# Patient Record
Sex: Male | Born: 1960 | Hispanic: Yes | Smoking: Never smoker
Health system: Southern US, Community
[De-identification: ages and names within clinical notes are randomized; demographics above are authoritative.]

## PROBLEM LIST (undated history)

## (undated) DIAGNOSIS — Z9289 Personal history of other medical treatment: Secondary | ICD-10-CM

## (undated) DIAGNOSIS — N183 Chronic kidney disease, stage 3 unspecified: Secondary | ICD-10-CM

## (undated) DIAGNOSIS — J9819 Other pulmonary collapse: Secondary | ICD-10-CM

## (undated) DIAGNOSIS — I4901 Ventricular fibrillation: Secondary | ICD-10-CM

## (undated) DIAGNOSIS — I82409 Acute embolism and thrombosis of unspecified deep veins of unspecified lower extremity: Secondary | ICD-10-CM

## (undated) DIAGNOSIS — I219 Acute myocardial infarction, unspecified: Secondary | ICD-10-CM

## (undated) DIAGNOSIS — N189 Chronic kidney disease, unspecified: Secondary | ICD-10-CM

## (undated) DIAGNOSIS — I472 Ventricular tachycardia, unspecified: Secondary | ICD-10-CM

## (undated) DIAGNOSIS — G43909 Migraine, unspecified, not intractable, without status migrainosus: Secondary | ICD-10-CM

## (undated) DIAGNOSIS — R011 Cardiac murmur, unspecified: Secondary | ICD-10-CM

## (undated) DIAGNOSIS — I2510 Atherosclerotic heart disease of native coronary artery without angina pectoris: Secondary | ICD-10-CM

## (undated) DIAGNOSIS — G4730 Sleep apnea, unspecified: Secondary | ICD-10-CM

## (undated) DIAGNOSIS — I422 Other hypertrophic cardiomyopathy: Secondary | ICD-10-CM

## (undated) DIAGNOSIS — R197 Diarrhea, unspecified: Secondary | ICD-10-CM

## (undated) DIAGNOSIS — E781 Pure hyperglyceridemia: Secondary | ICD-10-CM

## (undated) DIAGNOSIS — J189 Pneumonia, unspecified organism: Secondary | ICD-10-CM

## (undated) DIAGNOSIS — T4145XA Adverse effect of unspecified anesthetic, initial encounter: Secondary | ICD-10-CM

## (undated) DIAGNOSIS — R51 Headache: Secondary | ICD-10-CM

## (undated) DIAGNOSIS — A6920 Lyme disease, unspecified: Secondary | ICD-10-CM

## (undated) DIAGNOSIS — I5030 Unspecified diastolic (congestive) heart failure: Secondary | ICD-10-CM

## (undated) DIAGNOSIS — S46211A Strain of muscle, fascia and tendon of other parts of biceps, right arm, initial encounter: Secondary | ICD-10-CM

## (undated) DIAGNOSIS — D751 Secondary polycythemia: Secondary | ICD-10-CM

## (undated) DIAGNOSIS — M109 Gout, unspecified: Secondary | ICD-10-CM

## (undated) DIAGNOSIS — Z95810 Presence of automatic (implantable) cardiac defibrillator: Secondary | ICD-10-CM

## (undated) DIAGNOSIS — K219 Gastro-esophageal reflux disease without esophagitis: Secondary | ICD-10-CM

## (undated) DIAGNOSIS — R4020 Unspecified coma: Secondary | ICD-10-CM

## (undated) DIAGNOSIS — T8859XA Other complications of anesthesia, initial encounter: Secondary | ICD-10-CM

## (undated) HISTORY — DX: Secondary polycythemia: D75.1

## (undated) HISTORY — DX: Chronic kidney disease, unspecified: N18.9

## (undated) HISTORY — DX: Pure hyperglyceridemia: E78.1

## (undated) HISTORY — DX: Ventricular tachycardia, unspecified: I47.20

## (undated) HISTORY — DX: Diarrhea, unspecified: R19.7

## (undated) HISTORY — PX: ABDOMINAL HERNIA REPAIR: SHX539

## (undated) HISTORY — DX: Unspecified diastolic (congestive) heart failure: I50.30

## (undated) HISTORY — PX: CARDIAC DEFIBRILLATOR PLACEMENT: SHX171

## (undated) HISTORY — DX: Ventricular tachycardia: I47.2

## (undated) HISTORY — PX: MYOMECTOMY: SHX85

## (undated) HISTORY — PX: HERNIA REPAIR: SHX51

---

## 1985-11-14 DIAGNOSIS — A6920 Lyme disease, unspecified: Secondary | ICD-10-CM

## 1985-11-14 HISTORY — DX: Lyme disease, unspecified: A69.20

## 1989-11-14 DIAGNOSIS — J9819 Other pulmonary collapse: Secondary | ICD-10-CM

## 1989-11-14 DIAGNOSIS — R4020 Unspecified coma: Secondary | ICD-10-CM

## 1989-11-14 DIAGNOSIS — Z9289 Personal history of other medical treatment: Secondary | ICD-10-CM

## 1989-11-14 HISTORY — PX: NEPHRECTOMY: SHX65

## 1989-11-14 HISTORY — DX: Other pulmonary collapse: J98.19

## 1989-11-14 HISTORY — PX: COLOSTOMY: SHX63

## 1989-11-14 HISTORY — PX: EXPLORATORY LAPAROTOMY: SUR591

## 1989-11-14 HISTORY — PX: PARTIAL COLECTOMY: SHX5273

## 1989-11-14 HISTORY — DX: Personal history of other medical treatment: Z92.89

## 1989-11-14 HISTORY — DX: Unspecified coma: R40.20

## 1990-07-15 HISTORY — PX: COLOSTOMY REVERSAL: SHX5782

## 1993-11-14 DIAGNOSIS — J189 Pneumonia, unspecified organism: Secondary | ICD-10-CM

## 1993-11-14 HISTORY — DX: Pneumonia, unspecified organism: J18.9

## 1997-11-14 DIAGNOSIS — S46211A Strain of muscle, fascia and tendon of other parts of biceps, right arm, initial encounter: Secondary | ICD-10-CM

## 1997-11-14 HISTORY — DX: Strain of muscle, fascia and tendon of other parts of biceps, right arm, initial encounter: S46.211A

## 2004-11-14 DIAGNOSIS — I219 Acute myocardial infarction, unspecified: Secondary | ICD-10-CM

## 2004-11-14 HISTORY — DX: Acute myocardial infarction, unspecified: I21.9

## 2005-12-13 ENCOUNTER — Emergency Department (HOSPITAL_COMMUNITY): Admission: EM | Admit: 2005-12-13 | Discharge: 2005-12-13 | Payer: Self-pay | Admitting: Emergency Medicine

## 2009-04-26 ENCOUNTER — Emergency Department (HOSPITAL_COMMUNITY): Admission: EM | Admit: 2009-04-26 | Discharge: 2009-04-26 | Payer: Self-pay | Admitting: Emergency Medicine

## 2009-05-16 ENCOUNTER — Encounter: Payer: Self-pay | Admitting: Emergency Medicine

## 2009-05-16 ENCOUNTER — Observation Stay (HOSPITAL_COMMUNITY): Admission: EM | Admit: 2009-05-16 | Discharge: 2009-05-18 | Payer: Self-pay | Admitting: Emergency Medicine

## 2009-05-17 ENCOUNTER — Encounter (INDEPENDENT_AMBULATORY_CARE_PROVIDER_SITE_OTHER): Payer: Self-pay | Admitting: Interventional Cardiology

## 2009-05-20 ENCOUNTER — Inpatient Hospital Stay (HOSPITAL_COMMUNITY): Admission: EM | Admit: 2009-05-20 | Discharge: 2009-05-22 | Payer: Self-pay | Admitting: Emergency Medicine

## 2009-06-08 ENCOUNTER — Emergency Department (HOSPITAL_COMMUNITY): Admission: EM | Admit: 2009-06-08 | Discharge: 2009-06-08 | Payer: Self-pay | Admitting: Emergency Medicine

## 2009-06-14 ENCOUNTER — Ambulatory Visit: Payer: Self-pay | Admitting: Cardiology

## 2009-06-14 ENCOUNTER — Inpatient Hospital Stay: Payer: Self-pay | Admitting: Internal Medicine

## 2009-06-15 ENCOUNTER — Encounter: Payer: Self-pay | Admitting: Cardiology

## 2009-06-18 ENCOUNTER — Encounter: Payer: Self-pay | Admitting: Cardiology

## 2009-06-27 DIAGNOSIS — I5032 Chronic diastolic (congestive) heart failure: Secondary | ICD-10-CM | POA: Insufficient documentation

## 2009-06-27 DIAGNOSIS — E781 Pure hyperglyceridemia: Secondary | ICD-10-CM | POA: Insufficient documentation

## 2009-06-27 DIAGNOSIS — I10 Essential (primary) hypertension: Secondary | ICD-10-CM

## 2009-07-07 ENCOUNTER — Emergency Department (HOSPITAL_COMMUNITY): Admission: EM | Admit: 2009-07-07 | Discharge: 2009-07-07 | Payer: Self-pay | Admitting: Emergency Medicine

## 2009-10-11 ENCOUNTER — Emergency Department (HOSPITAL_COMMUNITY): Admission: EM | Admit: 2009-10-11 | Discharge: 2009-10-11 | Payer: Self-pay | Admitting: Emergency Medicine

## 2009-10-27 ENCOUNTER — Inpatient Hospital Stay: Payer: Self-pay | Admitting: Internal Medicine

## 2009-10-27 ENCOUNTER — Ambulatory Visit: Payer: Self-pay | Admitting: Cardiovascular Disease

## 2010-08-26 ENCOUNTER — Emergency Department (HOSPITAL_COMMUNITY): Admission: EM | Admit: 2010-08-26 | Discharge: 2010-08-26 | Payer: Self-pay | Admitting: Emergency Medicine

## 2010-08-29 ENCOUNTER — Emergency Department (HOSPITAL_COMMUNITY): Admission: EM | Admit: 2010-08-29 | Discharge: 2010-08-29 | Payer: Self-pay | Admitting: Emergency Medicine

## 2010-11-14 LAB — HM COLONOSCOPY: HM Colonoscopy: NORMAL

## 2011-01-26 LAB — DIFFERENTIAL
Basophils Relative: 0 % (ref 0–1)
Eosinophils Absolute: 0.1 10*3/uL (ref 0.0–0.7)
Eosinophils Relative: 2 % (ref 0–5)
Monocytes Absolute: 0.3 10*3/uL (ref 0.1–1.0)
Monocytes Relative: 6 % (ref 3–12)

## 2011-01-26 LAB — URINALYSIS, ROUTINE W REFLEX MICROSCOPIC
Bilirubin Urine: NEGATIVE
Glucose, UA: NEGATIVE mg/dL
Ketones, ur: NEGATIVE mg/dL
Nitrite: NEGATIVE
Protein, ur: NEGATIVE mg/dL
pH: 5.5 (ref 5.0–8.0)

## 2011-01-26 LAB — CBC
HCT: 51.5 % (ref 39.0–52.0)
Hemoglobin: 18.4 g/dL — ABNORMAL HIGH (ref 13.0–17.0)
MCH: 38.1 pg — ABNORMAL HIGH (ref 26.0–34.0)
MCHC: 35.8 g/dL (ref 30.0–36.0)
RDW: 11.8 % (ref 11.5–15.5)

## 2011-01-26 LAB — BASIC METABOLIC PANEL
BUN: 14 mg/dL (ref 6–23)
CO2: 23 mEq/L (ref 19–32)
Glucose, Bld: 114 mg/dL — ABNORMAL HIGH (ref 70–99)
Potassium: 4.1 mEq/L (ref 3.5–5.1)
Sodium: 138 mEq/L (ref 135–145)

## 2011-01-30 ENCOUNTER — Emergency Department (HOSPITAL_COMMUNITY)
Admission: EM | Admit: 2011-01-30 | Discharge: 2011-01-30 | Disposition: A | Discharge status: Nursing Home (Includes ICF) | Payer: Managed Care, Other (non HMO) | Attending: Emergency Medicine | Admitting: Emergency Medicine

## 2011-01-30 ENCOUNTER — Inpatient Hospital Stay (HOSPITAL_COMMUNITY)
Admission: EM | Admit: 2011-01-30 | Discharge: 2011-02-02 | DRG: 287 | Disposition: A | Discharge status: Home / Self Care | Payer: Medicare Other | Source: Other Acute Inpatient Hospital | Attending: Interventional Cardiology | Admitting: Interventional Cardiology

## 2011-01-30 ENCOUNTER — Emergency Department (HOSPITAL_COMMUNITY): Payer: Managed Care, Other (non HMO)

## 2011-01-30 DIAGNOSIS — R0609 Other forms of dyspnea: Secondary | ICD-10-CM

## 2011-01-30 DIAGNOSIS — T380X5A Adverse effect of glucocorticoids and synthetic analogues, initial encounter: Secondary | ICD-10-CM | POA: Diagnosis present

## 2011-01-30 DIAGNOSIS — Z880 Allergy status to penicillin: Secondary | ICD-10-CM

## 2011-01-30 DIAGNOSIS — I509 Heart failure, unspecified: Secondary | ICD-10-CM | POA: Diagnosis present

## 2011-01-30 DIAGNOSIS — I5032 Chronic diastolic (congestive) heart failure: Secondary | ICD-10-CM | POA: Diagnosis present

## 2011-01-30 DIAGNOSIS — R0602 Shortness of breath: Secondary | ICD-10-CM | POA: Insufficient documentation

## 2011-01-30 DIAGNOSIS — I422 Other hypertrophic cardiomyopathy: Principal | ICD-10-CM | POA: Diagnosis present

## 2011-01-30 DIAGNOSIS — R197 Diarrhea, unspecified: Secondary | ICD-10-CM | POA: Insufficient documentation

## 2011-01-30 DIAGNOSIS — Z95810 Presence of automatic (implantable) cardiac defibrillator: Secondary | ICD-10-CM

## 2011-01-30 DIAGNOSIS — R079 Chest pain, unspecified: Secondary | ICD-10-CM

## 2011-01-30 DIAGNOSIS — R0789 Other chest pain: Secondary | ICD-10-CM | POA: Insufficient documentation

## 2011-01-30 DIAGNOSIS — I214 Non-ST elevation (NSTEMI) myocardial infarction: Secondary | ICD-10-CM | POA: Insufficient documentation

## 2011-01-30 DIAGNOSIS — E781 Pure hyperglyceridemia: Secondary | ICD-10-CM | POA: Diagnosis present

## 2011-01-30 DIAGNOSIS — N189 Chronic kidney disease, unspecified: Secondary | ICD-10-CM | POA: Diagnosis present

## 2011-01-30 DIAGNOSIS — R0989 Other specified symptoms and signs involving the circulatory and respiratory systems: Secondary | ICD-10-CM

## 2011-01-30 DIAGNOSIS — R110 Nausea: Secondary | ICD-10-CM | POA: Insufficient documentation

## 2011-01-30 DIAGNOSIS — Z905 Acquired absence of kidney: Secondary | ICD-10-CM

## 2011-01-30 DIAGNOSIS — I2510 Atherosclerotic heart disease of native coronary artery without angina pectoris: Secondary | ICD-10-CM | POA: Insufficient documentation

## 2011-01-30 DIAGNOSIS — I129 Hypertensive chronic kidney disease with stage 1 through stage 4 chronic kidney disease, or unspecified chronic kidney disease: Secondary | ICD-10-CM | POA: Diagnosis present

## 2011-01-30 LAB — COMPREHENSIVE METABOLIC PANEL
AST: 35 U/L (ref 0–37)
Albumin: 3.9 g/dL (ref 3.5–5.2)
Calcium: 8.9 mg/dL (ref 8.4–10.5)
Chloride: 107 mEq/L (ref 96–112)
Creatinine, Ser: 1.57 mg/dL — ABNORMAL HIGH (ref 0.4–1.5)
GFR calc Af Amer: 57 mL/min — ABNORMAL LOW (ref >60)

## 2011-01-30 LAB — DIFFERENTIAL
Basophils Absolute: 0 10*3/uL (ref 0.0–0.1)
Eosinophils Absolute: 0 10*3/uL (ref 0.0–0.7)
Lymphs Abs: 1.1 10*3/uL (ref 0.7–4.0)
Monocytes Absolute: 0.5 10*3/uL (ref 0.1–1.0)
Neutro Abs: 3 10*3/uL (ref 1.7–7.7)

## 2011-01-30 LAB — CARDIAC PANEL(CRET KIN+CKTOT+MB+TROPI): Relative Index: 3.4 — ABNORMAL HIGH (ref 0.0–2.5)

## 2011-01-30 LAB — BRAIN NATRIURETIC PEPTIDE: Pro B Natriuretic peptide (BNP): 246 pg/mL — ABNORMAL HIGH (ref 0.0–100.0)

## 2011-01-30 LAB — POCT CARDIAC MARKERS
CKMB, poc: 9.6 ng/mL (ref 1.0–8.0)
Myoglobin, poc: 266 ng/mL (ref 12–200)

## 2011-01-30 LAB — URINALYSIS, ROUTINE W REFLEX MICROSCOPIC
Glucose, UA: NEGATIVE mg/dL
Ketones, ur: NEGATIVE mg/dL
Leukocytes, UA: NEGATIVE
pH: 6.5 (ref 5.0–8.0)

## 2011-01-30 LAB — CBC
Hemoglobin: 16.4 g/dL (ref 13.0–17.0)
MCH: 40 pg — ABNORMAL HIGH (ref 26.0–34.0)
MCHC: 36 g/dL (ref 30.0–36.0)
RDW: 12.1 % (ref 11.5–15.5)

## 2011-01-30 LAB — URINE MICROSCOPIC-ADD ON

## 2011-01-30 LAB — PROTIME-INR: INR: 0.96 (ref 0.00–1.49)

## 2011-01-30 LAB — CK TOTAL AND CKMB (NOT AT ARMC): Relative Index: 3.3 — ABNORMAL HIGH (ref 0.0–2.5)

## 2011-01-31 LAB — CARDIAC PANEL(CRET KIN+CKTOT+MB+TROPI)
CK, MB: 19.1 ng/mL (ref 0.3–4.0)
CK, MB: 21.5 ng/mL (ref 0.3–4.0)
Relative Index: 3.7 — ABNORMAL HIGH (ref 0.0–2.5)
Relative Index: 4.5 — ABNORMAL HIGH (ref 0.0–2.5)
Total CK: 522 U/L — ABNORMAL HIGH (ref 7–232)
Total CK: 479 U/L — ABNORMAL HIGH (ref 7–232)

## 2011-01-31 LAB — HEPARIN LEVEL (UNFRACTIONATED): Heparin Unfractionated: 0.11 IU/mL — ABNORMAL LOW (ref 0.30–0.70)

## 2011-01-31 LAB — CBC
HCT: 43.5 % (ref 39.0–52.0)
Hemoglobin: 15.9 g/dL (ref 13.0–17.0)
MCH: 39.8 pg — ABNORMAL HIGH (ref 26.0–34.0)
MCHC: 36.6 g/dL — ABNORMAL HIGH (ref 30.0–36.0)
MCV: 108.8 fL — ABNORMAL HIGH (ref 78.0–100.0)
Platelets: 92 K/uL — ABNORMAL LOW (ref 150–400)
RBC: 4 MIL/uL — ABNORMAL LOW (ref 4.22–5.81)
RDW: 11.7 % (ref 11.5–15.5)
WBC: 4.3 K/uL (ref 4.0–10.5)

## 2011-01-31 LAB — URINE CULTURE
Colony Count: NO GROWTH
Culture  Setup Time: 201203182018
Culture: NO GROWTH

## 2011-01-31 LAB — MRSA PCR SCREENING: MRSA by PCR: NEGATIVE

## 2011-01-31 LAB — BASIC METABOLIC PANEL
Chloride: 109 mEq/L (ref 96–112)
GFR calc Af Amer: 58 mL/min — ABNORMAL LOW (ref >60)
Potassium: 3.7 mEq/L (ref 3.5–5.1)
Sodium: 137 mEq/L (ref 135–145)

## 2011-01-31 LAB — LIPID PANEL
HDL: 21 mg/dL — ABNORMAL LOW
Total CHOL/HDL Ratio: 7.6 ratio
Triglycerides: 223 mg/dL — ABNORMAL HIGH
VLDL: 45 mg/dL — ABNORMAL HIGH (ref 0–40)

## 2011-01-31 LAB — VITAMIN B12: Vitamin B-12: 320 pg/mL (ref 211–911)

## 2011-02-01 LAB — CK TOTAL AND CKMB (NOT AT ARMC)
CK, MB: 15.4 ng/mL (ref 0.3–4.0)
Relative Index: 6.5 — ABNORMAL HIGH (ref 0.0–2.5)

## 2011-02-01 LAB — BASIC METABOLIC PANEL
Calcium: 8.9 mg/dL (ref 8.4–10.5)
GFR calc Af Amer: 57 mL/min — ABNORMAL LOW (ref >60)
GFR calc non Af Amer: 47 mL/min — ABNORMAL LOW (ref >60)
Glucose, Bld: 172 mg/dL — ABNORMAL HIGH (ref 70–99)
Potassium: 3.8 mEq/L (ref 3.5–5.1)
Sodium: 135 mEq/L (ref 135–145)

## 2011-02-01 LAB — TROPONIN I: Troponin I: 0.11 ng/mL — ABNORMAL HIGH (ref 0.00–0.06)

## 2011-02-01 LAB — CBC
HCT: 46.3 % (ref 39.0–52.0)
Hemoglobin: 16.8 g/dL (ref 13.0–17.0)
RDW: 11.5 % (ref 11.5–15.5)
WBC: 8.1 10*3/uL (ref 4.0–10.5)

## 2011-02-02 LAB — BASIC METABOLIC PANEL
BUN: 16 mg/dL (ref 6–23)
CO2: 30 mEq/L (ref 19–32)
Calcium: 9 mg/dL (ref 8.4–10.5)
Creatinine, Ser: 1.54 mg/dL — ABNORMAL HIGH (ref 0.4–1.5)
GFR calc non Af Amer: 48 mL/min — ABNORMAL LOW (ref >60)
Glucose, Bld: 86 mg/dL (ref 70–99)
Sodium: 140 mEq/L (ref 135–145)

## 2011-02-02 LAB — CK TOTAL AND CKMB (NOT AT ARMC): Relative Index: 9.3 — ABNORMAL HIGH (ref 0.0–2.5)

## 2011-02-16 LAB — DIFFERENTIAL
Basophils Absolute: 0 10*3/uL (ref 0.0–0.1)
Eosinophils Relative: 3 % (ref 0–5)
Lymphocytes Relative: 35 % (ref 12–46)
Lymphs Abs: 1.3 10*3/uL (ref 0.7–4.0)
Monocytes Absolute: 0.2 10*3/uL (ref 0.1–1.0)
Monocytes Relative: 6 % (ref 3–12)
Neutro Abs: 2.2 10*3/uL (ref 1.7–7.7)

## 2011-02-16 LAB — COMPREHENSIVE METABOLIC PANEL
AST: 48 U/L — ABNORMAL HIGH (ref 0–37)
Albumin: 4.1 g/dL (ref 3.5–5.2)
BUN: 26 mg/dL — ABNORMAL HIGH (ref 6–23)
Calcium: 9.1 mg/dL (ref 8.4–10.5)
Chloride: 107 mEq/L (ref 96–112)
Creatinine, Ser: 1.2 mg/dL (ref 0.4–1.5)
GFR calc Af Amer: >60 mL/min (ref >60)
Total Protein: 6.5 g/dL (ref 6.0–8.3)

## 2011-02-16 LAB — CBC
HCT: 41.5 % (ref 39.0–52.0)
MCV: 109 fL — ABNORMAL HIGH (ref 78.0–100.0)
Platelets: 104 10*3/uL — ABNORMAL LOW (ref 150–400)
RDW: 12.3 % (ref 11.5–15.5)
WBC: 3.9 10*3/uL — ABNORMAL LOW (ref 4.0–10.5)

## 2011-02-20 LAB — POCT I-STAT, CHEM 8
Calcium, Ion: 1.26 mmol/L (ref 1.12–1.32)
Calcium, Ion: 1.1 mmol/L — ABNORMAL LOW (ref 1.12–1.32)
Chloride: 110 mEq/L (ref 96–112)
Creatinine, Ser: 1.8 mg/dL — ABNORMAL HIGH (ref 0.4–1.5)
Glucose, Bld: 104 mg/dL — ABNORMAL HIGH (ref 70–99)
Glucose, Bld: 111 mg/dL — ABNORMAL HIGH (ref 70–99)
HCT: 43 % (ref 39.0–52.0)
Hemoglobin: 18 g/dL — ABNORMAL HIGH (ref 13.0–17.0)
Potassium: 4.4 mEq/L (ref 3.5–5.1)
TCO2: 21 mmol/L (ref 0–100)
TCO2: 23 mmol/L (ref 0–100)

## 2011-02-20 LAB — CARDIAC PANEL(CRET KIN+CKTOT+MB+TROPI)
CK, MB: 12.1 ng/mL — ABNORMAL HIGH (ref 0.3–4.0)
CK, MB: 21.2 ng/mL — ABNORMAL HIGH (ref 0.3–4.0)
Relative Index: 3.4 — ABNORMAL HIGH (ref 0.0–2.5)
Relative Index: 3.8 — ABNORMAL HIGH (ref 0.0–2.5)
Relative Index: 4.3 — ABNORMAL HIGH (ref 0.0–2.5)
Total CK: 355 U/L — ABNORMAL HIGH (ref 7–232)
Total CK: 356 U/L — ABNORMAL HIGH (ref 7–232)
Total CK: 450 U/L — ABNORMAL HIGH (ref 7–232)
Total CK: 515 U/L — ABNORMAL HIGH (ref 7–232)
Troponin I: 0.06 ng/mL (ref 0.00–0.06)
Troponin I: 0.07 ng/mL — ABNORMAL HIGH (ref 0.00–0.06)
Troponin I: 0.11 ng/mL — ABNORMAL HIGH (ref 0.00–0.06)
Troponin I: 0.16 ng/mL — ABNORMAL HIGH (ref 0.00–0.06)
Troponin I: 0.24 ng/mL — ABNORMAL HIGH (ref 0.00–0.06)

## 2011-02-20 LAB — COMPREHENSIVE METABOLIC PANEL
ALT: 48 U/L (ref 0–53)
AST: 54 U/L — ABNORMAL HIGH (ref 0–37)
Albumin: 3.9 g/dL (ref 3.5–5.2)
Alkaline Phosphatase: 40 U/L (ref 39–117)
Potassium: 4 mEq/L (ref 3.5–5.1)
Sodium: 135 mEq/L (ref 135–145)
Total Protein: 6.6 g/dL (ref 6.0–8.3)

## 2011-02-20 LAB — BASIC METABOLIC PANEL
BUN: 20 mg/dL (ref 6–23)
CO2: 22 mEq/L (ref 19–32)
CO2: 24 mEq/L (ref 19–32)
CO2: 29 mEq/L (ref 19–32)
Calcium: 9.1 mg/dL (ref 8.4–10.5)
Calcium: 9.6 mg/dL (ref 8.4–10.5)
Chloride: 101 mEq/L (ref 96–112)
Chloride: 103 mEq/L (ref 96–112)
Chloride: 99 mEq/L (ref 96–112)
Creatinine, Ser: 1.8 mg/dL — ABNORMAL HIGH (ref 0.4–1.5)
Creatinine, Ser: 1.85 mg/dL — ABNORMAL HIGH (ref 0.4–1.5)
Creatinine, Ser: 1.9 mg/dL — ABNORMAL HIGH (ref 0.4–1.5)
GFR calc Af Amer: 46 mL/min — ABNORMAL LOW (ref >60)
GFR calc Af Amer: 48 mL/min — ABNORMAL LOW (ref >60)
GFR calc Af Amer: 52 mL/min — ABNORMAL LOW (ref >60)
GFR calc non Af Amer: 39 mL/min — ABNORMAL LOW (ref >60)
Sodium: 135 mEq/L (ref 135–145)
Sodium: 135 mEq/L (ref 135–145)

## 2011-02-20 LAB — CBC
HCT: 44.7 % (ref 39.0–52.0)
HCT: 48.6 % (ref 39.0–52.0)
HCT: 50.1 % (ref 39.0–52.0)
Hemoglobin: 16 g/dL (ref 13.0–17.0)
Hemoglobin: 16.9 g/dL (ref 13.0–17.0)
MCHC: 35.8 g/dL (ref 30.0–36.0)
MCHC: 34.7 g/dL (ref 30.0–36.0)
MCV: 108.1 fL — ABNORMAL HIGH (ref 78.0–100.0)
MCV: 112.3 fL — ABNORMAL HIGH (ref 78.0–100.0)
MCV: 112.7 fL — ABNORMAL HIGH (ref 78.0–100.0)
MCV: 112.1 fL — ABNORMAL HIGH (ref 78.0–100.0)
Platelets: 124 10*3/uL — ABNORMAL LOW (ref 150–400)
Platelets: 126 10*3/uL — ABNORMAL LOW (ref 150–400)
RBC: 4.14 MIL/uL — ABNORMAL LOW (ref 4.22–5.81)
RBC: 4.33 MIL/uL (ref 4.22–5.81)
RBC: 4.36 MIL/uL (ref 4.22–5.81)
RBC: 4.48 MIL/uL (ref 4.22–5.81)
RDW: 12 % (ref 11.5–15.5)
RDW: 12.4 % (ref 11.5–15.5)
RDW: 12.3 % (ref 11.5–15.5)
WBC: 3.8 10*3/uL — ABNORMAL LOW (ref 4.0–10.5)
WBC: 3.9 10*3/uL — ABNORMAL LOW (ref 4.0–10.5)
WBC: 5 10*3/uL (ref 4.0–10.5)

## 2011-02-20 LAB — PROTIME-INR
Prothrombin Time: 13.5 seconds (ref 11.6–15.2)
Prothrombin Time: 12.4 seconds (ref 11.6–15.2)

## 2011-02-20 LAB — DIFFERENTIAL
Basophils Relative: 0 % (ref 0–1)
Basophils Relative: 0 % (ref 0–1)
Basophils Relative: 1 % (ref 0–1)
Eosinophils Absolute: 0.1 10*3/uL (ref 0.0–0.7)
Eosinophils Absolute: 0.2 10*3/uL (ref 0.0–0.7)
Eosinophils Relative: 3 % (ref 0–5)
Eosinophils Relative: 4 % (ref 0–5)
Lymphs Abs: 0.9 10*3/uL (ref 0.7–4.0)
Monocytes Absolute: 0.3 10*3/uL (ref 0.1–1.0)
Monocytes Absolute: 0.3 10*3/uL (ref 0.1–1.0)
Monocytes Absolute: 0.4 10*3/uL (ref 0.1–1.0)
Monocytes Relative: 7 % (ref 3–12)
Monocytes Relative: 7 % (ref 3–12)
Neutro Abs: 2.7 10*3/uL (ref 1.7–7.7)
Neutro Abs: 2.7 10*3/uL (ref 1.7–7.7)

## 2011-02-20 LAB — CK TOTAL AND CKMB (NOT AT ARMC)
CK, MB: 22.3 ng/mL — ABNORMAL HIGH (ref 0.3–4.0)
Relative Index: 3.7 — ABNORMAL HIGH (ref 0.0–2.5)

## 2011-02-20 LAB — POCT CARDIAC MARKERS
CKMB, poc: 6.8 ng/mL (ref 1.0–8.0)
CKMB, poc: 10.8 ng/mL (ref 1.0–8.0)
CKMB, poc: 14.8 ng/mL (ref 1.0–8.0)
Myoglobin, poc: 153 ng/mL (ref 12–200)
Troponin i, poc: <0.05 ng/mL (ref 0.00–0.09)
Troponin i, poc: <0.05 ng/mL (ref 0.00–0.09)
Troponin i, poc: <0.05 ng/mL (ref 0.00–0.09)

## 2011-02-20 LAB — APTT
aPTT: 27 seconds (ref 24–37)
aPTT: 26 seconds (ref 24–37)

## 2011-02-20 LAB — TROPONIN I: Troponin I: 0.1 ng/mL — ABNORMAL HIGH (ref 0.00–0.06)

## 2011-02-20 LAB — TSH: TSH: 1.219 u[IU]/mL (ref 0.350–4.500)

## 2011-02-20 LAB — LIPID PANEL

## 2011-02-21 LAB — URINALYSIS, ROUTINE W REFLEX MICROSCOPIC
Bilirubin Urine: NEGATIVE
Hgb urine dipstick: NEGATIVE
Ketones, ur: NEGATIVE mg/dL
Nitrite: NEGATIVE
pH: 5.5 (ref 5.0–8.0)

## 2011-03-02 NOTE — Cardiovascular Report (Signed)
NAMENIKOLA, MARONE               ACCOUNT NO.:  [id number]  MEDICAL RECORD NO.:  [id number]           PATIENT TYPE:  I  LOCATION:  2918                         FACILITY:  [facility]  PHYSICIAN:  Corky Crafts, MDDATE OF BIRTH:  [birth year]  DATE OF PROCEDURE:  01/31/2011 DATE OF DISCHARGE:                           CARDIAC CATHETERIZATION   PRIMARY CARDIOLOGIST:  Lyn Records, MD  PROCEDURES PERFORMED:  Left heart catheterization, coronary angiogram.  OPERATOR:  Corky Crafts, MD  INDICATIONS:  Non-ST-elevation MI.  PROCEDURE NARRATIVE:  The risks and benefits of cardiac catheterization were explained to the patient and informed consent was obtained.  He was brought to the Cath Lab.  He was prepped and draped in the usual sterile fashion.  His right wrist was infiltrated with 1% lidocaine.  A 5-French glide sheath was placed into the right radial artery using the modified Seldinger technique.  Right coronary artery angiography was performed using a JR-4 pigtail catheter.  Catheter was advanced to the vessel ostium under fluoroscopic guidance.  Digital angiography was performed in multiple projections using hand injection of contrast.  Left coronary artery angiography was stented with a JL-3.5 catheter.  Several views were obtained, but we attempted to get better opacification of the vessels.  We tried a JL-4 with no success.  We went back again with a JL- 3.5 with no success.  Eventually, a CLS 3 guiding catheter gave [place] better visualization of the left system.  Digital angiography was performed in a cranial projection to image the LAD.  Subsequently, the RCA catheter was used to cross the aortic valve and obtain a pullback under continuous hemodynamic pressure monitoring.  Ventriculography was not performed because of the patient's renal insufficiency.  FINDINGS:  The right coronary artery is a large dominant vessel.  There is a large PDA feeding septal vessel.  The  entire right coronary artery system appears angiographically normal. The left main is widely patent. Left circumflex is a large vessel.  There is a small OM-1.  There is a large OM-2, but the entire circumflex system appears widely patent. Left anterior descending is a large vessel, which wraps around the apex. There are two diagonal vessels.  The first is small and the second is large, both of these are widely patent as is the LAD.  HEMODYNAMICS:  Left ventricular pressure 108/16 with an LVEDP of 35. Aortic pressure 109/80 with a mean aortic pressure of 92.  Of note, post PVC, left ventricular pressure was essentially equal to the pre PVC left ventricular pressure.  There was no significant Brockenbrough-Braunwald sign.  IMPRESSION: 1. No significant coronary artery disease. 2. Significantly increased left ventricular end-diastolic pressure.  RECOMMENDATIONS:  We will hold aggressive postcath hydration given the elevated LVEDP.  We will treat with Lasix, this will hopefully help with his shortness of breath.  The contrast given was 40 mL.  He will be watched overnight and hopefully discharge tomorrow.     Corky Crafts, MD     JSV/MEDQ  D:  01/31/2011  T:  02/01/2011  Job:  [id number]  Electronically Signed by Lance Muss MD on  03/02/2011 02:26:19 PM

## 2011-03-04 NOTE — Discharge Summary (Signed)
  [name], [name]               ACCOUNT NO.:  [id number]  MEDICAL RECORD NO.:  [id number]           PATIENT TYPE:  I  LOCATION:  2021                         FACILITY:  [facility]  PHYSICIAN:  Lyn Records, M.D.   DATE OF BIRTH:  1961-03-13  DATE OF ADMISSION:  01/30/2011 DATE OF DISCHARGE:  02/02/2011                              DISCHARGE SUMMARY   REASON FOR ADMISSION TO THE HOSPITAL:  Chest pain.  DISCHARGE DIAGNOSES: 1. Hypertrophic cardiomyopathy.     a.     Status post septal myectomy in 2011.     b.     Normal coronary arteries by catheterization this hospital      admission. 2. Trace elevated cardiac markers due to supply-demand mismatch.     a.     Automatic implantable cardioverter-defibrillator. 3. Testosterone and creatine use. 4. Hypertension. 5. Hypertriglyceridemic.  RECOMMENDATIONS AT DISCHARGE: 1. Discontinue creatine, testosterone, and any other anabolic     substances. 2. Medication regimen:     a.     Metoprolol XL increased to 100 mg twice a day.     b.     Enalapril 10 mg daily.     c.     Imodium 1-2 tablets as needed.     d.     Verapamil SR 240 mg per day.  Follow up with Dr. Verdis Prime in 2 weeks, although the patient never follows up based on previous history.  ACTIVITIES:  As tolerated.  HISTORY AND PHYSICAL AND HOSPITAL COURSE:  The patient has a complicated cardiovascular history.  Please see the admitting history and physical.  He was admitted with chest pain, had trace positive cardiac markers, underwent coronary angiography, and was demonstrated to have widely patent coronaries by Dr. Lance Muss.  The elevated markers were felt to be due to supply-demand mismatch.  It was also felt that the patient's ingestion of anabolic steroids, creatine, and other substances may be aggravating his problem.  Medication adjustments were made including increasing the Toprol-XL to 100 mg twice a day.  He is requested to follow up with  Cardiology here in [place], although on two previous admissions he never kept followup appointments.  He also is seen by his cardiologist in [place].  Prognosis at this time is felt to be guarded because of the patient's inability to understand the potential negative consequences of anabolic steroids and creatine which could be compounding his hypertrophy problem.     Lyn Records, M.D.     HWS/MEDQ  D:  02/02/2011  T:  02/03/2011  Job:  [id number]  Electronically Signed by Verdis Prime M.D. on 03/04/2011 04:54:24 PM

## 2011-03-29 ENCOUNTER — Emergency Department (HOSPITAL_COMMUNITY): Payer: Medicare Other

## 2011-03-29 ENCOUNTER — Inpatient Hospital Stay (HOSPITAL_COMMUNITY)
Admission: EM | Admit: 2011-03-29 | Discharge: 2011-04-01 | Disposition: A | Discharge status: Home / Self Care | Payer: Medicare Other | Source: Home / Self Care | Attending: Interventional Cardiology | Admitting: Interventional Cardiology

## 2011-03-29 DIAGNOSIS — T827XXA Infection and inflammatory reaction due to other cardiac and vascular devices, implants and grafts, initial encounter: Principal | ICD-10-CM | POA: Diagnosis present

## 2011-03-29 DIAGNOSIS — R6520 Severe sepsis without septic shock: Secondary | ICD-10-CM | POA: Diagnosis present

## 2011-03-29 DIAGNOSIS — D45 Polycythemia vera: Secondary | ICD-10-CM | POA: Diagnosis present

## 2011-03-29 DIAGNOSIS — I129 Hypertensive chronic kidney disease with stage 1 through stage 4 chronic kidney disease, or unspecified chronic kidney disease: Secondary | ICD-10-CM | POA: Diagnosis present

## 2011-03-29 DIAGNOSIS — Z79899 Other long term (current) drug therapy: Secondary | ICD-10-CM

## 2011-03-29 DIAGNOSIS — N179 Acute kidney failure, unspecified: Secondary | ICD-10-CM | POA: Diagnosis present

## 2011-03-29 DIAGNOSIS — Z8241 Family history of sudden cardiac death: Secondary | ICD-10-CM

## 2011-03-29 DIAGNOSIS — A419 Sepsis, unspecified organism: Secondary | ICD-10-CM

## 2011-03-29 DIAGNOSIS — I421 Obstructive hypertrophic cardiomyopathy: Secondary | ICD-10-CM | POA: Diagnosis present

## 2011-03-29 DIAGNOSIS — Z905 Acquired absence of kidney: Secondary | ICD-10-CM

## 2011-03-29 DIAGNOSIS — N183 Chronic kidney disease, stage 3 unspecified: Secondary | ICD-10-CM | POA: Diagnosis present

## 2011-03-29 DIAGNOSIS — E875 Hyperkalemia: Secondary | ICD-10-CM

## 2011-03-29 DIAGNOSIS — D696 Thrombocytopenia, unspecified: Secondary | ICD-10-CM | POA: Diagnosis present

## 2011-03-29 DIAGNOSIS — Y831 Surgical operation with implant of artificial internal device as the cause of abnormal reaction of the patient, or of later complication, without mention of misadventure at the time of the procedure: Secondary | ICD-10-CM | POA: Diagnosis present

## 2011-03-29 DIAGNOSIS — I330 Acute and subacute infective endocarditis: Secondary | ICD-10-CM | POA: Diagnosis present

## 2011-03-29 DIAGNOSIS — G894 Chronic pain syndrome: Secondary | ICD-10-CM | POA: Diagnosis present

## 2011-03-29 DIAGNOSIS — N170 Acute kidney failure with tubular necrosis: Secondary | ICD-10-CM

## 2011-03-29 DIAGNOSIS — E785 Hyperlipidemia, unspecified: Secondary | ICD-10-CM | POA: Diagnosis present

## 2011-03-29 DIAGNOSIS — F1210 Cannabis abuse, uncomplicated: Secondary | ICD-10-CM | POA: Diagnosis present

## 2011-03-29 LAB — COMPREHENSIVE METABOLIC PANEL
ALT: 126 U/L — ABNORMAL HIGH (ref 0–53)
AST: 95 U/L — ABNORMAL HIGH (ref 0–37)
Albumin: 4.3 g/dL (ref 3.5–5.2)
Alkaline Phosphatase: 64 U/L (ref 39–117)
Alkaline Phosphatase: 60 U/L (ref 39–117)
BUN: 25 mg/dL — ABNORMAL HIGH (ref 6–23)
CO2: 21 mEq/L (ref 19–32)
Calcium: 9.4 mg/dL (ref 8.4–10.5)
Chloride: 101 mEq/L (ref 96–112)
Creatinine, Ser: 2.35 mg/dL — ABNORMAL HIGH (ref 0.4–1.5)
GFR calc Af Amer: 37 mL/min — ABNORMAL LOW (ref >60)
GFR calc non Af Amer: 30 mL/min — ABNORMAL LOW (ref >60)
Glucose, Bld: 124 mg/dL — ABNORMAL HIGH (ref 70–99)
Glucose, Bld: 108 mg/dL — ABNORMAL HIGH (ref 70–99)
Potassium: 6.6 mEq/L (ref 3.5–5.1)
Potassium: 6.3 mEq/L (ref 3.5–5.1)
Sodium: 136 mEq/L (ref 135–145)
Total Bilirubin: 0.4 mg/dL (ref 0.3–1.2)
Total Protein: 7.5 g/dL (ref 6.0–8.3)

## 2011-03-29 LAB — URINE MICROSCOPIC-ADD ON

## 2011-03-29 LAB — DIFFERENTIAL
Basophils Absolute: 0 10*3/uL (ref 0.0–0.1)
Basophils Relative: 0 % (ref 0–1)
Eosinophils Absolute: 0 10*3/uL (ref 0.0–0.7)
Lymphocytes Relative: 15 % (ref 12–46)
Lymphocytes Relative: 14 % (ref 12–46)
Lymphs Abs: 1.3 10*3/uL (ref 0.7–4.0)
Monocytes Relative: 6 % (ref 3–12)
Neutro Abs: 8 10*3/uL — ABNORMAL HIGH (ref 1.7–7.7)
Neutrophils Relative %: 79 % — ABNORMAL HIGH (ref 43–77)
Neutrophils Relative %: 79 % — ABNORMAL HIGH (ref 43–77)

## 2011-03-29 LAB — RAPID URINE DRUG SCREEN, HOSP PERFORMED
Amphetamines: NOT DETECTED
Barbiturates: NOT DETECTED
Cocaine: NOT DETECTED
Opiates: NOT DETECTED
Tetrahydrocannabinol: POSITIVE — AB

## 2011-03-29 LAB — CBC
HCT: 55.1 % — ABNORMAL HIGH (ref 39.0–52.0)
HCT: 57.3 % — ABNORMAL HIGH (ref 39.0–52.0)
Hemoglobin: 21.3 g/dL (ref 13.0–17.0)
MCH: 38.6 pg — ABNORMAL HIGH (ref 26.0–34.0)
MCHC: 37.2 g/dL — ABNORMAL HIGH (ref 30.0–36.0)
MCV: 104.4 fL — ABNORMAL HIGH (ref 78.0–100.0)
Platelets: 87 10*3/uL — ABNORMAL LOW (ref 150–400)
RBC: 5.28 MIL/uL (ref 4.22–5.81)
WBC: 10.1 10*3/uL (ref 4.0–10.5)

## 2011-03-29 LAB — URINALYSIS, ROUTINE W REFLEX MICROSCOPIC
Glucose, UA: NEGATIVE mg/dL
Ketones, ur: NEGATIVE mg/dL
Leukocytes, UA: NEGATIVE
Nitrite: NEGATIVE
Specific Gravity, Urine: 1.027 (ref 1.005–1.030)
pH: 5.5 (ref 5.0–8.0)

## 2011-03-29 LAB — POCT CARDIAC MARKERS: CKMB, poc: 4.2 ng/mL (ref 1.0–8.0)

## 2011-03-29 LAB — NA AND K (SODIUM & POTASSIUM), RAND UR: Potassium Urine: 142 mEq/L
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
## 2011-03-29 NOTE — Discharge Summary (Signed)
[name] Moreno, [name] Moreno               ACCOUNT NO.:  [id number]   MEDICAL RECORD NO.:  [id number]          PATIENT TYPE:  INP   LOCATION:  2504                         FACILITY:  [facility]   PHYSICIAN:  Lyn Records, M.D.   DATE OF BIRTH:  1961/01/28   DATE OF ADMISSION:  05/20/2009  DATE OF DISCHARGE:  05/22/2009                               DISCHARGE SUMMARY   DISCHARGE DIAGNOSES:  1. Chest pain, noncardiac in nature, normal coronaries by cardiac      catheterization.  2. 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  3. Renal insufficiency, baseline creatinine at 1.8.  4. SHELLFISH allergy.   HOSPITAL COURSE:  [name] Moreno is a 50 year old male patient, who was  originally admitted to the hospital between May 20, 2009, and May 18, 2009, for chest tightness and dyspnea.  He had identified a hypertrophic  obstructive cardiomyopathy with the LV outflow gradient at 50 mmHg.  He  had elevated troponin I of around 0.2 with elevated CK-MB throughout the  hospitalization.  The initial BNP was greater than 600 and subsequently  resolved to less than 400 with gentle diuresis.  His dyspnea and chest  tightness are secondary to acute-on-chronic diastolic heart failure as  well as hypertrophic cardiomyopathy.  He was discharged to home at that  time with instructions to follow up with his doctor, Dr. Emilee Hero in [place], [place], phone number [phone].   The patient came back into the hospital for more shortness of breath and  chest pain, and he had had some exertional dyspnea.  These symptoms were  identical to his last admission.   During this admission, he ultimately underwent heart catheterization.  He did have SHELLFISH allergy.  He was treated with PPI, steroids,  Benadryl before the procedure.  He tolerated the procedure well.  He had  normal coronaries.  His LVEDP was 32 mmHg.  He was diagnosed with IHSS  with diastolic heart failure, and we  sought medical management, was  indicated.   The following day, lab work was surprisingly excellent with his  creatinine of 1.8.  At this point, we feel that he could go home, and I  have made him a follow up appointment to see if he is still in town on  May 29, 2009, at 8:30 a.m.  He would need a BMET prior to this visit,  so we can check his renal function.  Otherwise, if he gets back to  [place], he needs to see Dr. Emilee Hero in 2 weeks.   He is being discharged to home in stable, but improved condition.  He is  to remain on a low-sodium heart-healthy fluid intake diet.  Clean cath  site gently with soap and water.  No scrubbing.  May increase activity  slowly.  No lifting over 10 pounds for 1 week.  No driving for 2 days.   DISCHARGE MEDICATIONS:  Stop enalapril.   He is to take these medications;  1. Verapamil SR 240 mg twice a day.  2. Baby aspirin 81 mg a day.  3.  Metoprolol 200 mg twice a day.  4. Lasix 40 mg a day.  5. Potassium 20 mEq a day.      Guy Franco, P.A.      Lyn Records, M.D.  Electronically Signed    LB/MEDQ  D:  05/22/2009  T:  05/22/2009  Job:  [id number]   cc:   Delene Loll. Emilee Hero, MD

## 2011-03-29 NOTE — Cardiovascular Report (Signed)
NAMECLEMENTE, DEWEY               ACCOUNT NO.:  [id number]   MEDICAL RECORD NO.:  [id number]          PATIENT TYPE:  INP   LOCATION:  2504                         FACILITY:  [facility]   PHYSICIAN:  Lyn Records, M.D.   DATE OF BIRTH:  Aug 09, 1961   DATE OF PROCEDURE:  05/21/2009  DATE OF DISCHARGE:                            CARDIAC CATHETERIZATION   INDICATION:  History of hypertrophic obstructive cardiomyopathy (IHSS)  and recent recurring episodes of chest tightness, dyspnea, orthopnea  with positive cardiac markers and increased BNP.   PROCEDURES PERFORMED:  1. Left heart catheterization.  2. Coronary angiography.  3. No left ventriculography but left ventricular pressures recorded.   DESCRIPTION:  After informed consent, a 6-French sheath was placed in  the right femoral artery using the modified Seldinger technique.  A 6-  [place] A2 multipurpose catheter was used for hemodynamic recordings and  a pullback pressure across the aortic valve.  We also performed left  coronary angiography with multipurpose catheter.  A JR4, 6-French  catheter was used for right coronary angiography.  Hemostasis was  achieved with manual compression.  No complications occurred.   RESULTS:  1. Hemodynamic data.      a.     The aortic pressure 102/70.      b.     Left ventricular pressure 130/32 mmHg.  LVEDP 32 mmHg.  2. Left ventriculography:  Not performed because of the patient's      elevated creatinine and known solitary kidney.  3. Coronary angiography.      a.     Left main coronary:  Large and widely patent.      b.     Left anterior descending coronary:  Large and widely patent.       Two large diagonals arise from it.  No obstructive disease is       noted.      c.     Circumflex artery:  Circumflex is large.  It gives origin to       2 obtuse marginal branches.  No obstruction is noted.      d.     Right coronary:  The right coronary artery is dominant and       normal.   CONCLUSIONS:  1. Normal coronary arteries.  2. Evidence of diastolic heart failure with left ventricular end-      diastolic pressure of 32 mmHg.  We did record a 29 mm gradient on      pullback across the aortic valve.   PLANS:  We will need to treat medically.  We may need to use diuretic  therapy to keep the patient's LVEDP down.  We will intensify beta-  blocker therapy.  We may need to discontinue the ACE inhibitor which is  being used for reno-protection for proteinuria.  Hopeful discharge on  May 22, 2009.      Lyn Records, M.D.  Electronically Signed     HWS/MEDQ  D:  05/21/2009  T:  05/22/2009  Job:  [id number]

## 2011-03-29 NOTE — Discharge Summary (Signed)
[name] Moreno, [name] Moreno               ACCOUNT NO.:  [id number]   MEDICAL RECORD NO.:  [id number]          PATIENT TYPE:  OBV   LOCATION:  2022                         FACILITY:  [facility]   PHYSICIAN:  Lyn Records, M.D.   DATE OF BIRTH:  01-Oct-1961   DATE OF ADMISSION:  05/16/2009  DATE OF DISCHARGE:  05/18/2009                               DISCHARGE SUMMARY   REASON FOR ADMISSION:  Dyspnea and chest tightness.   DISCHARGE DIAGNOSES:  1. Dyspnea and chest tightness secondary to acute on chronic diastolic      heart failure secondary to problem #2.  2. Hypertrophic obstructive cardiomyopathy with LV outflow gradient of      50 mmHg.      a.     LV septal thickness 2.4 cm.      b.     Elevated troponin-I of around 0.2 and elevated CK-MBs       throughout the hospitalization.  3. Initial BNP greater than 600 and subsequently resolved to less than      400 with gentle diuresis.   DISCHARGE PLANS:  The patient will be discharged on his usual  medications, which include the following:  1. Verapamil SR 240 mg twice a day.  2. Metoprolol succinate 100 mg tablets one and half tablets twice a      day.  3. Enalapril 40 mg twice a day.  4. Baby aspirin 81 mg per day.   SPECIFIC NEW DISCHARGE INSTRUCTIONS:  1. Decreased salt intake to less than 2 grams per day.  2. Restrict fluid intake to less than or equal to 2000 mL per day.   ACTIVITY LEVEL:  The patient may resume his usual activities.  If there  is recurrent dyspnea or chest discomfort, he should call immediately or  report to the emergency room.   FOLLOWUP:  The patient is already scheduled to see his physician in [place], [place] on May 25, 2009.   DIAGNOSTIC STUDIES:  A 2-D echocardiogram on May 17, 2009.  A copy of  this report is given to the patient to carry with him to his physician  at home.   DISCHARGE CONDITION:  Improved.   HISTORY, PHYSICAL, AND HOSPITAL COURSE:  [name] Moreno is a 50 year old  gentleman who  reported to the emergency room after a several day history  of orthopnea, dyspnea on exertion, and chest tightness.  For 2 weeks  prior to that, there had been gradual reduction in appetite and  exertional dyspnea.  In the emergency room, the ER staff became  concerned because of the abnormal appearance of his EKG.  The  electrocardiogram revealed prominent voltage with marked repolarization  abnormalities in I, aVL and with an incomplete right bundle in the V-  leads with chronic ST elevation in V1 through V4 consistent with early  repolarization.  There was also left axis deviation.  Initial markers  demonstrated a BNP greater than 600 with mildly abnormal troponin-I and  CK-MB.  He was admitted because of these constellation of clinical  problems.  It was initially felt that he might  be having an acute ST  elevation myocardial infarction.  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  His  list of medications suggested a hypertrophic cardiomyopathy.  He does  have a defibrillator since 2006.  There has never been a discharge from  the defibrillator, which was placed as prophylaxis against sudden death.   After some discussion and also documenting via the patient's history  that previous coronary arteriograms as recently as 2 years ago  demonstrated widely patent coronaries, he was admitted with a diagnosis  of acute-on-chronic diastolic heart failure.  Gentle diuresis with Lasix  was begun as there was gradual improvement in the patient's clinical  symptoms.  His EKG did not reveal any evolutionary changes.  The CK-MB  and troponin levels remained mildly elevated throughout the patient's  hospital stay.  It is felt that this elevation likely represents a  manifestation of his cardiomyopathy with supply demand mismatch rather  than obstructive coronary disease as the etiology.   A copy of the patient's EKG and echo report are  given to him.  He is not  discharged from the hospital on a diuretic.  He was given explicit  instructions to decrease his fluid intake and salt intake.  He is to  drink fluid only when he feels thirsty.  He is to avoid power  drinks/energy drinks.      Lyn Records, M.D.  Electronically Signed     Lyn Records, M.D.  Electronically Signed    HWS/MEDQ  D:  05/18/2009  T:  05/19/2009  Job:  [id number]

## 2011-03-30 DIAGNOSIS — T827XXA Infection and inflammatory reaction due to other cardiac and vascular devices, implants and grafts, initial encounter: Secondary | ICD-10-CM

## 2011-03-30 DIAGNOSIS — I428 Other cardiomyopathies: Secondary | ICD-10-CM

## 2011-03-30 DIAGNOSIS — R7881 Bacteremia: Secondary | ICD-10-CM

## 2011-03-30 DIAGNOSIS — A419 Sepsis, unspecified organism: Secondary | ICD-10-CM

## 2011-03-30 LAB — CBC
MCV: 104 fL — ABNORMAL HIGH (ref 78.0–100.0)
Platelets: 85 10*3/uL — ABNORMAL LOW (ref 150–400)
RBC: 4.52 MIL/uL (ref 4.22–5.81)
RDW: 11.6 % (ref 11.5–15.5)
WBC: 5.6 10*3/uL (ref 4.0–10.5)

## 2011-03-30 LAB — PROTIME-INR
INR: 1 (ref 0.00–1.49)
Prothrombin Time: 13.4 seconds (ref 11.6–15.2)

## 2011-03-30 LAB — DIFFERENTIAL
Basophils Absolute: 0 10*3/uL (ref 0.0–0.1)
Eosinophils Absolute: 0.1 10*3/uL (ref 0.0–0.7)
Lymphs Abs: 1.2 10*3/uL (ref 0.7–4.0)
Monocytes Absolute: 0.5 10*3/uL (ref 0.1–1.0)
Neutrophils Relative %: 68 % (ref 43–77)

## 2011-03-30 LAB — URINE CULTURE
Colony Count: NO GROWTH
Culture  Setup Time: 201205152213

## 2011-03-30 LAB — CARDIAC PANEL(CRET KIN+CKTOT+MB+TROPI)
CK, MB: 9.1 ng/mL (ref 0.3–4.0)
Relative Index: 2.4 (ref 0.0–2.5)
Troponin I: <0.3 ng/mL (ref <0.30)

## 2011-03-30 LAB — LACTIC ACID, PLASMA: Lactic Acid, Venous: 1.6 mmol/L (ref 0.5–2.2)

## 2011-03-30 LAB — BASIC METABOLIC PANEL
BUN: 24 mg/dL — ABNORMAL HIGH (ref 6–23)
Chloride: 103 mEq/L (ref 96–112)
Creatinine, Ser: 2.02 mg/dL — ABNORMAL HIGH (ref 0.4–1.5)
Glucose, Bld: 95 mg/dL (ref 70–99)
Potassium: 3.9 mEq/L (ref 3.5–5.1)

## 2011-03-30 LAB — CHLORIDE, URINE, RANDOM: Chloride Urine: 44 mEq/L

## 2011-03-31 DIAGNOSIS — T827XXA Infection and inflammatory reaction due to other cardiac and vascular devices, implants and grafts, initial encounter: Secondary | ICD-10-CM

## 2011-03-31 LAB — BASIC METABOLIC PANEL
BUN: 19 mg/dL (ref 6–23)
CO2: 25 mEq/L (ref 19–32)
Calcium: 8.7 mg/dL (ref 8.4–10.5)
Chloride: 102 mEq/L (ref 96–112)
Creatinine, Ser: 1.48 mg/dL (ref 0.4–1.5)
GFR calc Af Amer: >60 mL/min (ref >60)
GFR calc non Af Amer: 51 mL/min — ABNORMAL LOW (ref >60)
Glucose, Bld: 85 mg/dL (ref 70–99)
Potassium: 3.9 mEq/L (ref 3.5–5.1)
Sodium: 136 mEq/L (ref 135–145)

## 2011-03-31 LAB — DIFFERENTIAL
Basophils Absolute: 0 10*3/uL (ref 0.0–0.1)
Eosinophils Absolute: 0.1 10*3/uL (ref 0.0–0.7)
Eosinophils Relative: 2 % (ref 0–5)

## 2011-03-31 LAB — CBC
MCHC: 37.6 g/dL — ABNORMAL HIGH (ref 30.0–36.0)
MCV: 100 fL (ref 78.0–100.0)
Platelets: 80 10*3/uL — ABNORMAL LOW (ref 150–400)
RDW: 11.3 % — ABNORMAL LOW (ref 11.5–15.5)
WBC: 4.2 10*3/uL (ref 4.0–10.5)

## 2011-04-01 ENCOUNTER — Inpatient Hospital Stay (HOSPITAL_COMMUNITY): Payer: Medicare Other
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
## 2011-04-03 ENCOUNTER — Emergency Department (HOSPITAL_COMMUNITY): Payer: Medicare Other

## 2011-04-03 ENCOUNTER — Inpatient Hospital Stay (HOSPITAL_COMMUNITY)
Admission: EM | Admit: 2011-04-03 | Discharge: 2011-04-07 | DRG: 260 | Disposition: A | Discharge status: Home Health | Payer: Medicare Other | Attending: Interventional Cardiology | Admitting: Interventional Cardiology

## 2011-04-03 DIAGNOSIS — R0789 Other chest pain: Secondary | ICD-10-CM

## 2011-04-03 DIAGNOSIS — R0602 Shortness of breath: Secondary | ICD-10-CM

## 2011-04-04 ENCOUNTER — Inpatient Hospital Stay (HOSPITAL_COMMUNITY): Payer: Medicare Other

## 2011-04-04 DIAGNOSIS — T827XXA Infection and inflammatory reaction due to other cardiac and vascular devices, implants and grafts, initial encounter: Secondary | ICD-10-CM

## 2011-04-04 LAB — CARDIAC PANEL(CRET KIN+CKTOT+MB+TROPI)
CK, MB: 12.2 ng/mL (ref 0.3–4.0)
Relative Index: 4 — ABNORMAL HIGH (ref 0.0–2.5)
Troponin I: 1.14 ng/mL (ref <0.30)

## 2011-04-04 LAB — CBC
HCT: 36.4 % — ABNORMAL LOW (ref 39.0–52.0)
Hemoglobin: 13.3 g/dL (ref 13.0–17.0)
Hemoglobin: 17.1 g/dL — ABNORMAL HIGH (ref 13.0–17.0)
MCV: 102 fL — ABNORMAL HIGH (ref 78.0–100.0)
Platelets: 82 10*3/uL — ABNORMAL LOW (ref 150–400)
RBC: 3.57 MIL/uL — ABNORMAL LOW (ref 4.22–5.81)
RBC: 4.5 MIL/uL (ref 4.22–5.81)
RDW: 11.5 % (ref 11.5–15.5)
WBC: 3.7 10*3/uL — ABNORMAL LOW (ref 4.0–10.5)
WBC: 6.6 10*3/uL (ref 4.0–10.5)

## 2011-04-04 LAB — BASIC METABOLIC PANEL
BUN: 18 mg/dL (ref 6–23)
BUN: 18 mg/dL (ref 6–23)
CO2: 26 mEq/L (ref 19–32)
CO2: 27 mEq/L (ref 19–32)
Calcium: 8.8 mg/dL (ref 8.4–10.5)
Chloride: 101 mEq/L (ref 96–112)
Creatinine, Ser: 1.84 mg/dL — ABNORMAL HIGH (ref 0.4–1.5)
GFR calc Af Amer: 48 mL/min — ABNORMAL LOW (ref >60)
GFR calc non Af Amer: 57 mL/min — ABNORMAL LOW (ref >60)
GFR calc non Af Amer: 39 mL/min — ABNORMAL LOW (ref >60)
Glucose, Bld: 121 mg/dL — ABNORMAL HIGH (ref 70–99)
Glucose, Bld: 102 mg/dL — ABNORMAL HIGH (ref 70–99)
Potassium: 4 mEq/L (ref 3.5–5.1)
Potassium: 4.3 mEq/L (ref 3.5–5.1)
Sodium: 137 mEq/L (ref 135–145)

## 2011-04-04 LAB — DIFFERENTIAL
Basophils Absolute: 0 10*3/uL (ref 0.0–0.1)
Basophils Absolute: 0 10*3/uL (ref 0.0–0.1)
Basophils Relative: 0 % (ref 0–1)
Eosinophils Absolute: 0 10*3/uL (ref 0.0–0.7)
Eosinophils Relative: 4 % (ref 0–5)
Eosinophils Relative: 1 % (ref 0–5)
Lymphocytes Relative: 32 % (ref 12–46)
Lymphocytes Relative: 6 % — ABNORMAL LOW (ref 12–46)
Lymphs Abs: 1.2 10*3/uL (ref 0.7–4.0)
Lymphs Abs: 0.4 10*3/uL — ABNORMAL LOW (ref 0.7–4.0)
Monocytes Absolute: 0.5 10*3/uL (ref 0.1–1.0)
Monocytes Relative: 7 % (ref 3–12)
Neutro Abs: 2 10*3/uL (ref 1.7–7.7)
Neutro Abs: 5.7 10*3/uL (ref 1.7–7.7)
Neutrophils Relative %: 86 % — ABNORMAL HIGH (ref 43–77)

## 2011-04-04 LAB — BLOOD GAS, ARTERIAL
Acid-Base Excess: 0.1 mmol/L (ref 0.0–2.0)
Bicarbonate: 25.7 mEq/L — ABNORMAL HIGH (ref 20.0–24.0)
Drawn by: 246861
O2 Content: 50 L/min
O2 Saturation: 96.9 %
Patient temperature: 98.6
TCO2: 27.4 mmol/L (ref 0–100)
pCO2 arterial: 53.6 mmHg — ABNORMAL HIGH (ref 35.0–45.0)
pH, Arterial: 7.302 — ABNORMAL LOW (ref 7.350–7.450)
pO2, Arterial: 85.2 mmHg (ref 80.0–100.0)

## 2011-04-04 LAB — PROTIME-INR
INR: 0.94 (ref 0.00–1.49)
Prothrombin Time: 12.8 seconds (ref 11.6–15.2)

## 2011-04-04 LAB — TROPONIN I: Troponin I: <0.3 ng/mL (ref <0.30)

## 2011-04-04 LAB — ABO/RH: ABO/RH(D): A NEG

## 2011-04-04 LAB — CULTURE, BLOOD (ROUTINE X 2): Culture: NO GROWTH

## 2011-04-04 LAB — D-DIMER, QUANTITATIVE: D-Dimer, Quant: 11.59 ug/mL-FEU — ABNORMAL HIGH (ref 0.00–0.48)

## 2011-04-04 NOTE — H&P (Signed)
[name] Moreno, [name] Moreno               ACCOUNT NO.:  [id number]  MEDICAL RECORD NO.:  [id number]           PATIENT TYPE:  E  LOCATION:  [facility]                         FACILITY:  [facility]  PHYSICIAN:  Georga Hacking, M.D.DATE OF BIRTH:  [birth year]  DATE OF ADMISSION:  04/03/2011                              HISTORY & PHYSICAL   HISTORY:  A 50 year old black male with a history of hypertrophic cardiomyopathy symptomatic to me at [facility] in March 2011, defibrillator placement, hypertension, chronic kidney disease and a previous left nephrectomy, hyperlipidemia, macrocytosis and a history of anabolic steroid use.  He was recently admitted to the hospital and had a catheterization showing no significant coronary artery disease but had an increased LVEDP and felt he had supply and demand mismatch.  He developed fever and chills was admitted to the hospital on Mar 29, 2011, where he was hypotensive.  He was felt to be in septic shock and had negative blood cultures.  He responded to fluid bolus and was found to have a large vegetation on his ICD lead.  He was seen by Electrophysiology who advised removal of ICD lead.  Blood cultures evidently were negative and he was treated with vancomycin.  He was discharged Friday and was seen by the Home Health nurse over the weekend.  He was supposed to return in the morning for ICD lead removal by Dr. Ladona Ridgel.  He presented to the emergency room tonight with shortness of breath and chest discomfort described as a pressure feeling.  He was seen by the emergency room doctor, he has a left bundle- branch block pattern and is admitted at this time.  His past history, social history, family history, medications, and review of systems are all unchanged except as noted above from Friday.  PHYSICAL EXAMINATION:  GENERAL:  He is a well-developed, well-nourished male appears black/Hispanic in no acute distress. VITAL SIGNS:  Blood pressure is currently  117/80, pulse is currently 69. SKIN:  Warm and dry. ENT:  EOMI.  PERRLA.  He appears muscular.  Pharynx negative. CNS:  Clear. NECK:  Supple without masses. LUNGS:  Clear. CARDIOVASCULAR:  Normal S1, S2.  No S3.  S4 noted. ABDOMEN:  Soft and nontender.  Pulses present and 2+.  No labs are noted at this time.  IMPRESSION: 1. Recurrent chest pain, shortness of breath several admissions     recently for this. 2. Hypertrophic cardiomyopathy with previous septal myectomy. 3. Previous implantable defibrillator with an automatic implantable     cardioverter-defibrillator lead infection. 4. Mild chronic kidney insufficiency status post left nephrectomy. 5. History of colitis. 6. Previous anabolic steroid use.  RECOMMENDATIONS:  I think he will be fine to go to the operating room in the morning.  We will plan to check lab work on him to be sure that everything else is okay but I see it would be reasonable for him to go in to the operating room if this is okay.     Georga Hacking, M.D.     WST/MEDQ  D:  04/04/2011  T:  04/04/2011  Job:  [id number]  Electronically Signed by W.  Donnie Aho M.D. on 04/04/2011 01:21:40 AM

## 2011-04-05 DIAGNOSIS — I38 Endocarditis, valve unspecified: Secondary | ICD-10-CM

## 2011-04-05 LAB — BASIC METABOLIC PANEL
CO2: 29 mEq/L (ref 19–32)
Calcium: 9 mg/dL (ref 8.4–10.5)
Creatinine, Ser: 1.5 mg/dL (ref 0.4–1.5)
Glucose, Bld: 147 mg/dL — ABNORMAL HIGH (ref 70–99)

## 2011-04-05 LAB — CBC
HCT: 43.8 % (ref 39.0–52.0)
Hemoglobin: 16.3 g/dL (ref 13.0–17.0)
MCH: 38 pg — ABNORMAL HIGH (ref 26.0–34.0)
MCHC: 37.2 g/dL — ABNORMAL HIGH (ref 30.0–36.0)
MCV: 102.1 fL — ABNORMAL HIGH (ref 78.0–100.0)

## 2011-04-05 LAB — DIFFERENTIAL
Basophils Relative: 0 % (ref 0–1)
Eosinophils Relative: 2 % (ref 0–5)
Lymphocytes Relative: 10 % — ABNORMAL LOW (ref 12–46)
Monocytes Relative: 6 % (ref 3–12)
Neutrophils Relative %: 82 % — ABNORMAL HIGH (ref 43–77)

## 2011-04-05 LAB — CARDIAC PANEL(CRET KIN+CKTOT+MB+TROPI): CK, MB: 7.4 ng/mL (ref 0.3–4.0)
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
## 2011-04-06 DIAGNOSIS — I38 Endocarditis, valve unspecified: Secondary | ICD-10-CM

## 2011-04-06 LAB — VANCOMYCIN, TROUGH: Vancomycin Tr: 21.5 ug/mL — ABNORMAL HIGH (ref 10.0–20.0)

## 2011-04-07 LAB — BASIC METABOLIC PANEL
CO2: 27 mEq/L (ref 19–32)
Calcium: 9 mg/dL (ref 8.4–10.5)
Chloride: 99 mEq/L (ref 96–112)
GFR calc Af Amer: >60 mL/min (ref >60)
Sodium: 136 mEq/L (ref 135–145)

## 2011-04-07 LAB — TYPE AND SCREEN
ABO/RH(D): A NEG
Antibody Screen: NEGATIVE
Unit division: 0
Unit division: 0

## 2011-04-07 NOTE — Op Note (Signed)
NAMESHERLOCK, NANCARROW               ACCOUNT NO.:  [id number]  MEDICAL RECORD NO.:  [id number]           PATIENT TYPE:  I  LOCATION:  3307                         FACILITY:  [facility]  PHYSICIAN:  Doylene Canning. Ladona Ridgel, MD    DATE OF BIRTH:  [birth year]  DATE OF PROCEDURE:  04/04/2011 DATE OF DISCHARGE:                              OPERATIVE REPORT   PROCEDURE PERFORMED:  Implantable cardioverter-defibrillator system extraction without immediate procedure complication.  INDICATIONS:  ICD lead endocarditis.  INTRODUCTION:  The patient is a 50 year old male with a longstanding hypertrophic cardiomyopathy.  He has a history of sudden cardiac death. He has left bundle-branch block and he is status post myomectomy.  He is status post ICD insertion.  No fevers and chills.  Several weeks prior to admission was given ciprofloxacin, then had recurrent fevers and chills and presented for additional evaluation with a white count and transesophageal echo demonstrating vegetations up to 1.8 cm on his ICD lead.  He is now referred for removal of the device.  PROCEDURE:  After informed consent was obtained, the patient was taken to the operating room in a fasting state.  The Anesthesia Service was utilized to provide anesthesia utilizing predominance of propofol.  The patient was prepped and draped in the usual manner.  Additional 30 mL of lidocaine was infiltrated over the old ICD insertion site. Electrocautery was utilized to dissect down to the fascial plane.  The ICD pocket was entered with electrocautery and removed with gentle traction.  The leads were freed up with electrocautery.  The sewing sleeve was also freed up with electrocautery.  At this point, the can was removed from the leads.  A stylet was advanced into both atrial and defibrillation leads and attempts to retract the helix were carried out. At this point, the liberator locking stylet was advanced into the cut atrial and defibrillator  leads and advanced as far possible and then locked in place.  Silk sutures were tied to the insulation as well.  The atrial lead was targeted first.  The 11-French [facility] helical extraction sheath was advanced over the atrial lead and into the subclavian vein without particular difficulty.  The 11-French [facility] electrosurgical dissection sheath was advanced after removal of the Shortie sheath. Through surgical dissection, the [facility] sheath was advanced over the atrial lead.  There were very dense fibrous binding sites around the subclavian/innominate vein junction at the ICD coils.  Multiple attempts to get beyond this were unsuccessful.  At this point, the electrosurgical dissection sheath was removed and the 11-French [place] sheath was advanced this time over the defibrillator lead.  The Shortie helical sheath was advanced over the defibrillator lead into the subclavian vein.  At this point, the 13-French [facility] electrosurgical dissection sheath was advanced over the defibrillator lead and utilizing a combination of pressure, counter pressure, traction and counter traction the sheath was utilized and advanced down across the initial proximal coil and then across the distal coil and with a combined approach of this the lead was removed in total.  It should be noted that just prior to this the patient's blood pressure dropped  some and Dr. Noreene Larsson was summoned to place a transesophageal echo probe.  This demonstrated no pericardial effusion and the ventricles being very vigorously.  On chest x-ray no evidence of any hemothorax and the procedure was continued.  It should also be noted that with a bolus of fluid the patient's pressure normalized nicely.  At this point, the attention then turned back to the atrial lead.  The Shortie electrosurgical dissection sheath was advanced back over the atrial lead and advanced further into the subclavian vein all the way to the innominate vein.  It was then  removed and the 13-French sheath was advanced over the atrial lead and advanced all the way down into the right atrium.  Again with a combination of pressure, counter pressure, traction and counter traction, the atrial lead was removed in total. Transesophageal echo demonstrated no pericardial effusion and the patient's hemodynamics remained quite stable.  After approximately 5 minutes a fluoroscopy demonstrated no pericardial effusion nor did it demonstrate any evidence of any substantial hemothorax.  Pressure was held.  The pocket was irrigated and then closed with 2-0 and 3-0 Vicryl. Benzoin and Steri-Strips were painted on the skin.  A pressure dressing was applied and the patient was returned to the recovery area in satisfactory condition.  COMPLICATIONS:  There were no immediate procedure complications.  RESULTS:  Demonstrate successful ICD system removal in a patient with ICD lead endocarditis.     Doylene Canning. Ladona Ridgel, MD     GWT/MEDQ  D:  04/04/2011  T:  04/04/2011  Job:  [id number]  cc:   Lyn Records, M.D.  Electronically Signed by Lewayne Bunting MD on 04/07/2011 12:54:11 PM
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
## 2011-04-08 NOTE — Discharge Summary (Signed)
NAMEJASTON, [name] Moreno               ACCOUNT NO.:  [id number]  MEDICAL RECORD NO.:  [id number]           PATIENT TYPE:  I  LOCATION:  2610                         FACILITY:  [facility]  PHYSICIAN:  Lyn Records, M.D.   DATE OF BIRTH:  [birth year]  DATE OF ADMISSION:  03/29/2011 DATE OF DISCHARGE:  04/01/2011                              DISCHARGE SUMMARY   DISCHARGE DIAGNOSES: 1. Implantable cardiac defibrillator lead endocarditis.     a.     Presentation with septic shock. 2. Hypertrophic obstructive cardiomyopathy.     a.     History of surgical septal myectomy.     b.     Recurrent chest pain secondary to hypertrophy and demand      ischemia.     c.     Prophylactic automatic implantable cardioverter-      defibrillator. 3. History of colitis. 4. Renal insufficiency.     a.     Status post left nephrectomy. 5. Hypertension.  DISCHARGE PLANS: 1. AICD lead removal by Dr. Gilman Schmidt on Apr 04, 2011.  The     patient's report to the short-stay at [facility] at 5:00 a.m. on Apr 04, 2011. 2. Home health.  We will continue the patient's IV vancomycin at 1250     mg q.12 h. 3. His other medication regimen will be unchanged from admission and     we will include enalapril 10 mg 2 tablets daily. 4. Metoprolol succinate 100 mg twice daily. 5. Verapamil SR 240 mg daily. 6. Imodium 1-2 tablets as needed for diarrhea activity. 7. Activity:  As tolerated. 8. Long discussion concerning the importance of not missing antibiotic     treatments.  HISTORY AND PHYSICAL AND HOSPITAL COURSE:  Please see the admitting history and physical.  He was admitted on Mar 29, 2011, in shock with a fever.  Echocardiography performed on the same day demonstrated right atrial defibrillator lead vegetation.  He subsequently underwent transesophageal echocardiography on Mar 30, 2011, which confirmed a large vegetation on the AICD lead.  No valvular vegetations or myocardial abscesses were noted.  The  patient was seen in consultation by Critical Care Medicine, Infectious Disease, and ultimately Electrophysiology.  Critical Care helped with the patient's initial management.  However, his shock quickly reversed itself with fluid resuscitation.  IV antibiotics were started and ultimately IV vancomycin was chosen by Infectious Disease as a treatment of choice.  Blood cultures were performed at the time of discharge and negative.  The patient was seen in consultation by Dr. Gilman Schmidt who has arranged for the AICD device to be explanted on Apr 04, 2011.  The patient's clinical condition improved to the point that on Apr 01, 2011, he was discharged home with home health services for IV antibiotic administration.  He will return for the explant on Monday at 5:00 a.m.     Lyn Records, M.D.     HWS/MEDQ  D:  04/01/2011  T:  04/02/2011  Job:  [id number]  cc:   Cliffton Asters, M.D. Doylene Canning. Ladona Ridgel, MD  Electronically Signed by Verdis Prime  M.D. on 04/08/2011 01:37:36 PM

## 2011-04-11 ENCOUNTER — Emergency Department (HOSPITAL_COMMUNITY)
Admission: EM | Admit: 2011-04-11 | Discharge: 2011-04-11 | Disposition: A | Discharge status: Home / Self Care | Payer: Managed Care, Other (non HMO) | Attending: Emergency Medicine | Admitting: Emergency Medicine

## 2011-04-11 DIAGNOSIS — R079 Chest pain, unspecified: Secondary | ICD-10-CM | POA: Insufficient documentation

## 2011-04-11 DIAGNOSIS — Z95810 Presence of automatic (implantable) cardiac defibrillator: Secondary | ICD-10-CM | POA: Insufficient documentation

## 2011-04-11 DIAGNOSIS — Z9889 Other specified postprocedural states: Secondary | ICD-10-CM | POA: Insufficient documentation

## 2011-04-11 DIAGNOSIS — I509 Heart failure, unspecified: Secondary | ICD-10-CM | POA: Insufficient documentation

## 2011-04-11 DIAGNOSIS — I2510 Atherosclerotic heart disease of native coronary artery without angina pectoris: Secondary | ICD-10-CM | POA: Insufficient documentation

## 2011-04-11 DIAGNOSIS — Z79899 Other long term (current) drug therapy: Secondary | ICD-10-CM | POA: Insufficient documentation

## 2011-04-11 DIAGNOSIS — R229 Localized swelling, mass and lump, unspecified: Secondary | ICD-10-CM | POA: Insufficient documentation

## 2011-04-11 LAB — CBC
HCT: 43.9 % (ref 39.0–52.0)
Hemoglobin: 16.6 g/dL (ref 13.0–17.0)
MCHC: 37.8 g/dL — ABNORMAL HIGH (ref 30.0–36.0)
RBC: 4.4 MIL/uL (ref 4.22–5.81)

## 2011-04-11 LAB — POCT I-STAT, CHEM 8
Chloride: 108 mEq/L (ref 96–112)
Glucose, Bld: 119 mg/dL — ABNORMAL HIGH (ref 70–99)
HCT: 45 % (ref 39.0–52.0)
Potassium: 4.4 mEq/L (ref 3.5–5.1)
Sodium: 138 mEq/L (ref 135–145)

## 2011-04-13 ENCOUNTER — Emergency Department (HOSPITAL_COMMUNITY)
Admission: EM | Admit: 2011-04-13 | Discharge: 2011-04-13 | Disposition: A | Discharge status: Home / Self Care | Payer: Medicare Other | Attending: Emergency Medicine | Admitting: Emergency Medicine

## 2011-04-13 DIAGNOSIS — Y838 Other surgical procedures as the cause of abnormal reaction of the patient, or of later complication, without mention of misadventure at the time of the procedure: Secondary | ICD-10-CM | POA: Insufficient documentation

## 2011-04-13 DIAGNOSIS — R0789 Other chest pain: Secondary | ICD-10-CM | POA: Insufficient documentation

## 2011-04-13 DIAGNOSIS — I509 Heart failure, unspecified: Secondary | ICD-10-CM | POA: Insufficient documentation

## 2011-04-13 DIAGNOSIS — IMO0002 Reserved for concepts with insufficient information to code with codable children: Secondary | ICD-10-CM | POA: Insufficient documentation

## 2011-04-13 DIAGNOSIS — R229 Localized swelling, mass and lump, unspecified: Secondary | ICD-10-CM | POA: Insufficient documentation

## 2011-04-13 DIAGNOSIS — I252 Old myocardial infarction: Secondary | ICD-10-CM | POA: Insufficient documentation

## 2011-04-13 DIAGNOSIS — I2510 Atherosclerotic heart disease of native coronary artery without angina pectoris: Secondary | ICD-10-CM | POA: Insufficient documentation

## 2011-04-13 DIAGNOSIS — I447 Left bundle-branch block, unspecified: Secondary | ICD-10-CM | POA: Insufficient documentation

## 2011-04-13 LAB — BASIC METABOLIC PANEL
CO2: 23 mEq/L (ref 19–32)
Calcium: 8.9 mg/dL (ref 8.4–10.5)
Chloride: 104 mEq/L (ref 96–112)
Glucose, Bld: 106 mg/dL — ABNORMAL HIGH (ref 70–99)
Potassium: 4.2 mEq/L (ref 3.5–5.1)
Sodium: 135 mEq/L (ref 135–145)

## 2011-04-13 LAB — CBC
HCT: 37 % — ABNORMAL LOW (ref 39.0–52.0)
Hemoglobin: 13.5 g/dL (ref 13.0–17.0)
MCH: 36.5 pg — ABNORMAL HIGH (ref 26.0–34.0)
MCHC: 36.5 g/dL — ABNORMAL HIGH (ref 30.0–36.0)
MCV: 100 fL (ref 78.0–100.0)
RBC: 3.7 MIL/uL — ABNORMAL LOW (ref 4.22–5.81)

## 2011-04-13 LAB — DIFFERENTIAL
Basophils Relative: 2 % — ABNORMAL HIGH (ref 0–1)
Eosinophils Absolute: 0.6 10*3/uL (ref 0.0–0.7)
Eosinophils Relative: 11 % — ABNORMAL HIGH (ref 0–5)
Lymphs Abs: 1.4 10*3/uL (ref 0.7–4.0)
Monocytes Absolute: 0.5 10*3/uL (ref 0.1–1.0)
Monocytes Relative: 9 % (ref 3–12)
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
## 2011-04-15 ENCOUNTER — Emergency Department (HOSPITAL_COMMUNITY)
Admission: EM | Admit: 2011-04-15 | Discharge: 2011-04-15 | Disposition: A | Discharge status: Home / Self Care | Payer: Managed Care, Other (non HMO) | Attending: Emergency Medicine | Admitting: Emergency Medicine

## 2011-04-15 DIAGNOSIS — Z95810 Presence of automatic (implantable) cardiac defibrillator: Secondary | ICD-10-CM | POA: Insufficient documentation

## 2011-04-15 DIAGNOSIS — I2510 Atherosclerotic heart disease of native coronary artery without angina pectoris: Secondary | ICD-10-CM | POA: Insufficient documentation

## 2011-04-15 DIAGNOSIS — Z7982 Long term (current) use of aspirin: Secondary | ICD-10-CM | POA: Insufficient documentation

## 2011-04-15 DIAGNOSIS — T82898A Other specified complication of vascular prosthetic devices, implants and grafts, initial encounter: Secondary | ICD-10-CM | POA: Insufficient documentation

## 2011-04-15 DIAGNOSIS — I509 Heart failure, unspecified: Secondary | ICD-10-CM | POA: Insufficient documentation

## 2011-04-15 DIAGNOSIS — Y849 Medical procedure, unspecified as the cause of abnormal reaction of the patient, or of later complication, without mention of misadventure at the time of the procedure: Secondary | ICD-10-CM | POA: Insufficient documentation

## 2011-04-15 DIAGNOSIS — Z79899 Other long term (current) drug therapy: Secondary | ICD-10-CM | POA: Insufficient documentation

## 2011-04-15 DIAGNOSIS — I252 Old myocardial infarction: Secondary | ICD-10-CM | POA: Insufficient documentation

## 2011-04-18 ENCOUNTER — Encounter: Payer: Self-pay | Admitting: Internal Medicine

## 2011-04-19 ENCOUNTER — Telehealth: Payer: Self-pay | Admitting: *Deleted

## 2011-04-19 ENCOUNTER — Telehealth: Payer: Self-pay | Admitting: Internal Medicine

## 2011-04-19 NOTE — Telephone Encounter (Signed)
rec'd fax showing elevated creatinine from [facility] pharmacy. Spoke with Dr. Luciana Axe who had seen him in the hospital. To follow protocol. Called pharmacist back & relayed this. She is holding the vanco & will restart tomorrow with 1 gram

## 2011-04-19 NOTE — Telephone Encounter (Signed)
[name] Moreno states she needs order to pull pt pick line. Pt needs to have it done before June 17th or by Friday 15th. pt is going on vacation.

## 2011-04-19 NOTE — Telephone Encounter (Signed)
I talked with Arline Asp at [facility]. Pt's last dose of Vancomycin 04/29/11. Pt going to [place] 05/01/11.Arline Asp states pt states Dr Ladona Ridgel aware of this.)  Arline Asp requesting order to DC PICC after 04/29/11 and before 05/01/11.  I will forward to Dr Ladona Ridgel.

## 2011-04-20 NOTE — Telephone Encounter (Signed)
Arline Asp called back regarding pic line order to remove.  She can take a verbal as well.[phone]

## 2011-04-20 NOTE — Telephone Encounter (Signed)
Take out after last dose of antibiotic  [facility]N aware

## 2011-05-02 NOTE — Consult Note (Signed)
[name], SQUARE               ACCOUNT NO.:  [id number]  MEDICAL RECORD NO.:  [id number]           PATIENT TYPE:  LOCATION:                                 FACILITY:  PHYSICIAN:  Gardiner Barefoot, MD    DATE OF BIRTH:  08/16/1961  DATE OF CONSULTATION: DATE OF DISCHARGE:                                CONSULTATION   REASON FOR CONSULTATION:  ICD lead endocarditis.  HISTORY OF PRESENT ILLNESS:  This is a 50 year old male with history of hypertrophic cardiomyopathy and history of sudden cardiac death, who has had an ICD placed due to that in the past, was noted to have significant fever and chills.  The patient was placed on antibiotics empirically for fever and chills and subsequently went further evaluation with TEE which demonstrated a vegetation of 1.8 cm on the ICD lead.  He subsequently was started on vancomycin and was scheduled for removal of ICD, however, represented to the emergency room for shortness of breath and chest pain.  He subsequently on Apr 04, 2011 had underwent removal of the ICD with all the leads and all hardware has been taken out.  He did have also blood cultures prior to this done on Mar 29, 2011, which are negative x2.  At this time, he is resting comfortably post removal.  PAST MEDICAL HISTORY: 1. Hypertrophic cardiomyopathy with ICD placement. 2. ICD endocarditis as described in HPI. 3. History of anabolic steroid use.  MEDICATIONS:  Include: 1. Metoprolol 100 mg every 12 hours. 2. Meclizine 25 mg as needed. 3. Vancomycin 1250 mg every 12 hours IV. 4. Verapamil 240 mg daily.  ALLERGIES:  Includes PENICILLIN which causes hives and trouble breathing as well as VITAMIN K, IODINE, and CONTRAST MEDIA.  SOCIAL HISTORY:  He does have a history as noted of anabolic steroid use, but denies alcohol or drug use.  FAMILY HISTORY:  Has hypertrophic obstructive cardiomyopathy.  PAST SURGICAL HISTORY:  Includes: 1. AICD implantation in 2005. 2.  History of septal myomectomy in 2011. 3. Left nephrectomy.  PHYSICAL EXAM:  VITAL SIGNS:  Temperature is 98.5, the patient has remained afebrile, heart rate of 72, blood pressure of 128/85, respiratory rate is 14, O2 sat is 95% on room air. GENERAL:  The patient is awake, alert, and oriented x3, appears in no acute distress. CARDIOVASCULAR:  Regular rate and rhythm.  No murmurs, rubs, or gallops appreciated. LUNGS:  Clear to auscultation bilaterally. ABDOMEN:  Soft, nontender, nondistended.  Positive bowel sounds.  No hepatosplenomegaly.  LABS:  Show negative blood cultures x2 from May 15.  WBC is 7.0 with 82% neutrophils, hemoglobin 16.3.  Creatinine is 1.50.  ASSESSMENT:  A 50 year old male with ICD lead endocarditis status post removal.  PLAN:  The patient will certainly need replacement of his ICD and this can be done since his blood cultures have remained negative and the lead has been removed, in the next several days.  I would continue with vancomycin for a total of 14 days, postop removal as well.     Gardiner Barefoot, MDRWC/MEDQ  D:  04/05/2011  T:  04/06/2011  Job:  [id number]  Electronically Signed by Staci Righter MD on 05/02/2011 11:05:17 AM

## 2011-05-06 ENCOUNTER — Ambulatory Visit (INDEPENDENT_AMBULATORY_CARE_PROVIDER_SITE_OTHER): Payer: Managed Care, Other (non HMO) | Admitting: Internal Medicine

## 2011-05-06 ENCOUNTER — Encounter: Payer: Self-pay | Admitting: Internal Medicine

## 2011-05-06 ENCOUNTER — Encounter: Payer: Self-pay | Admitting: *Deleted

## 2011-05-06 VITALS — BP 146/99 | HR 70 | Resp 18 | Ht 66.0 in | Wt 181.6 lb

## 2011-05-06 DIAGNOSIS — T827XXA Infection and inflammatory reaction due to other cardiac and vascular devices, implants and grafts, initial encounter: Secondary | ICD-10-CM

## 2011-05-06 DIAGNOSIS — I10 Essential (primary) hypertension: Secondary | ICD-10-CM

## 2011-05-06 DIAGNOSIS — Z8679 Personal history of other diseases of the circulatory system: Secondary | ICD-10-CM

## 2011-05-06 DIAGNOSIS — I421 Obstructive hypertrophic cardiomyopathy: Secondary | ICD-10-CM

## 2011-05-06 LAB — CBC WITH DIFFERENTIAL/PLATELET
Basophils Absolute: 0 10*3/uL (ref 0.0–0.1)
Eosinophils Relative: 3.9 % (ref 0.0–5.0)
HCT: 46 % (ref 39.0–52.0)
Lymphs Abs: 1.5 10*3/uL (ref 0.7–4.0)
MCV: 106.8 fl — ABNORMAL HIGH (ref 78.0–100.0)
Monocytes Absolute: 0.3 10*3/uL (ref 0.1–1.0)
Monocytes Relative: 5.8 % (ref 3.0–12.0)
Neutrophils Relative %: 59.7 % (ref 43.0–77.0)
Platelets: 107 10*3/uL — ABNORMAL LOW (ref 150.0–400.0)
RDW: 12 % (ref 11.5–14.6)
WBC: 4.9 10*3/uL (ref 4.5–10.5)

## 2011-05-06 LAB — PROTIME-INR
INR: 1 ratio (ref 0.8–1.0)
Prothrombin Time: 10.8 s (ref 10.2–12.4)

## 2011-05-06 LAB — BASIC METABOLIC PANEL
BUN: 21 mg/dL (ref 6–23)
Creatinine, Ser: 1.9 mg/dL — ABNORMAL HIGH (ref 0.4–1.5)
GFR: 41.51 mL/min — ABNORMAL LOW (ref >60.00)
Glucose, Bld: 104 mg/dL — ABNORMAL HIGH (ref 70–99)
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
## 2011-05-06 NOTE — Assessment & Plan Note (Signed)
He has had no recurrent ventricular arrhythmias. He will continue his current medical therapy. 

## 2011-05-06 NOTE — Assessment & Plan Note (Signed)
The patient has undergone his entire antibiotic therapy and his IV line has been removed. He has no evidence of any ongoing infection. I discussed the treatment was with the patient. We will plan to proceed with ICD system insertion in the next several weeks. Risks, goals, benefits, and expectations of the procedure discussed with the patient and he wishes to proceed.

## 2011-05-06 NOTE — Patient Instructions (Signed)

## 2011-05-09 ENCOUNTER — Ambulatory Visit (HOSPITAL_COMMUNITY)
Admission: RE | Admit: 2011-05-09 | Discharge: 2011-05-09 | Disposition: A | Discharge status: Home / Self Care | Payer: Managed Care, Other (non HMO) | Source: Ambulatory Visit | Attending: Internal Medicine | Admitting: Internal Medicine

## 2011-05-09 ENCOUNTER — Encounter: Payer: Self-pay | Admitting: *Deleted

## 2011-05-09 DIAGNOSIS — Z538 Procedure and treatment not carried out for other reasons: Secondary | ICD-10-CM | POA: Insufficient documentation

## 2011-05-09 DIAGNOSIS — I519 Heart disease, unspecified: Secondary | ICD-10-CM | POA: Insufficient documentation

## 2011-05-16 ENCOUNTER — Other Ambulatory Visit: Payer: Self-pay | Admitting: Internal Medicine

## 2011-05-16 ENCOUNTER — Other Ambulatory Visit: Payer: Managed Care, Other (non HMO) | Admitting: *Deleted

## 2011-05-16 DIAGNOSIS — T827XXA Infection and inflammatory reaction due to other cardiac and vascular devices, implants and grafts, initial encounter: Secondary | ICD-10-CM

## 2011-05-22 LAB — CULTURE, BLOOD (SINGLE): Organism ID, Bacteria: NO GROWTH

## 2011-05-25 ENCOUNTER — Telehealth: Payer: Self-pay | Admitting: Internal Medicine

## 2011-05-25 ENCOUNTER — Ambulatory Visit (HOSPITAL_COMMUNITY)
Admission: RE | Admit: 2011-05-25 | Discharge: 2011-05-26 | Disposition: A | Discharge status: Home / Self Care | Payer: Managed Care, Other (non HMO) | Source: Ambulatory Visit | Attending: Internal Medicine | Admitting: Internal Medicine

## 2011-05-25 DIAGNOSIS — I4901 Ventricular fibrillation: Secondary | ICD-10-CM | POA: Insufficient documentation

## 2011-05-25 DIAGNOSIS — D696 Thrombocytopenia, unspecified: Secondary | ICD-10-CM | POA: Insufficient documentation

## 2011-05-25 DIAGNOSIS — I509 Heart failure, unspecified: Secondary | ICD-10-CM | POA: Insufficient documentation

## 2011-05-25 DIAGNOSIS — Z01812 Encounter for preprocedural laboratory examination: Secondary | ICD-10-CM | POA: Insufficient documentation

## 2011-05-25 DIAGNOSIS — D72819 Decreased white blood cell count, unspecified: Secondary | ICD-10-CM | POA: Insufficient documentation

## 2011-05-25 DIAGNOSIS — I5030 Unspecified diastolic (congestive) heart failure: Secondary | ICD-10-CM | POA: Insufficient documentation

## 2011-05-25 DIAGNOSIS — E781 Pure hyperglyceridemia: Secondary | ICD-10-CM | POA: Insufficient documentation

## 2011-05-25 DIAGNOSIS — N189 Chronic kidney disease, unspecified: Secondary | ICD-10-CM | POA: Insufficient documentation

## 2011-05-25 DIAGNOSIS — R079 Chest pain, unspecified: Secondary | ICD-10-CM | POA: Insufficient documentation

## 2011-05-25 DIAGNOSIS — I422 Other hypertrophic cardiomyopathy: Secondary | ICD-10-CM | POA: Insufficient documentation

## 2011-05-25 DIAGNOSIS — I129 Hypertensive chronic kidney disease with stage 1 through stage 4 chronic kidney disease, or unspecified chronic kidney disease: Secondary | ICD-10-CM | POA: Insufficient documentation

## 2011-05-25 LAB — BASIC METABOLIC PANEL
BUN: 23 mg/dL (ref 6–23)
CO2: 22 mEq/L (ref 19–32)
Chloride: 103 mEq/L (ref 96–112)
Creatinine, Ser: 1.46 mg/dL — ABNORMAL HIGH (ref 0.50–1.35)
Glucose, Bld: 96 mg/dL (ref 70–99)

## 2011-05-25 LAB — CBC
HCT: 37 % — ABNORMAL LOW (ref 39.0–52.0)
MCHC: 38.9 g/dL — ABNORMAL HIGH (ref 30.0–36.0)
MCV: 97.1 fL (ref 78.0–100.0)
RDW: 12.9 % (ref 11.5–15.5)

## 2011-05-25 LAB — PROTIME-INR: INR: 0.99 (ref 0.00–1.49)

## 2011-05-25 LAB — APTT: aPTT: 37 seconds (ref 24–37)

## 2011-05-25 NOTE — Telephone Encounter (Signed)
[facility] paper faxed for Kelly/Taylor copy in Drawer  05/25/11/km

## 2011-05-26 ENCOUNTER — Ambulatory Visit (HOSPITAL_COMMUNITY): Payer: Managed Care, Other (non HMO)

## 2011-06-02 NOTE — Discharge Summary (Signed)
  NAMEMERRELL, BORSUK               ACCOUNT NO.:  [id number]  MEDICAL RECORD NO.:  [id number]           PATIENT TYPE:  I  LOCATION:  2014                         FACILITY:  [facility]  PHYSICIAN:  Doylene Canning. Ladona Ridgel, MD    DATE OF BIRTH:  [birth year]  DATE OF ADMISSION:  04/03/2011 DATE OF DISCHARGE:  04/07/2011                              DISCHARGE SUMMARY   ADDENDUM:  The patient reported intolerance to VICODIN and therefore, this will removed from his discharge medication list and replaced with codeine/acetaminophen 30 - 300 mg one tablet q.6 h.     Nicolasa Ducking, ANP   ______________________________ Doylene Canning. Ladona Ridgel, MD    CB/MEDQ  D:  04/07/2011  T:  04/08/2011  Job:  [id number]  Electronically Signed by Nicolasa Ducking ANP on 04/11/2011 07:50:02 PM Electronically Signed by Lewayne Bunting MD on 06/02/2011 08:28:12 AM

## 2011-06-02 NOTE — Op Note (Signed)
[name], [name] Moreno               ACCOUNT NO.:  [id number]  MEDICAL RECORD NO.:  [id number]  LOCATION:  3706                         FACILITY:  [facility]  PHYSICIAN:  Doylene Canning. Ladona Ridgel, MD    DATE OF BIRTH:  09-29-1961  DATE OF PROCEDURE:  05/25/2011 DATE OF DISCHARGE:                              OPERATIVE REPORT   PROCEDURE PERFORMED:  Insertion of a single chamber defibrillator.  SURGEON:  Doylene Canning. Ladona Ridgel, MD  INDICATIONS:  Hypertrophic cardiomyopathy with ventricular fibrillation.  INTRODUCTION:  The patient is a 50 year old man with longstanding hypertrophic cardiomyopathy, syncope, and documented VF.  The patient developed endocarditis on his defibrillator lead and underwent extraction several months ago.  He had been on long-term antibiotics which have been discontinued and his surveillance cultures have remained stable.  He is now referred for insertion of a new device.  PROCEDURE IN DETAIL:  After informed consent was obtained, the patient was taken to the diagnostic EP lab in a fasting state.  After usual preparation and draping, intravenous fentanyl and midazolam was given for sedation.  Lidocaine 30 mL was infiltrated into the right infraclavicular region.  A 6-cm incision was carried out over this region.  Electrocautery was utilized to dissect down to the fascial plane.  The right subclavian vein was then punctured and the Guidant Endotak Reliance-G dual coil ICD lead, serial number [id number] was advanced into the right ventricle where mapping was carried out.  At the final site, R-waves measured 14 mV, the pacing impedance was over 500 ohms, and the threshold was 1.3 volts at 0.5 milliseconds.  There was a satisfactory injury current noted with active fixation of the lead. With these satisfactory parameters, the lead was secured to the subpectoralis fascia with a figure-of-eight silk suture.  Sewing sleeve was secured with silk suture.  Electrocautery was utilized  to make a subcutaneous pocket.  Antibiotic irrigation was utilized to irrigate the pocket.  Electrocautery was utilized to assure hemostasis.  The Medtronic single chamber defibrillator serial number [id number] was connected to the defibrillation lead and placed back in the subcutaneous pocket where it was secured with silk suture.  At this point, the pocket was irrigated with additional antibiotic irrigation and the patient was more deeply sedated for defibrillation threshold testing.  After the patient more deeply sedated with fentanyl and Versed, VF was induced with the 50 Hz burst pacing.  A 20-joule shock was initially delivered failing to terminate VF.  A second 28-joule shock was delivered with appropriate sensing resulting in termination of ventricular fibrillation and restoration of sinus rhythm.  At this point, no additional defibrillation threshold testing was carried out. The incision was closed with 2-0 and 3-0 Vicryl.  Benzoin and Steri- Strips were painted on the skin, pressure dressing was applied, and the patient was returned to his room in satisfactory condition.  COMPLICATIONS:  There were no immediate procedure complications.  RESULTS:  This demonstrates a successful implantation of a Medtronic single chamber defibrillator in a patient with hypertrophic cardiomyopathy, syncope, and ventricular fibrillation.     Doylene Canning. Ladona Ridgel, MD     GWT/MEDQ  D:  05/25/2011  T:  05/25/2011  Job:  [id number]  Electronically Signed by Lewayne Bunting MD on 06/02/2011 08:28:18 AM

## 2011-06-02 NOTE — Discharge Summary (Addendum)
NAMEGLENDALE, [name] Moreno               ACCOUNT NO.:  [id number]  MEDICAL RECORD NO.:  [id number]  LOCATION:  3706                         FACILITY:  [facility]  PHYSICIAN:  Doylene Canning. Ladona Ridgel, MD    DATE OF BIRTH:  [birth year]  DATE OF ADMISSION:  05/25/2011 DATE OF DISCHARGE:  05/26/2011                              DISCHARGE SUMMARY   DISCHARGE DIAGNOSES: 1. Hypertrophic obstructive cardiomyopathy status post myomectomy.     a.     History of ICD implantation with subsequent ICD lead      endocarditis, status post device extraction May 2012.     b.     At this admission, status post Medtronic single chamber      defibrillator implantation, May 25, 2011.     c.     History of ventricular fibrillation arrest. 2. Chest pain, felt secondary to hypertrophic cardiomyopathy. 3. Diastolic congestive heart failure. 4. Hypertriglyceridemia 5. Hypertension. 6. Mild chronic kidney disease status post left nephrectomy. 7. History of anabolic steroid use. 8. Thrombocytopenia and leukopenia, for outpatient followup, aspirin     discontinued at this admission.  HOSPITAL COURSE:  [name] Moreno is a 50 year old gentleman with a history of HOCM status post myomectomy, history of VF arrest status post ICD implantation, who was found to have ICD lead endocarditis in May 2005, status post ICD extraction at that time.  He has been on long-term antibiotics, which had been discontinued and his surveillance cultures have remained stable.  He is referred for insertion of a new device and underwent a Medtronic single-chamber defibrillator implantation May 25, 2011.  The patient tolerated the procedure well.  It was noted on his lab work that he was noted to be leukopenic and thrombocytopenic with a platelet count of 76.  Per discussion with Dr. Ladona Ridgel, we will discontinue his aspirin.  Dr. Ladona Ridgel seen and examined the patient today and feels he is stable for discharge.  DISCHARGE LABS:  WBC 3.3, hemoglobin 14.4,  hematocrit 37, platelet count 76.  Sodium 135, potassium 4.1, chloride 103, CO2 22, glucose 96, BUN 23, creatinine 1.46.  STUDIES:  Operative report for insertion of single-chamber defibrillator, May 25, 2011, please see full report for details.  DISCHARGE MEDICATIONS: 1. Enalapril 10 mg b.i.d. 2. Imodium 2 mg 1-2 tablets t.i.d. 3. Meclizine 25 mg daily as needed for dizziness. 4. Metoprolol 200 mg daily. 5. Verapamil SR 240 mg daily.  His aspirin was stopped secondary to thrombocytopenia at this admission per discussion with Dr. Ladona Ridgel.  DISPOSITION:  [name] Moreno will be discharged in stable condition to home. He is instructed to increase activity slowly, was given a supplemental discharge instruction sheet on ICD implantation regarding activity, wound care, and bathing progression.  He is to follow a low-sodium heart- healthy diet.  He will return for a wound check at [facility] on June 08, 2011 at 10:30 a.m. and then follow up with Dr. Ladona Ridgel September 13, 2011 at 9 a.m.  He is also instructed to follow up with his PCP as his platelet count was decreased and may need further investigation as this is not noted to have been worked up inhouse.  DURATION OF DISCHARGE ENCOUNTER:  Greater than 30 minutes including physician and PA time.     Nnaemeka Samson, P.A.C.   ______________________________ Doylene Canning. Ladona Ridgel, MD    DD/MEDQ  D:  05/26/2011  T:  05/26/2011  Job:  [id number]  Electronically Signed by Lewayne Bunting MD on 06/02/2011 08:28:15 AM Electronically Signed by Ronie Spies  on 06/03/2011 12:41:31 PM

## 2011-06-02 NOTE — Discharge Summary (Signed)
[name] Moreno               ACCOUNT NO.:  [id number]  MEDICAL RECORD NO.:  [id number]           PATIENT TYPE:  I  LOCATION:  2014                         FACILITY:  [facility]  PHYSICIAN:  [name] Moreno. [name] Ridgel, MD    DATE OF BIRTH:  August 05, 1961  DATE OF ADMISSION:  04/03/2011 DATE OF DISCHARGE:  04/07/2011                              DISCHARGE SUMMARY   PRIMARY CARDIOLOGIST:  [name] Records, MD  ELECTROPHYSIOLOGIST:  [name] Moreno. 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  SECONDARY DIAGNOSES: 1. Recurrent chest pain with mild troponin elevation. 2. Hypertrophic cardiomyopathy status post prior septal myomectomy. 3. Status post prior automatic implantable cardioverter defibrillator     placement with system removal this admission. 4. Mild chronic kidney disease status post left nephrectomy. 5. History of colitis. 6. Previous antibiotic steroid use. 7. Marijuana abuse.  ALLERGIES:  PENICILLIN, SHELLFISH,  VITAMIN K, IODINE, CONTRAST.  PROCEDURES:  Successful removal prior placed automatic implantable cardioverter defibrillator system including device and leads on Apr 04, 2011.  HISTORY OF PRESENT ILLNESS:  A 50 year old male with the above problem list who was recently admitted to [facility] between Mar 30, 2011, and Apr 01, 2011, secondary to AICD lead endocarditis with evidence of sepsis.  It was felt that the patient's device would require explantation including extraction of the leads in the operating room. This was arranged to take place in the outpatient setting.  The patient was discharged home from [facility] Apr 01, 2011, on IV vancomycin with home health assistance.  Arrangements were made for the patient to undergo device explant and lead extraction on Apr 05, 2011, however on the evening of Apr 04, 2011, the patient developed chest discomfort  and presented to [facility] ED.  In the ED, there was no objective evidence of ischemia and he was made pain-free.  He was admitted for further evaluation.  HOSPITAL COURSE:  The patient did have rise in his cardiac markers with a total CK of 304, MB of 12.2 and troponin-I of 1.14.  This was felt to be most likely related to endocarditis.  The patient exhibited no additional signs of ischemia and had a known history of nonobstructive CAD by catheterization in February 01, 2011.  The patient remained stable and was taken to the OR on Apr 04, 2011, where he underwent successful ICD system extraction without immediate complication.  The patient had a PICC line and tip placed and has been maintained on IV vancomycin with levels followed by pharmacy. Infectious Disease was consulted and recommended continuation of vancomycin at current dose of 1250 mg q.12 hours.  In the absence of an ICD, we have arranged for the patient to have a LifeVest placed and this was performed on Apr 06, 2011.  The patient is stable for discharge today and has been educated with regards to use of LifeVest.  Home Health has been reestablished to continue IV antibiotics for at least another 2 weeks.  The patient will be discharged home today in good condition.  DISCHARGE LABORATORIES:  Hemoglobin 16.3, hematocrit 43.8, WBC 7.0, platelets 78.  Sodium 136, potassium 4.2, chloride 99, CO2 27, BUN 18, creatinine 1.37, glucose 102, calcium 9.0, CK 357, MB 7.3, troponin-I 0.55, vancomycin trough 21.5.  MRSA screen was negative.  DISPOSITION:  The patient will be discharged home today in good condition.  FOLLOWUP PLANS AND APPOINTMENTS:  The patient will have ongoing IV antibiotics at home to advance home care.  Follow up with Dr. Verdis Moreno has been scheduled and will see Dr. Ladona Moreno on May 06, 2011, at 11:15 a.m.  At that time, a discussion will be held with regards to replacement of his ICD system.  DISCHARGE  MEDICATIONS: 1. Vicodin 5/500 q.6 h. p.r.n. 2. Aspirin 81 mg daily. 3. Enalapril 10 mg b.i.d. 4. Imodium 2 mg 1-2 tablets q.2 h. p.r.n. 5. Meclizine 25 mg daily p.r.n. 6. Metoprolol XL 100 mg b.i.d. 7. Verapamil SR 240 mg daily. 8. Vancomycin 1250 mg q. 12 hours.  OUTSTANDING LAB STUDIES:  None.  DURATION OF DISCHARGE ENCOUNTER:  60 minutes including physician time.     [name] Moreno, ANP   ______________________________ [name] Moreno. [name] Ridgel, MD    CB/MEDQ  D:  04/07/2011  T:  04/08/2011  Job:  [id number]  cc:   [name] Moreno, M.D.  Electronically Signed by [name] Moreno ANP on 04/11/2011 07:49:57 PM Electronically Signed by [name] Bunting MD on 06/02/2011 08:28:09 AM

## 2011-06-08 ENCOUNTER — Ambulatory Visit (INDEPENDENT_AMBULATORY_CARE_PROVIDER_SITE_OTHER): Payer: Managed Care, Other (non HMO) | Admitting: *Deleted

## 2011-06-08 ENCOUNTER — Ambulatory Visit: Payer: Managed Care, Other (non HMO) | Admitting: *Deleted

## 2011-06-08 DIAGNOSIS — Z8679 Personal history of other diseases of the circulatory system: Secondary | ICD-10-CM

## 2011-06-08 DIAGNOSIS — T827XXA Infection and inflammatory reaction due to other cardiac and vascular devices, implants and grafts, initial encounter: Secondary | ICD-10-CM

## 2011-06-08 NOTE — Progress Notes (Signed)
icd check in clinic  

## 2011-07-21 ENCOUNTER — Emergency Department (HOSPITAL_COMMUNITY): Payer: Managed Care, Other (non HMO)

## 2011-07-21 ENCOUNTER — Observation Stay (HOSPITAL_COMMUNITY)
Admission: EM | Admit: 2011-07-21 | Discharge: 2011-07-22 | Discharge status: Against Medical Advice | Payer: Managed Care, Other (non HMO) | Attending: Interventional Cardiology | Admitting: Interventional Cardiology

## 2011-07-21 DIAGNOSIS — R079 Chest pain, unspecified: Principal | ICD-10-CM | POA: Insufficient documentation

## 2011-07-21 DIAGNOSIS — N189 Chronic kidney disease, unspecified: Secondary | ICD-10-CM | POA: Insufficient documentation

## 2011-07-21 DIAGNOSIS — D61818 Other pancytopenia: Secondary | ICD-10-CM | POA: Insufficient documentation

## 2011-07-21 DIAGNOSIS — G4700 Insomnia, unspecified: Secondary | ICD-10-CM | POA: Insufficient documentation

## 2011-07-21 DIAGNOSIS — I129 Hypertensive chronic kidney disease with stage 1 through stage 4 chronic kidney disease, or unspecified chronic kidney disease: Secondary | ICD-10-CM | POA: Insufficient documentation

## 2011-07-21 DIAGNOSIS — R0602 Shortness of breath: Secondary | ICD-10-CM | POA: Insufficient documentation

## 2011-07-21 DIAGNOSIS — I5033 Acute on chronic diastolic (congestive) heart failure: Secondary | ICD-10-CM | POA: Insufficient documentation

## 2011-07-21 DIAGNOSIS — Z95810 Presence of automatic (implantable) cardiac defibrillator: Secondary | ICD-10-CM | POA: Insufficient documentation

## 2011-07-21 LAB — CBC
HCT: 28.3 % — ABNORMAL LOW (ref 39.0–52.0)
HCT: 40.5 % (ref 39.0–52.0)
Hemoglobin: 10.2 g/dL — ABNORMAL LOW (ref 13.0–17.0)
MCH: 38.9 pg — ABNORMAL HIGH (ref 26.0–34.0)
MCH: 38.8 pg — ABNORMAL HIGH (ref 26.0–34.0)
MCHC: 36 g/dL (ref 30.0–36.0)
MCV: 108 fL — ABNORMAL HIGH (ref 78.0–100.0)
MCV: 105.5 fL — ABNORMAL HIGH (ref 78.0–100.0)
Platelets: 81 10*3/uL — ABNORMAL LOW (ref 150–400)
RDW: 12.9 % (ref 11.5–15.5)

## 2011-07-21 LAB — POCT I-STAT, CHEM 8
Calcium, Ion: 1.21 mmol/L (ref 1.12–1.32)
Chloride: 109 mEq/L (ref 96–112)
Glucose, Bld: 130 mg/dL — ABNORMAL HIGH (ref 70–99)
HCT: 41 % (ref 39.0–52.0)
Hemoglobin: 13.9 g/dL (ref 13.0–17.0)

## 2011-07-21 LAB — DIFFERENTIAL
Basophils Relative: 0 % (ref 0–1)
Eosinophils Absolute: 0.1 10*3/uL (ref 0.0–0.7)
Lymphocytes Relative: 38 % (ref 12–46)
Neutro Abs: 1.7 10*3/uL (ref 1.7–7.7)

## 2011-07-21 LAB — COMPREHENSIVE METABOLIC PANEL
BUN: 15 mg/dL (ref 6–23)
Calcium: 9.3 mg/dL (ref 8.4–10.5)
Creatinine, Ser: 1.62 mg/dL — ABNORMAL HIGH (ref 0.50–1.35)
GFR calc Af Amer: 55 mL/min — ABNORMAL LOW (ref >60)
Glucose, Bld: 133 mg/dL — ABNORMAL HIGH (ref 70–99)
Sodium: 140 mEq/L (ref 135–145)
Total Protein: 6.3 g/dL (ref 6.0–8.3)

## 2011-07-21 LAB — TSH: TSH: 1.652 u[IU]/mL (ref 0.350–4.500)

## 2011-07-21 LAB — PRO B NATRIURETIC PEPTIDE: Pro B Natriuretic peptide (BNP): 1474 pg/mL — ABNORMAL HIGH (ref 0–125)

## 2011-07-21 LAB — CK TOTAL AND CKMB (NOT AT ARMC): Total CK: 259 U/L — ABNORMAL HIGH (ref 7–232)

## 2011-07-21 LAB — OCCULT BLOOD, POC DEVICE: Fecal Occult Bld: NEGATIVE
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
## 2011-07-22 LAB — CARDIAC PANEL(CRET KIN+CKTOT+MB+TROPI)
CK, MB: 6.9 ng/mL (ref 0.3–4.0)
Relative Index: 2.7 — ABNORMAL HIGH (ref 0.0–2.5)
Troponin I: 0.32 ng/mL (ref <0.30)

## 2011-07-24 NOTE — Discharge Summary (Signed)
[name], [name] Moreno NO.:  [id number]  MEDICAL RECORD NO.:  [id number]  LOCATION:  2035                         FACILITY:  [facility]  PHYSICIAN:  Lyn Records, M.D.   DATE OF BIRTH:  Mar 04, 1961  DATE OF ADMISSION:  07/21/2011 DATE OF DISCHARGE:  07/22/2011                              DISCHARGE SUMMARY   REASON FOR ADMISSION TO THE HOSPITAL:  Chest pain, excessive daytime sleepiness.  DISCHARGE DIAGNOSES: 1. Discharge against medical advice 2. Chest pain with trace positive cardiac markers similar to multiple     prior cardiac admissions. 3. History of hypertrophic cardiomyopathy and prior history of septal     myectomy. 4. History of infected AICD lead with explantation and reinsertion in     May 2012 by Dr. Gilman Schmidt. 5. Renal insufficiency with creatinines in the 1.8 range. 6. Excessive daytime sleepiness, snoring, and apnea according to the     patient.  PROCEDURES PERFORMED:  None.  DISCHARGE INSTRUCTIONS:  The patient's medication regimen is adjusted. He will increase metoprolol succinate to 150 mg per day with 100 mg each morning and 50 mg each evening.  He will continue verapamil SR 240 mg per day, multiple vitamin 1 per day, aspirin 81 mg per day, Imodium as needed, and enalapril 10 mg per day.  He also uses furosemide 40 mg as needed for swelling and dyspnea.  He does not use this on a regular basis.  ACTIVITY:  As tolerated.  The patient should consider establishing with the primary care physician.  He has never seen me in my office because his insurance plan does not cover outpatient office visits.  He follows with the physician in [place], which he has not seen for 6 months.  HISTORY AND PHYSICAL/HOSPITAL COURSE:  Please see the admitting history and physical.  The patient states that he has been having exertional chest pain.  This is similar to what he has had in the past.  No chills or fever. Appetite has been good.  Another  concern is excessive daytime sleepiness.  He has not had palpitations or an AICD discharge.  He denies syncope.  He is not currently having chest pain.  On admission, his chest x-ray were was not acute.  The laboratory data of significance included a creatinine of 1.9, CK-MB of 7.4, 6.9, and 6.9 with CK totals of 250-260 range.  Troponins were initially normal and the last one done today was 0.32 with 0.30 being the upper limit.  BNP on admission was 1474.  EKG demonstrates normal sinus rhythm with left bundle.  Heart rate is 71 beats per minute.  We decided to increase the patient's beta-blocker dose.  He does not want to stay in the hospital to have this medication titrated.  He states that he would do it at home.  He is signing out AMA since he is not following specific advice.  I have advised him to establish with the primary care physician.  He probably also needs to have a sleep study scheduled.     Lyn Records, M.D.     HWS/MEDQ  D:  07/22/2011  T:  07/22/2011  Job:  [id number]  cc:   Doylene Canning. Ladona Ridgel, MD  Electronically Signed by Verdis Prime M.D. on 07/24/2011 08:11:08 PM

## 2011-08-13 ENCOUNTER — Observation Stay (HOSPITAL_COMMUNITY)
Admission: EM | Admit: 2011-08-13 | Discharge: 2011-08-15 | Disposition: A | Discharge status: Home / Self Care | Payer: Managed Care, Other (non HMO) | Attending: Interventional Cardiology | Admitting: Interventional Cardiology

## 2011-08-13 ENCOUNTER — Emergency Department (HOSPITAL_COMMUNITY): Payer: Managed Care, Other (non HMO)

## 2011-08-13 DIAGNOSIS — G8929 Other chronic pain: Secondary | ICD-10-CM | POA: Insufficient documentation

## 2011-08-13 DIAGNOSIS — Z23 Encounter for immunization: Secondary | ICD-10-CM | POA: Insufficient documentation

## 2011-08-13 DIAGNOSIS — N183 Chronic kidney disease, stage 3 unspecified: Secondary | ICD-10-CM | POA: Insufficient documentation

## 2011-08-13 DIAGNOSIS — I509 Heart failure, unspecified: Secondary | ICD-10-CM | POA: Insufficient documentation

## 2011-08-13 DIAGNOSIS — Z905 Acquired absence of kidney: Secondary | ICD-10-CM | POA: Insufficient documentation

## 2011-08-13 DIAGNOSIS — I5032 Chronic diastolic (congestive) heart failure: Secondary | ICD-10-CM | POA: Insufficient documentation

## 2011-08-13 DIAGNOSIS — R0789 Other chest pain: Secondary | ICD-10-CM | POA: Insufficient documentation

## 2011-08-13 DIAGNOSIS — I129 Hypertensive chronic kidney disease with stage 1 through stage 4 chronic kidney disease, or unspecified chronic kidney disease: Secondary | ICD-10-CM | POA: Insufficient documentation

## 2011-08-13 DIAGNOSIS — I428 Other cardiomyopathies: Secondary | ICD-10-CM | POA: Insufficient documentation

## 2011-08-13 DIAGNOSIS — R109 Unspecified abdominal pain: Principal | ICD-10-CM | POA: Insufficient documentation

## 2011-08-13 LAB — CK TOTAL AND CKMB (NOT AT ARMC)
CK, MB: 9.1 ng/mL (ref 0.3–4.0)
Total CK: 410 U/L — ABNORMAL HIGH (ref 7–232)

## 2011-08-13 LAB — PROTIME-INR: INR: 1 (ref 0.00–1.49)

## 2011-08-14 ENCOUNTER — Observation Stay (HOSPITAL_COMMUNITY): Payer: Managed Care, Other (non HMO)

## 2011-08-14 LAB — BASIC METABOLIC PANEL
BUN: 19 mg/dL (ref 6–23)
CO2: 25 mEq/L (ref 19–32)
Calcium: 9.6 mg/dL (ref 8.4–10.5)
Creatinine, Ser: 1.58 mg/dL — ABNORMAL HIGH (ref 0.50–1.35)
Glucose, Bld: 94 mg/dL (ref 70–99)

## 2011-08-14 LAB — COMPREHENSIVE METABOLIC PANEL
ALT: 29 U/L (ref 0–53)
AST: 53 U/L — ABNORMAL HIGH (ref 0–37)
Calcium: 9.2 mg/dL (ref 8.4–10.5)
Creatinine, Ser: 1.64 mg/dL — ABNORMAL HIGH (ref 0.50–1.35)
GFR calc Af Amer: 54 mL/min — ABNORMAL LOW (ref >60)
GFR calc non Af Amer: 45 mL/min — ABNORMAL LOW (ref >60)
Glucose, Bld: 96 mg/dL (ref 70–99)
Sodium: 134 mEq/L — ABNORMAL LOW (ref 135–145)
Total Protein: 6.8 g/dL (ref 6.0–8.3)

## 2011-08-14 LAB — CARDIAC PANEL(CRET KIN+CKTOT+MB+TROPI)
CK, MB: 8.1 ng/mL (ref 0.3–4.0)
Relative Index: 2.6 — ABNORMAL HIGH (ref 0.0–2.5)
Relative Index: 2.8 — ABNORMAL HIGH (ref 0.0–2.5)
Relative Index: 3 — ABNORMAL HIGH (ref 0.0–2.5)
Total CK: 304 U/L — ABNORMAL HIGH (ref 7–232)
Total CK: 290 U/L — ABNORMAL HIGH (ref 7–232)
Troponin I: <0.3 ng/mL (ref <0.30)

## 2011-08-14 LAB — DIFFERENTIAL
Basophils Relative: 0 % (ref 0–1)
Eosinophils Absolute: 0.1 10*3/uL (ref 0.0–0.7)
Eosinophils Relative: 3 % (ref 0–5)
Lymphs Abs: 1.1 10*3/uL (ref 0.7–4.0)
Monocytes Relative: 8 % (ref 3–12)

## 2011-08-14 LAB — CBC
MCH: 38.7 pg — ABNORMAL HIGH (ref 26.0–34.0)
MCHC: 36.5 g/dL — ABNORMAL HIGH (ref 30.0–36.0)
MCV: 106 fL — ABNORMAL HIGH (ref 78.0–100.0)
Platelets: 93 10*3/uL — ABNORMAL LOW (ref 150–400)
RDW: 12.6 % (ref 11.5–15.5)

## 2011-08-14 MED ORDER — TECHNETIUM TO 99M ALBUMIN AGGREGATED
6.0000 | Freq: Once | INTRAVENOUS | Status: AC | PRN
  Administered 2011-08-14: 6 via INTRAVENOUS

## 2011-08-14 MED ORDER — XENON XE 133 GAS
10.0000 | GAS_FOR_INHALATION | Freq: Once | RESPIRATORY_TRACT | Status: AC | PRN
  Administered 2011-08-14: 10 via RESPIRATORY_TRACT

## 2011-08-15 LAB — CARDIAC PANEL(CRET KIN+CKTOT+MB+TROPI)
Relative Index: 2.7 — ABNORMAL HIGH (ref 0.0–2.5)
Troponin I: <0.3 ng/mL (ref <0.30)

## 2011-08-25 NOTE — Discharge Summary (Signed)
[name], Moreno NO.:  [id number]  MEDICAL RECORD NO.:  [id number]  LOCATION:  4705                         FACILITY:  [facility]  PHYSICIAN:  Lyn Records, M.D.   DATE OF BIRTH:  01/04/1961  DATE OF ADMISSION:  08/13/2011 DATE OF DISCHARGE:  08/15/2011                              DISCHARGE SUMMARY   The patient has no primary physician.  DISCHARGE DIAGNOSES: 1. Left lateral abdominal discomfort uncertain of the exact cause but     likely due to abdominal hernia at the site of previous incisions     from nephrectomy. 2. Hypertrophic cardiomyopathy.     a.     Status post septal myectomy. 3. Compensated chronic diastolic heart failure due to hypertrophic     cardiomyopathy. 4. Chronic kidney disease type 3.     a.     History of left nephrectomy. 5. Chronic recurrent chest pain of musculoskeletal origin. 6. Hypertension.  PROCEDURES PERFORMED: 1. Abdominal ultrasound. 2. Ventilation-perfusion scan both performed on August 14, 2011.  DISCHARGE PLANS:  MEDICATIONS: 1. Aspirin 81 mg per day. 2. Enalapril 10 mg twice per day. 3. Imodium 1-2 tablets as needed three times per day for diarrhea. 4. Meclizine 25 mg as needed for vertigo. 5. Metoprolol succinate 100 mg tablets 100 in the morning and 50 mg in     each evening. 6. Multiple vitamins one per day. 7. Verapamil SR 240 mg per day.  ACTIVITY:  As tolerated.  FOLLOWUP:  As mentioned on previous documents at discharge, the patient does not come to outpatient followup to avoid having to pay.  He has a high deductible hospital insurance plan only without outpatient coverage.  He also tries and has a greatest relationship with a physician in [place], [place] that he sees intermittently.  This is a situation where he is admitted to the hospital any time he shows up because of his complicated history and the inability of those seeing him for a brief encounter in the emergency room to feel  comfortable discharging him from the ER.  We need to develop some intervention to prevent needless re-hospitalizations.  ACTIVITY:  As tolerated.  COMMENTS:  [name] is 69 and has had multiple hospitalizations for episodes of chest pain and other symptoms.  On this occasion, he became concerned about a bulge and slight discomfort in the left lower quadrant/left flank within the incision line of his left nephrectomy. He was hospitalized and multiple tests were performed based upon abnormalities that were not relevant to the admitting complaint.  For some reason, a D-dimer was obtained and he subsequently underwent a V/Q scan.  Subsequently even after the bulge in his abdominal area had dissipated, he underwent ultrasound testing, which was unremarkable.  Laboratory data did not reveal any significant changes.  The patient always has an elevated CPK with mild elevation in MB fractions but a normal relative index.  On this occasion, he did not complain of chest discomfort.  He is requesting to go home from the hospital.  He does not need to have further evaluation.  His EKG looks like his typical EKG with left bundle- branch block and normal sinus rhythm  with left axis deviation.  His condition at discharge is improved.  He states that he is going to be driving to [place] this Friday to see his physician.  As stated on discharge summary, the patient has a sleep disorder and needs a sleep study but has refused to get it scheduled here because he is concerned about paying for it.     Lyn Records, M.D.     HWS/MEDQ  D:  08/15/2011  T:  08/16/2011  Job:  [id number]  Electronically Signed by Verdis Prime M.D. on 08/25/2011 11:01:42 AM

## 2011-09-02 NOTE — H&P (Signed)
[name] Moreno, [name] Moreno               ACCOUNT NO.:  [id number]  MEDICAL RECORD NO.:  [id number]  LOCATION:                                 FACILITY:  PHYSICIAN:  Armanda Magic, M.D.     DATE OF BIRTH:  1961/03/24  DATE OF ADMISSION: DATE OF DISCHARGE:                             HISTORY & PHYSICAL   REFERRING PHYSICIAN:  Dr. Rubin Payor.  CHIEF COMPLAINTS:  Chest pain.  HISTORY OF PRESENT ILLNESS:  This is a 50 year old male with a history of cardiac catheterization in March 2012, with no coronary artery disease documented at that time.  He also has a history of hypertrophic obstructive cardiomyopathy status post AICD with septal myomectomy, chronic kidney disease, and leukopenia with recurrent chest pain, who presented again to the ER with chest pain.  He says that the chest pain is similar to what he had prior to his cath.  It is located midsternal with radiation to the upper chest on both sides and is increased with deep breathing and movement.  He is also complaining of significant left upper quadrant pain into his left flank.  He has had some fatigue, shortness of breath, and diaphoresis.  He states that currently, the pain in his left upper quadrant and flank is 9/10, although he appears very comfortable.  PAST MEDICAL HISTORY: 1. Hypertrophic obstructive cardiomyopathy status post septal     myomectomy.  He is also status post AICD implantation for HOCM with     a family history of sudden cardiac death. 2. 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 3. Leukopenia. 4. Hypertension. 5. Hypertriglyceridemia. 6. Recurrent chest pain, felt secondary to musculoskeletal etiology     with no coronary artery disease documented by cath in March 2012.  PAST SURGICAL HISTORY: 1. AICD implantation. 2. Septal myomectomy in 2011. 3. Left nephrectomy.  SOCIAL HISTORY:  He denies any tobacco or IV drug use.  He occasionally has alcohol use.  FAMILY HISTORY:  There  is a history of hypertrophic obstructive cardiomyopathy with sudden cardiac death.  ALLERGIES:  PENICILLIN, IV CONTRAST, VITAMIN K.  REVIEW OF SYSTEMS:  Negative except for HPI.  MEDICATIONS: 1. Toprol-XL 150 mg; 100 mg in the morning, 50 mg in the evening. 2. Verapamil SR 240 mg daily. 3. Multivitamin. 4. Aspirin 81 mg a day. 5. Imodium p.r.n. 6. Enalapril 10 mg b.i.d. 7. Furosemide 40 mg p.r.n. for edema.  PHYSICAL EXAMINATION:  VITAL SIGNS:  Blood pressure is 132/84, heart rate is 75. GENERAL:  This is a well-developed, well-nourished male in no acute distress. HEENT:  Benign. NECK:  Supple without lymphadenopathy.  Carotid upstroke is +2 bilaterally.  No bruits. LUNGS:  Clear to auscultation throughout. HEART:  Regular rate and rhythm.  No murmurs, rubs, or gallops.  Normal S1, S2. ABDOMEN:  Soft, nontender, nondistended with active bowel sounds.  No hepatosplenomegaly. EXTREMITIES:  No cyanosis, erythema, or edema.  LABORATORY DATA:  Sodium 134, potassium 4.3, chloride 104, bicarb 22, BUN 20, creatinine 1.64, glucose 96.  AST 53.  INR is 1.  BNP 647. Troponin less than 0.3, CPK 410, MB __________ , relative index 2.2. Chest x-ray shows no active disease.  EKG  shows sinus rhythm with left bundle-branch block aberration.  ASSESSMENT: 1. Chest pain, very atypical and reproducible with palpation of his     chest wall.  CPK and MB are elevated, but relative index and     troponin are normal.  He had a cath in March 2012, which showed no     coronary artery disease. 2. Hypertrophic obstructive cardiomyopathy status post septal     myomectomy and automated implantable cardioverter defibrillator. 3. Elevated CPK, MB with normal relative index and troponin,     questionable skeletal muscle release. 4. Left upper abdominal and left flank pain.  PLAN:  Admit to telemetry bed.  Cycle cardiac enzymes.  We will give morphine for pain.  Check lipase, amylase, and D-dimer.  Lasix  20 mg IV now.  We will check a BMET and BNP in the morning.  No anticoagulation for now unless relative index and troponin become positive since he had a cath 6 months ago that was normal.  Further workup per Dr. Verdis Prime.     Armanda Magic, M.D.     TT/MEDQ  D:  08/14/2011  T:  08/14/2011  Job:  [id number]  cc:   Lyn Records, M.D.  Electronically Signed by Armanda Magic M.D. on 09/02/2011 08:22:46 PM

## 2011-09-06 ENCOUNTER — Encounter: Payer: Self-pay | Admitting: Internal Medicine

## 2011-09-11 ENCOUNTER — Emergency Department: Payer: Self-pay | Admitting: Unknown Physician Specialty

## 2011-09-13 ENCOUNTER — Ambulatory Visit (INDEPENDENT_AMBULATORY_CARE_PROVIDER_SITE_OTHER): Payer: Managed Care, Other (non HMO) | Admitting: Internal Medicine

## 2011-09-13 ENCOUNTER — Encounter: Payer: Self-pay | Admitting: Internal Medicine

## 2011-09-13 ENCOUNTER — Encounter: Payer: Managed Care, Other (non HMO) | Admitting: Internal Medicine

## 2011-09-13 DIAGNOSIS — I5032 Chronic diastolic (congestive) heart failure: Secondary | ICD-10-CM

## 2011-09-13 DIAGNOSIS — Z8679 Personal history of other diseases of the circulatory system: Secondary | ICD-10-CM

## 2011-09-13 DIAGNOSIS — Z95810 Presence of automatic (implantable) cardiac defibrillator: Secondary | ICD-10-CM

## 2011-09-13 DIAGNOSIS — I509 Heart failure, unspecified: Secondary | ICD-10-CM

## 2011-09-13 DIAGNOSIS — I4901 Ventricular fibrillation: Secondary | ICD-10-CM

## 2011-09-13 LAB — ICD DEVICE OBSERVATION
BATTERY VOLTAGE: 3.22 V
RV LEAD AMPLITUDE: 13.3 mv
RV LEAD THRESHOLD: 1 V
TZAT-0001FASTVT: 1
TZAT-0020SLOWVT: 1.5 ms
TZON-0003SLOWVT: 360 ms
TZON-0003VSLOWVT: 350 ms
TZON-0004SLOWVT: 16
TZST-0001FASTVT: 2
TZST-0001FASTVT: 3
TZST-0001FASTVT: 4
TZST-0001SLOWVT: 2
TZST-0001SLOWVT: 3
TZST-0002FASTVT: NEGATIVE
TZST-0002FASTVT: NEGATIVE
TZST-0002SLOWVT: NEGATIVE
TZST-0002SLOWVT: NEGATIVE
TZST-0002SLOWVT: NEGATIVE
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
## 2011-09-13 NOTE — Assessment & Plan Note (Signed)
He has had no recurrent ventricular arrhythmias. We'll continue watchful waiting.

## 2011-09-13 NOTE — Assessment & Plan Note (Signed)
His symptoms are class II. He has been working out. He does admit to some sodium indiscretion. We discussed the possibility of taking Lasix as his optivol is elevated. Instead he will reduce his salt intake. He will continue his current medical therapy. He is awaiting sleep study

## 2011-09-13 NOTE — Assessment & Plan Note (Signed)
His device is working normally. We'll plan to recheck in several months. 

## 2011-09-13 NOTE — Patient Instructions (Signed)
Your physician wants you to follow-up in: 12 months with Dr Allred You will receive a reminder letter in the mail two months in advance. If you don't receive a letter, please call our office to schedule the follow-up appointment.  

## 2011-09-16 NOTE — H&P (Signed)
  NAMEZAYVEN, POWE NO.:  [id number]  MEDICAL RECORD NO.:  [id number]  LOCATION:  [facility]                         FACILITY:  [facility]  PHYSICIAN:  Duke Salvia, MD, FACCDATE OF BIRTH:  August 18, 1961  DATE OF ADMISSION:  05/09/2011 DATE OF DISCHARGE:  05/09/2011                             HISTORY & PHYSICAL   The patient presented for consideration of ICD reimplantation following extraction some 4 weeks before.  After discussion with Dr. Orvan Falconer, it was elected not to proceed with device reimplantation.  The patient was not admitted to hospital.  The patient was discharged from the short to day center.  He was to have surveillance cultures drawn and reimplantation was to be postponed.  PHYSICAL EXAMINATION:  VITAL SIGNS:  On examination that day, his blood pressure was 135/76, his pulse was 68. LUNGS:  Clear. HEART:  Sounds were regular. ABDOMEN:  Soft. EXTREMITIES:  There was no clubbing, cyanosis, or edema.  IMPRESSION:  Previously explanted device with device infection with anticipated reimplantation.     Duke Salvia, MD, [facility]     SCK/MEDQ  D:  09/09/2011  T:  09/09/2011  Job:  [id number]  Electronically Signed by Sherryl Manges MD [facility] on 09/16/2011 05:58:45 PM

## 2011-10-18 ENCOUNTER — Emergency Department (HOSPITAL_COMMUNITY)
Admission: EM | Admit: 2011-10-18 | Discharge: 2011-10-19 | Disposition: A | Discharge status: Home / Self Care | Payer: Managed Care, Other (non HMO) | Attending: Emergency Medicine | Admitting: Emergency Medicine

## 2011-10-18 ENCOUNTER — Encounter (HOSPITAL_COMMUNITY): Payer: Self-pay | Admitting: *Deleted

## 2011-10-18 DIAGNOSIS — Z79899 Other long term (current) drug therapy: Secondary | ICD-10-CM | POA: Insufficient documentation

## 2011-10-18 DIAGNOSIS — I10 Essential (primary) hypertension: Secondary | ICD-10-CM | POA: Insufficient documentation

## 2011-10-18 DIAGNOSIS — M549 Dorsalgia, unspecified: Secondary | ICD-10-CM

## 2011-10-18 DIAGNOSIS — I5030 Unspecified diastolic (congestive) heart failure: Secondary | ICD-10-CM | POA: Insufficient documentation

## 2011-10-18 DIAGNOSIS — Z95810 Presence of automatic (implantable) cardiac defibrillator: Secondary | ICD-10-CM | POA: Insufficient documentation

## 2011-10-18 DIAGNOSIS — E785 Hyperlipidemia, unspecified: Secondary | ICD-10-CM | POA: Insufficient documentation

## 2011-10-18 DIAGNOSIS — M25519 Pain in unspecified shoulder: Secondary | ICD-10-CM | POA: Insufficient documentation

## 2011-10-18 NOTE — ED Notes (Signed)
Pt reports having right shoulder pain that radiates to his right shoulder blade since this past Friday. States that it feels like "his stretch marks are on fire". Pt reports not taking any medication for the pain. Pt states that he thinks it may be a muscle strain but came to the ER for further evaluation. Pt concerned because he just had a defibrillator replacement placed this past June. Pt reports pain is 8/10. Pt is presently sitting in triage using his cell phone. No evidence of muscular pain noted.

## 2011-10-19 MED ORDER — CYCLOBENZAPRINE HCL 10 MG PO TABS: 10.0000 mg | ORAL_TABLET | Freq: Two times a day (BID) | ORAL | Status: AC | PRN

## 2011-10-19 MED ORDER — IBUPROFEN 800 MG PO TABS: 800.0000 mg | ORAL_TABLET | Freq: Three times a day (TID) | ORAL | Status: AC

## 2011-10-19 NOTE — ED Provider Notes (Signed)
Medical screening examination/treatment/procedure(s) were performed by non-physician practitioner and as supervising physician I was immediately available for consultation/collaboration.  Raeford Razor, MD 10/19/11 [phone]
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
## 2011-11-04 ENCOUNTER — Other Ambulatory Visit: Payer: Self-pay

## 2011-11-04 ENCOUNTER — Emergency Department (HOSPITAL_COMMUNITY)
Admission: EM | Admit: 2011-11-04 | Discharge: 2011-11-04 | Disposition: A | Discharge status: Home / Self Care | Payer: Managed Care, Other (non HMO) | Attending: Emergency Medicine | Admitting: Emergency Medicine

## 2011-11-04 ENCOUNTER — Emergency Department (HOSPITAL_COMMUNITY): Payer: Managed Care, Other (non HMO)

## 2011-11-04 ENCOUNTER — Encounter (HOSPITAL_COMMUNITY): Payer: Self-pay | Admitting: Emergency Medicine

## 2011-11-04 DIAGNOSIS — N189 Chronic kidney disease, unspecified: Secondary | ICD-10-CM | POA: Insufficient documentation

## 2011-11-04 DIAGNOSIS — R0602 Shortness of breath: Secondary | ICD-10-CM | POA: Insufficient documentation

## 2011-11-04 DIAGNOSIS — J40 Bronchitis, not specified as acute or chronic: Secondary | ICD-10-CM

## 2011-11-04 DIAGNOSIS — Z95810 Presence of automatic (implantable) cardiac defibrillator: Secondary | ICD-10-CM | POA: Insufficient documentation

## 2011-11-04 DIAGNOSIS — I129 Hypertensive chronic kidney disease with stage 1 through stage 4 chronic kidney disease, or unspecified chronic kidney disease: Secondary | ICD-10-CM | POA: Insufficient documentation

## 2011-11-04 DIAGNOSIS — Z79899 Other long term (current) drug therapy: Secondary | ICD-10-CM | POA: Insufficient documentation

## 2011-11-04 DIAGNOSIS — Z7982 Long term (current) use of aspirin: Secondary | ICD-10-CM | POA: Insufficient documentation

## 2011-11-04 DIAGNOSIS — R05 Cough: Secondary | ICD-10-CM | POA: Insufficient documentation

## 2011-11-04 DIAGNOSIS — R0789 Other chest pain: Secondary | ICD-10-CM | POA: Insufficient documentation

## 2011-11-04 DIAGNOSIS — R509 Fever, unspecified: Secondary | ICD-10-CM | POA: Insufficient documentation

## 2011-11-04 DIAGNOSIS — R059 Cough, unspecified: Secondary | ICD-10-CM | POA: Insufficient documentation

## 2011-11-04 LAB — PRO B NATRIURETIC PEPTIDE: Pro B Natriuretic peptide (BNP): 1500 pg/mL — ABNORMAL HIGH (ref 0–125)

## 2011-11-04 LAB — POCT I-STAT TROPONIN I: Troponin i, poc: 0.07 ng/mL (ref 0.00–0.08)

## 2011-11-04 MED ORDER — PREDNISONE 10 MG PO TABS: 20.0000 mg | ORAL_TABLET | Freq: Every day | ORAL | Status: DC

## 2011-11-04 MED ORDER — ACETAMINOPHEN-CODEINE #3 300-30 MG PO TABS: 1.0000 | ORAL_TABLET | Freq: Four times a day (QID) | ORAL | Status: AC | PRN

## 2011-11-04 MED ORDER — ACETAMINOPHEN-CODEINE #3 300-30 MG PO TABS
2.0000 | ORAL_TABLET | Freq: Once | ORAL | Status: AC
  Administered 2011-11-04: 2 via ORAL
  Filled 2011-11-04: qty 2

## 2011-11-04 MED ORDER — FUROSEMIDE 20 MG PO TABS
80.0000 mg | ORAL_TABLET | Freq: Once | ORAL | Status: AC
  Administered 2011-11-04: 80 mg via ORAL
  Filled 2011-11-04: qty 4
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
## 2011-11-04 NOTE — ED Notes (Signed)
Pt reports that pain initially began in his neck and throat and then trickled down into his chest.  Pt states he was having trouble breathing and pain is worse on deep breathing.

## 2011-11-04 NOTE — Discharge Instructions (Signed)
Bronchitis Bronchitis is the body's way of reacting to injury and/or infection (inflammation) of the bronchi. Bronchi are the air tubes that extend from the windpipe into the lungs. If the inflammation becomes severe, it may cause shortness of breath. CAUSES  Inflammation may be caused by:  A virus.   Germs (bacteria).   Dust.   Allergens.   Pollutants and many other irritants.  The cells lining the bronchial tree are covered with tiny hairs (cilia). These constantly beat upward, away from the lungs, toward the mouth. This keeps the lungs free of pollutants. When these cells become too irritated and are unable to do their job, mucus begins to develop. This causes the characteristic cough of bronchitis. The cough clears the lungs when the cilia are unable to do their job. Without either of these protective mechanisms, the mucus would settle in the lungs. Then you would develop pneumonia. Smoking is a common cause of bronchitis and can contribute to pneumonia. Stopping this habit is the single most important thing you can do to help yourself. TREATMENT   Your caregiver may prescribe an antibiotic if the cough is caused by bacteria. Also, medicines that open up your airways make it easier to breathe. Your caregiver may also recommend or prescribe an expectorant. It will loosen the mucus to be coughed up. Only take over-the-counter or prescription medicines for pain, discomfort, or fever as directed by your caregiver.   Removing whatever causes the problem (smoking, for example) is critical to preventing the problem from getting worse.   Cough suppressants may be prescribed for relief of cough symptoms.   Inhaled medicines may be prescribed to help with symptoms now and to help prevent problems from returning.   For those with recurrent (chronic) bronchitis, there may be a need for steroid medicines.  SEEK IMMEDIATE MEDICAL CARE IF:   During treatment, you develop more pus-like mucus  (purulent sputum).   You have a fever.   Your baby is older than 3 months with a rectal temperature of 102 F (38.9 C) or higher.   Your baby is 3 months old or younger with a rectal temperature of 100.4 F (38 C) or higher.   You become progressively more ill.   You have increased difficulty breathing, wheezing, or shortness of breath.  It is necessary to seek immediate medical care if you are elderly or sick from any other disease. MAKE SURE YOU:   Understand these instructions.   Will watch your condition.   Will get help right away if you are not doing well or get worse.  Document Released: 10/31/2005 Document Revised: 07/13/2011 Document Reviewed: 09/09/2008 [employer] Patient Information 2012 [employer]. 

## 2011-12-07 ENCOUNTER — Encounter (HOSPITAL_COMMUNITY): Payer: Self-pay

## 2011-12-07 ENCOUNTER — Emergency Department (HOSPITAL_COMMUNITY)
Admission: EM | Admit: 2011-12-07 | Discharge: 2011-12-07 | Disposition: A | Discharge status: Home / Self Care | Payer: Managed Care, Other (non HMO) | Attending: Emergency Medicine | Admitting: Emergency Medicine

## 2011-12-07 DIAGNOSIS — S0500XA Injury of conjunctiva and corneal abrasion without foreign body, unspecified eye, initial encounter: Secondary | ICD-10-CM | POA: Insufficient documentation

## 2011-12-07 DIAGNOSIS — I10 Essential (primary) hypertension: Secondary | ICD-10-CM | POA: Insufficient documentation

## 2011-12-07 DIAGNOSIS — H5710 Ocular pain, unspecified eye: Secondary | ICD-10-CM | POA: Insufficient documentation

## 2011-12-07 DIAGNOSIS — X58XXXA Exposure to other specified factors, initial encounter: Secondary | ICD-10-CM | POA: Insufficient documentation

## 2011-12-07 DIAGNOSIS — E781 Pure hyperglyceridemia: Secondary | ICD-10-CM | POA: Insufficient documentation

## 2011-12-07 MED ORDER — ERYTHROMYCIN 5 MG/GM OP OINT: TOPICAL_OINTMENT | OPHTHALMIC | Status: AC

## 2011-12-07 MED ORDER — FLUORESCEIN SODIUM 1 MG OP STRP
1.0000 | ORAL_STRIP | Freq: Once | OPHTHALMIC | Status: AC
  Administered 2011-12-07: 1 via OPHTHALMIC
  Filled 2011-12-07: qty 1

## 2011-12-07 MED ORDER — HYDROCODONE-ACETAMINOPHEN 5-325 MG PO TABS: 1.0000 | ORAL_TABLET | ORAL | Status: AC | PRN

## 2011-12-07 MED ORDER — PROPARACAINE HCL 0.5 % OP SOLN
1.0000 [drp] | Freq: Once | OPHTHALMIC | Status: AC
  Administered 2011-12-07: 1 [drp] via OPHTHALMIC
  Filled 2011-12-07 (×2): qty 15
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
## 2011-12-07 NOTE — ED Notes (Signed)
Rt. Eye pain and redness began yesterday, denies any injury,denies any blurry vision feels like there is something in it when he closes his eye

## 2011-12-08 NOTE — ED Provider Notes (Signed)
Medical screening examination/treatment/procedure(s) were performed by non-physician practitioner and as supervising physician I was immediately available for consultation/collaboration. Sladen Plancarte Y.   Gavin Pound. Oletta Lamas, MD 12/08/11 [phone]

## 2011-12-15 ENCOUNTER — Encounter: Payer: Managed Care, Other (non HMO) | Admitting: *Deleted

## 2011-12-19 ENCOUNTER — Encounter: Payer: Self-pay | Admitting: *Deleted

## 2011-12-27 ENCOUNTER — Telehealth: Payer: Self-pay | Admitting: Internal Medicine

## 2011-12-27 NOTE — Telephone Encounter (Signed)
New Msg: Pt calling wanting to speak with someone in the device clinic regarding pt sending remote device check. Please return pt call to discuss further.

## 2011-12-29 ENCOUNTER — Ambulatory Visit (INDEPENDENT_AMBULATORY_CARE_PROVIDER_SITE_OTHER): Payer: Managed Care, Other (non HMO) | Admitting: *Deleted

## 2011-12-29 ENCOUNTER — Encounter: Payer: Self-pay | Admitting: Internal Medicine

## 2011-12-29 DIAGNOSIS — I5032 Chronic diastolic (congestive) heart failure: Secondary | ICD-10-CM

## 2011-12-29 DIAGNOSIS — Z95810 Presence of automatic (implantable) cardiac defibrillator: Secondary | ICD-10-CM

## 2011-12-29 DIAGNOSIS — I509 Heart failure, unspecified: Secondary | ICD-10-CM

## 2011-12-29 NOTE — Telephone Encounter (Signed)
Spoke w/pt in regards to transmitter. Answered all questions and pt to send transmission this evening.

## 2011-12-30 LAB — REMOTE ICD DEVICE
CHARGE TIME: 8.308 s
DEV-0020ICD: NEGATIVE
FVT: 0
RV LEAD IMPEDENCE ICD: 380 Ohm
RV LEAD THRESHOLD: 1.125 V
TOT-0001: 2
TOT-0002: 0
TOT-0006: 20120711000000
TZAT-0001FASTVT: 1
TZAT-0012SLOWVT: 200 ms
TZAT-0018FASTVT: NEGATIVE
TZAT-0018SLOWVT: NEGATIVE
TZAT-0019FASTVT: 8 V
TZAT-0020SLOWVT: 1.5 ms
TZON-0003SLOWVT: 360 ms
TZON-0003VSLOWVT: 350 ms
TZON-0004SLOWVT: 16
TZST-0001FASTVT: 2
TZST-0001FASTVT: 4
TZST-0001FASTVT: 6
TZST-0001SLOWVT: 2
TZST-0001SLOWVT: 6
TZST-0002FASTVT: NEGATIVE
TZST-0002FASTVT: NEGATIVE
TZST-0002SLOWVT: NEGATIVE

## 2012-01-06 NOTE — Progress Notes (Signed)
ICD remote with ICM 

## 2012-01-11 ENCOUNTER — Encounter: Payer: Self-pay | Admitting: *Deleted

## 2012-03-21 ENCOUNTER — Encounter: Payer: Self-pay | Admitting: Internal Medicine

## 2012-03-22 ENCOUNTER — Ambulatory Visit (INDEPENDENT_AMBULATORY_CARE_PROVIDER_SITE_OTHER): Payer: Managed Care, Other (non HMO) | Admitting: *Deleted

## 2012-03-22 DIAGNOSIS — I509 Heart failure, unspecified: Secondary | ICD-10-CM

## 2012-03-22 DIAGNOSIS — I5032 Chronic diastolic (congestive) heart failure: Secondary | ICD-10-CM

## 2012-03-22 DIAGNOSIS — I428 Other cardiomyopathies: Secondary | ICD-10-CM

## 2012-03-22 LAB — REMOTE ICD DEVICE
BATTERY VOLTAGE: 3.1973 V
BRDY-0002RV: 40 {beats}/min
FVT: 0
RV LEAD AMPLITUDE: 18.125 mv
RV LEAD THRESHOLD: 1.125 V
TOT-0001: 2
TZAT-0001FASTVT: 1
TZAT-0001SLOWVT: 1
TZAT-0002SLOWVT: NEGATIVE
TZAT-0012FASTVT: 200 ms
TZAT-0018FASTVT: NEGATIVE
TZST-0001FASTVT: 4
TZST-0001FASTVT: 5
TZST-0001FASTVT: 6
TZST-0001SLOWVT: 3
TZST-0001SLOWVT: 4
TZST-0001SLOWVT: 5
TZST-0002FASTVT: NEGATIVE
TZST-0002FASTVT: NEGATIVE
TZST-0002SLOWVT: NEGATIVE
TZST-0002SLOWVT: NEGATIVE
VENTRICULAR PACING ICD: 0.01 pct
VF: 0

## 2012-03-30 ENCOUNTER — Encounter: Payer: Self-pay | Admitting: *Deleted

## 2012-04-06 NOTE — Progress Notes (Signed)
ICD remote with ICM 

## 2012-06-24 ENCOUNTER — Observation Stay (HOSPITAL_COMMUNITY)
Admission: EM | Admit: 2012-06-24 | Discharge: 2012-06-25 | Disposition: A | Discharge status: Home / Self Care | Payer: No Typology Code available for payment source | Attending: Family Medicine | Admitting: Family Medicine

## 2012-06-24 ENCOUNTER — Encounter (HOSPITAL_COMMUNITY): Payer: Self-pay | Admitting: *Deleted

## 2012-06-24 ENCOUNTER — Other Ambulatory Visit: Payer: Self-pay

## 2012-06-24 ENCOUNTER — Emergency Department (HOSPITAL_COMMUNITY): Payer: No Typology Code available for payment source

## 2012-06-24 DIAGNOSIS — M25579 Pain in unspecified ankle and joints of unspecified foot: Secondary | ICD-10-CM | POA: Insufficient documentation

## 2012-06-24 DIAGNOSIS — M79609 Pain in unspecified limb: Secondary | ICD-10-CM

## 2012-06-24 DIAGNOSIS — I421 Obstructive hypertrophic cardiomyopathy: Secondary | ICD-10-CM

## 2012-06-24 DIAGNOSIS — I129 Hypertensive chronic kidney disease with stage 1 through stage 4 chronic kidney disease, or unspecified chronic kidney disease: Secondary | ICD-10-CM | POA: Insufficient documentation

## 2012-06-24 DIAGNOSIS — N189 Chronic kidney disease, unspecified: Secondary | ICD-10-CM | POA: Insufficient documentation

## 2012-06-24 DIAGNOSIS — I10 Essential (primary) hypertension: Secondary | ICD-10-CM

## 2012-06-24 DIAGNOSIS — Z8679 Personal history of other diseases of the circulatory system: Secondary | ICD-10-CM

## 2012-06-24 DIAGNOSIS — M79604 Pain in right leg: Secondary | ICD-10-CM

## 2012-06-24 DIAGNOSIS — R0789 Other chest pain: Principal | ICD-10-CM | POA: Insufficient documentation

## 2012-06-24 DIAGNOSIS — R079 Chest pain, unspecified: Secondary | ICD-10-CM

## 2012-06-24 DIAGNOSIS — I422 Other hypertrophic cardiomyopathy: Secondary | ICD-10-CM | POA: Insufficient documentation

## 2012-06-24 DIAGNOSIS — Z95810 Presence of automatic (implantable) cardiac defibrillator: Secondary | ICD-10-CM | POA: Diagnosis present

## 2012-06-24 DIAGNOSIS — M79661 Pain in right lower leg: Secondary | ICD-10-CM

## 2012-06-24 DIAGNOSIS — M79605 Pain in left leg: Secondary | ICD-10-CM

## 2012-06-24 DIAGNOSIS — I5032 Chronic diastolic (congestive) heart failure: Secondary | ICD-10-CM | POA: Diagnosis present

## 2012-06-24 HISTORY — DX: Acute myocardial infarction, unspecified: I21.9

## 2012-06-24 HISTORY — DX: Atherosclerotic heart disease of native coronary artery without angina pectoris: I25.10

## 2012-06-24 HISTORY — DX: Other hypertrophic cardiomyopathy: I42.2

## 2012-06-24 LAB — CBC WITH DIFFERENTIAL/PLATELET
Basophils Absolute: 0 10*3/uL (ref 0.0–0.1)
HCT: 46.5 % (ref 39.0–52.0)
Hemoglobin: 17.4 g/dL — ABNORMAL HIGH (ref 13.0–17.0)
Lymphocytes Relative: 26 % (ref 12–46)
Monocytes Absolute: 0.3 10*3/uL (ref 0.1–1.0)
Neutro Abs: 3.1 10*3/uL (ref 1.7–7.7)
RBC: 4.42 MIL/uL (ref 4.22–5.81)
RDW: 15.3 % (ref 11.5–15.5)
WBC: 4.6 10*3/uL (ref 4.0–10.5)

## 2012-06-24 LAB — POCT I-STAT, CHEM 8
Chloride: 105 mEq/L (ref 96–112)
Creatinine, Ser: 1.5 mg/dL — ABNORMAL HIGH (ref 0.50–1.35)
Glucose, Bld: 152 mg/dL — ABNORMAL HIGH (ref 70–99)
HCT: 50 % (ref 39.0–52.0)
Potassium: 4.4 mEq/L (ref 3.5–5.1)

## 2012-06-24 LAB — D-DIMER, QUANTITATIVE: D-Dimer, Quant: 0.59 ug/mL-FEU — ABNORMAL HIGH (ref 0.00–0.48)

## 2012-06-24 MED ORDER — HYDROMORPHONE HCL PF 1 MG/ML IJ SOLN
1.0000 mg | Freq: Once | INTRAMUSCULAR | Status: AC
  Administered 2012-06-24: 1 mg via INTRAMUSCULAR
  Filled 2012-06-24: qty 1
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
## 2012-06-25 ENCOUNTER — Encounter (HOSPITAL_COMMUNITY): Payer: Self-pay | Admitting: *Deleted

## 2012-06-25 ENCOUNTER — Observation Stay (HOSPITAL_COMMUNITY): Payer: No Typology Code available for payment source

## 2012-06-25 DIAGNOSIS — M79604 Pain in right leg: Secondary | ICD-10-CM

## 2012-06-25 DIAGNOSIS — M79605 Pain in left leg: Secondary | ICD-10-CM

## 2012-06-25 LAB — CBC
HCT: 42.7 % (ref 39.0–52.0)
Hemoglobin: 15.5 g/dL (ref 13.0–17.0)
MCV: 105.7 fL — ABNORMAL HIGH (ref 78.0–100.0)
RBC: 4.04 MIL/uL — ABNORMAL LOW (ref 4.22–5.81)
RDW: 15.5 % (ref 11.5–15.5)
WBC: 4.1 10*3/uL (ref 4.0–10.5)

## 2012-06-25 LAB — URINALYSIS, ROUTINE W REFLEX MICROSCOPIC
Bilirubin Urine: NEGATIVE
Nitrite: NEGATIVE
Specific Gravity, Urine: 1.026 (ref 1.005–1.030)
Urobilinogen, UA: 0.2 mg/dL (ref 0.0–1.0)
pH: 5.5 (ref 5.0–8.0)

## 2012-06-25 LAB — BASIC METABOLIC PANEL
BUN: 17 mg/dL (ref 6–23)
Chloride: 102 mEq/L (ref 96–112)
GFR calc Af Amer: 69 mL/min — ABNORMAL LOW (ref >90)
GFR calc non Af Amer: 59 mL/min — ABNORMAL LOW (ref >90)
Glucose, Bld: 149 mg/dL — ABNORMAL HIGH (ref 70–99)
Potassium: 3.9 mEq/L (ref 3.5–5.1)
Sodium: 135 mEq/L (ref 135–145)

## 2012-06-25 LAB — CARDIAC PANEL(CRET KIN+CKTOT+MB+TROPI)
CK, MB: 9.4 ng/mL (ref 0.3–4.0)
Relative Index: 4.5 — ABNORMAL HIGH (ref 0.0–2.5)
Troponin I: <0.3 ng/mL (ref <0.30)

## 2012-06-25 LAB — URINE MICROSCOPIC-ADD ON

## 2012-06-25 MED ORDER — TRAMADOL HCL 50 MG PO TABS: 50.0000 mg | ORAL_TABLET | Freq: Four times a day (QID) | ORAL | Status: DC | PRN

## 2012-06-25 MED ORDER — METOPROLOL SUCCINATE ER 100 MG PO TB24
100.0000 mg | ORAL_TABLET | Freq: Every day | ORAL | Status: DC
  Administered 2012-06-25: 100 mg via ORAL
  Filled 2012-06-25: qty 1

## 2012-06-25 MED ORDER — ONDANSETRON HCL 4 MG/2ML IJ SOLN
INTRAMUSCULAR | Status: AC
  Filled 2012-06-25: qty 2

## 2012-06-25 MED ORDER — MORPHINE SULFATE 2 MG/ML IJ SOLN
2.0000 mg | INTRAMUSCULAR | Status: DC | PRN
  Administered 2012-06-25 (×3): 2 mg via INTRAVENOUS
  Filled 2012-06-25 (×3): qty 1

## 2012-06-25 MED ORDER — SODIUM CHLORIDE 0.9 % IJ SOLN: 3.0000 mL | Freq: Two times a day (BID) | INTRAMUSCULAR | Status: DC

## 2012-06-25 MED ORDER — ACETAMINOPHEN 500 MG PO TABS: 1000.0000 mg | ORAL_TABLET | Freq: Four times a day (QID) | ORAL | Status: DC | PRN

## 2012-06-25 MED ORDER — ONDANSETRON HCL 4 MG PO TABS: 4.0000 mg | ORAL_TABLET | Freq: Four times a day (QID) | ORAL | Status: DC | PRN

## 2012-06-25 MED ORDER — SODIUM CHLORIDE 0.9 % IV SOLN
250.0000 mL | INTRAVENOUS | Status: DC | PRN
Start: 2012-06-25 — End: 2012-06-25

## 2012-06-25 MED ORDER — SODIUM CHLORIDE 0.9 % IJ SOLN: 3.0000 mL | INTRAMUSCULAR | Status: DC | PRN

## 2012-06-25 MED ORDER — ASPIRIN EC 81 MG PO TBEC
81.0000 mg | DELAYED_RELEASE_TABLET | Freq: Every day | ORAL | Status: DC
  Administered 2012-06-25: 81 mg via ORAL
  Filled 2012-06-25: qty 1

## 2012-06-25 MED ORDER — TECHNETIUM TO 99M ALBUMIN AGGREGATED
3.0000 | Freq: Once | INTRAVENOUS | Status: AC | PRN
  Administered 2012-06-25: 3 via INTRAVENOUS

## 2012-06-25 MED ORDER — ONDANSETRON HCL 4 MG/2ML IJ SOLN
4.0000 mg | Freq: Four times a day (QID) | INTRAMUSCULAR | Status: DC | PRN
  Administered 2012-06-25: 4 mg via INTRAVENOUS

## 2012-06-25 MED ORDER — POLYETHYLENE GLYCOL 3350 17 G PO PACK
17.0000 g | PACK | Freq: Every day | ORAL | Status: DC | PRN
  Filled 2012-06-25: qty 1

## 2012-06-25 MED ORDER — XENON XE 133 GAS
20.0000 | GAS_FOR_INHALATION | Freq: Once | RESPIRATORY_TRACT | Status: AC | PRN
  Administered 2012-06-25: 20 via RESPIRATORY_TRACT

## 2012-06-25 MED ORDER — VERAPAMIL HCL ER 240 MG PO TBCR
240.0000 mg | EXTENDED_RELEASE_TABLET | Freq: Every day | ORAL | Status: DC
  Administered 2012-06-25: 240 mg via ORAL
  Filled 2012-06-25: qty 1

## 2012-06-25 MED ORDER — ENALAPRIL MALEATE 10 MG PO TABS
10.0000 mg | ORAL_TABLET | Freq: Two times a day (BID) | ORAL | Status: DC
  Administered 2012-06-25: 10 mg via ORAL
  Filled 2012-06-25 (×2): qty 1

## 2012-06-25 MED ORDER — ENOXAPARIN SODIUM 40 MG/0.4ML ~~LOC~~ SOLN
40.0000 mg | Freq: Every day | SUBCUTANEOUS | Status: DC
  Administered 2012-06-25: 40 mg via SUBCUTANEOUS
  Filled 2012-06-25: qty 0.4
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
## 2012-06-25 NOTE — Progress Notes (Signed)
CRITICAL VALUE ALERT  Critical value received: CK MB= 9.4  Date of notification:  06/25/12  Time of notification:  5:17 AM  Critical value read back:yes  Nurse who received alert:  Ernesto Rutherford  MD notified (1st page):  Dr.Hairford  Time of first page:  0512   Responding MD:  Dr. Mikel Cella  Time MD responded:  [phone]
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
## 2012-06-25 NOTE — Progress Notes (Signed)
Utilization review complete 

## 2012-06-25 NOTE — Progress Notes (Signed)
Pt c/o bilateral calf pain. Arrived on the unit at 0250. IV fluids continued at [facility] since pt c/o frequent urination and some burning with urination. Will send urine sample for UA. On assessment pt revealed to have hamstring pain with dorsiflexion of bilateral feet. Describes it as "ache" and does not radiate anywhere or does not feel on the joint. Will continue to monitor and medicate with pain meds to ensure comfort until MD re-evaluate.

## 2012-06-25 NOTE — Plan of Care (Signed)
Problem: Phase III Progression Outcomes Goal: Pain controlled on oral analgesia Outcome: Not Progressing Pain managed with IV morphine. Will re-evaluate and progress to PO pain meds. Goal: Activity at appropriate level-compared to baseline (UP IN CHAIR FOR HEMODIALYSIS) Outcome: Progressing Pt denies any weakness. Will monitor as goes through the disease process and intervene appropriately. Will consult PT if needed. Goal: Voiding independently Outcome: Completed/Met Date Met:  06/25/12 Pt c/o some burning while urinating. Will send urine sample for UA. Goal: IV/normal saline lock discontinued Outcome: Not Progressing On IV fluid KVO for now. Pt states he has been voiding more than usual. Will record I&O and receive UA result before DC fluids. Goal: Discharge plan remains appropriate-arrangements made Outcome: Progressing Plan to have a V/Q scan before DC.

## 2012-06-25 NOTE — Discharge Summary (Signed)
I interviewed and examined this patient and discussed the care plan with Dr. Mikel Cella and the [facility] team and agree with assessment and plan as documented in the discharge note for today.    Asna Muldrow A. Sheffield Slider, MD Family Medicine Teaching Service Attending  06/25/2012 8:29 PM

## 2012-06-26 ENCOUNTER — Encounter: Payer: Self-pay | Admitting: Internal Medicine

## 2012-06-28 ENCOUNTER — Ambulatory Visit (INDEPENDENT_AMBULATORY_CARE_PROVIDER_SITE_OTHER): Payer: Managed Care, Other (non HMO) | Admitting: *Deleted

## 2012-06-28 DIAGNOSIS — Z8679 Personal history of other diseases of the circulatory system: Secondary | ICD-10-CM

## 2012-06-28 DIAGNOSIS — Z95810 Presence of automatic (implantable) cardiac defibrillator: Secondary | ICD-10-CM

## 2012-06-28 DIAGNOSIS — I5032 Chronic diastolic (congestive) heart failure: Secondary | ICD-10-CM

## 2012-06-29 LAB — REMOTE ICD DEVICE
BATTERY VOLTAGE: 3.1875 V
BRDY-0002RV: 40 {beats}/min
CHARGE TIME: 8.428 s
RV LEAD AMPLITUDE: 15.1 mv
RV LEAD THRESHOLD: 1 V
TOT-0006: 20120711000000
TZAT-0001FASTVT: 1
TZAT-0002FASTVT: NEGATIVE
TZAT-0002SLOWVT: NEGATIVE
TZAT-0012FASTVT: 200 ms
TZAT-0019SLOWVT: 8 V
TZAT-0020FASTVT: 1.5 ms
TZAT-0020SLOWVT: 1.5 ms
TZON-0003VSLOWVT: 350 ms
TZON-0005SLOWVT: 12
TZST-0001FASTVT: 3
TZST-0001FASTVT: 4
TZST-0001FASTVT: 5
TZST-0001SLOWVT: 4
TZST-0001SLOWVT: 5
TZST-0001SLOWVT: 6
TZST-0002FASTVT: NEGATIVE
TZST-0002FASTVT: NEGATIVE
TZST-0002SLOWVT: NEGATIVE
VENTRICULAR PACING ICD: 0.01 pct
VF: 0

## 2012-07-09 ENCOUNTER — Emergency Department (HOSPITAL_COMMUNITY)
Admission: EM | Admit: 2012-07-09 | Discharge: 2012-07-09 | Disposition: A | Discharge status: Home / Self Care | Payer: Medicare Other | Attending: Emergency Medicine | Admitting: Emergency Medicine

## 2012-07-09 ENCOUNTER — Encounter (HOSPITAL_COMMUNITY): Payer: Self-pay | Admitting: Emergency Medicine

## 2012-07-09 DIAGNOSIS — N419 Inflammatory disease of prostate, unspecified: Secondary | ICD-10-CM | POA: Insufficient documentation

## 2012-07-09 DIAGNOSIS — N189 Chronic kidney disease, unspecified: Secondary | ICD-10-CM | POA: Insufficient documentation

## 2012-07-09 DIAGNOSIS — E781 Pure hyperglyceridemia: Secondary | ICD-10-CM | POA: Insufficient documentation

## 2012-07-09 DIAGNOSIS — I509 Heart failure, unspecified: Secondary | ICD-10-CM | POA: Insufficient documentation

## 2012-07-09 DIAGNOSIS — I129 Hypertensive chronic kidney disease with stage 1 through stage 4 chronic kidney disease, or unspecified chronic kidney disease: Secondary | ICD-10-CM | POA: Insufficient documentation

## 2012-07-09 DIAGNOSIS — M25579 Pain in unspecified ankle and joints of unspecified foot: Secondary | ICD-10-CM | POA: Insufficient documentation

## 2012-07-09 DIAGNOSIS — M5430 Sciatica, unspecified side: Secondary | ICD-10-CM | POA: Insufficient documentation

## 2012-07-09 DIAGNOSIS — I2510 Atherosclerotic heart disease of native coronary artery without angina pectoris: Secondary | ICD-10-CM | POA: Insufficient documentation

## 2012-07-09 DIAGNOSIS — Z91013 Allergy to seafood: Secondary | ICD-10-CM | POA: Insufficient documentation

## 2012-07-09 DIAGNOSIS — I252 Old myocardial infarction: Secondary | ICD-10-CM | POA: Insufficient documentation

## 2012-07-09 DIAGNOSIS — Z880 Allergy status to penicillin: Secondary | ICD-10-CM | POA: Insufficient documentation

## 2012-07-09 DIAGNOSIS — I5030 Unspecified diastolic (congestive) heart failure: Secondary | ICD-10-CM | POA: Insufficient documentation

## 2012-07-09 LAB — URINALYSIS, ROUTINE W REFLEX MICROSCOPIC
Bilirubin Urine: NEGATIVE
Ketones, ur: NEGATIVE mg/dL
Leukocytes, UA: NEGATIVE
Nitrite: NEGATIVE
Specific Gravity, Urine: 1.023 (ref 1.005–1.030)
Urobilinogen, UA: 0.2 mg/dL (ref 0.0–1.0)
pH: 6 (ref 5.0–8.0)

## 2012-07-09 LAB — CBC WITH DIFFERENTIAL/PLATELET
Eosinophils Absolute: 0.1 10*3/uL (ref 0.0–0.7)
Eosinophils Relative: 1 % (ref 0–5)
HCT: 46.2 % (ref 39.0–52.0)
Hemoglobin: 17 g/dL (ref 13.0–17.0)
Lymphocytes Relative: 26 % (ref 12–46)
Lymphs Abs: 1.3 10*3/uL (ref 0.7–4.0)
MCH: 39.9 pg — ABNORMAL HIGH (ref 26.0–34.0)
MCV: 108.5 fL — ABNORMAL HIGH (ref 78.0–100.0)
Monocytes Absolute: 0.4 10*3/uL (ref 0.1–1.0)
Monocytes Relative: 8 % (ref 3–12)
Platelets: 111 10*3/uL — ABNORMAL LOW (ref 150–400)
RBC: 4.26 MIL/uL (ref 4.22–5.81)

## 2012-07-09 LAB — COMPREHENSIVE METABOLIC PANEL
BUN: 19 mg/dL (ref 6–23)
CO2: 27 mEq/L (ref 19–32)
Calcium: 9.4 mg/dL (ref 8.4–10.5)
Creatinine, Ser: 1.46 mg/dL — ABNORMAL HIGH (ref 0.50–1.35)
GFR calc Af Amer: 63 mL/min — ABNORMAL LOW (ref >90)
GFR calc non Af Amer: 54 mL/min — ABNORMAL LOW (ref >90)
Glucose, Bld: 73 mg/dL (ref 70–99)
Total Protein: 7.1 g/dL (ref 6.0–8.3)

## 2012-07-09 LAB — URINE MICROSCOPIC-ADD ON

## 2012-07-09 MED ORDER — HYDROCODONE-ACETAMINOPHEN 5-325 MG PO TABS: 1.0000 | ORAL_TABLET | Freq: Four times a day (QID) | ORAL | Status: AC | PRN

## 2012-07-09 MED ORDER — CIPROFLOXACIN HCL 500 MG PO TABS
500.0000 mg | ORAL_TABLET | Freq: Once | ORAL | Status: AC
  Administered 2012-07-09: 500 mg via ORAL
  Filled 2012-07-09: qty 1

## 2012-07-09 MED ORDER — ALIGN PO CAPS: ORAL_CAPSULE | ORAL | Status: DC

## 2012-07-09 MED ORDER — HYDROCODONE-ACETAMINOPHEN 5-325 MG PO TABS: 1.0000 | ORAL_TABLET | Freq: Four times a day (QID) | ORAL | Status: DC | PRN

## 2012-07-09 MED ORDER — CIPROFLOXACIN HCL 500 MG PO TABS: 500.0000 mg | ORAL_TABLET | Freq: Two times a day (BID) | ORAL | Status: DC

## 2012-07-09 MED ORDER — CIPROFLOXACIN HCL 500 MG PO TABS: 500.0000 mg | ORAL_TABLET | Freq: Two times a day (BID) | ORAL | Status: AC
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
## 2012-07-09 NOTE — ED Notes (Signed)
Care assumed at this time,

## 2012-07-10 LAB — GC/CHLAMYDIA PROBE AMP, URINE
Chlamydia, Swab/Urine, PCR: NEGATIVE
GC Probe Amp, Urine: NEGATIVE

## 2012-07-10 LAB — URINE CULTURE: Colony Count: NO GROWTH

## 2012-07-17 ENCOUNTER — Encounter: Payer: Self-pay | Admitting: *Deleted

## 2012-07-19 ENCOUNTER — Inpatient Hospital Stay: Payer: Managed Care, Other (non HMO) | Admitting: Family Medicine

## 2012-07-23 ENCOUNTER — Inpatient Hospital Stay: Payer: Managed Care, Other (non HMO) | Admitting: Family Medicine

## 2012-08-02 ENCOUNTER — Encounter: Payer: Self-pay | Admitting: Family Medicine

## 2012-08-02 DIAGNOSIS — N183 Chronic kidney disease, stage 3 (moderate): Secondary | ICD-10-CM

## 2012-08-02 DIAGNOSIS — Z905 Acquired absence of kidney: Secondary | ICD-10-CM | POA: Insufficient documentation

## 2012-08-02 DIAGNOSIS — Z9889 Other specified postprocedural states: Secondary | ICD-10-CM | POA: Insufficient documentation

## 2012-08-02 DIAGNOSIS — Z8674 Personal history of sudden cardiac arrest: Secondary | ICD-10-CM | POA: Insufficient documentation

## 2012-08-03 ENCOUNTER — Encounter: Payer: Self-pay | Admitting: Family Medicine

## 2012-08-03 ENCOUNTER — Ambulatory Visit (INDEPENDENT_AMBULATORY_CARE_PROVIDER_SITE_OTHER): Payer: Managed Care, Other (non HMO) | Admitting: Family Medicine

## 2012-08-03 VITALS — BP 140/93 | HR 66 | Temp 97.9°F | Wt 179.3 lb

## 2012-08-03 DIAGNOSIS — M79609 Pain in unspecified limb: Secondary | ICD-10-CM

## 2012-08-03 DIAGNOSIS — M79606 Pain in leg, unspecified: Secondary | ICD-10-CM | POA: Insufficient documentation

## 2012-08-03 DIAGNOSIS — IMO0002 Reserved for concepts with insufficient information to code with codable children: Secondary | ICD-10-CM

## 2012-08-03 DIAGNOSIS — M5410 Radiculopathy, site unspecified: Secondary | ICD-10-CM | POA: Insufficient documentation

## 2012-08-03 DIAGNOSIS — R0789 Other chest pain: Secondary | ICD-10-CM

## 2012-08-03 DIAGNOSIS — M5430 Sciatica, unspecified side: Secondary | ICD-10-CM

## 2012-08-03 NOTE — Assessment & Plan Note (Signed)
Patient's history and physical exam are consistent with radicular pain.   Likely etiology - disc herniation or piriformis syndrome. Gave patient prescription for physical therapy.   Will consider imaging study and further workup (i.e. MRI) if symptoms continue to persist and/or do not improve.

## 2012-08-03 NOTE — Patient Instructions (Addendum)
It was nice seeing you today.  The chest pain you are experiencing is not cardiac in nature.  You can take Tylenol for any associated discomfort.  In regards to your sciatic pain, you may have a herniated disc.  First line treatment is physical therapy and I have given you a prescription for this.  You can chose any PT that you like.  Please follow up with me in 3 months.

## 2012-08-03 NOTE — Assessment & Plan Note (Signed)
Patient's chest pain is not cardiac in nature.  It is very superficial and occurs at surgical scar, not associated with exertion or relieved by rest. Patient urged to take Tylenol PRN for pain.
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
## 2012-08-03 NOTE — Assessment & Plan Note (Signed)
Unclear etiology of patients bilateral ankle pain at this time. Will continue to monitor and will consider further workup as needed. Physical examination of both ankles was benign and does not suggest underlying pathology.

## 2012-11-27 ENCOUNTER — Encounter: Payer: Self-pay | Admitting: Internal Medicine

## 2012-11-27 ENCOUNTER — Ambulatory Visit (INDEPENDENT_AMBULATORY_CARE_PROVIDER_SITE_OTHER): Payer: MEDICARE | Admitting: Internal Medicine

## 2012-11-27 VITALS — BP 120/78 | HR 73 | Ht 66.0 in | Wt 185.8 lb

## 2012-11-27 DIAGNOSIS — Z8679 Personal history of other diseases of the circulatory system: Secondary | ICD-10-CM

## 2012-11-27 DIAGNOSIS — Z95810 Presence of automatic (implantable) cardiac defibrillator: Secondary | ICD-10-CM

## 2012-11-27 DIAGNOSIS — I5032 Chronic diastolic (congestive) heart failure: Secondary | ICD-10-CM

## 2012-11-27 LAB — ICD DEVICE OBSERVATION
BATTERY VOLTAGE: 3.16 V
RV LEAD AMPLITUDE: 13.3 mv
RV LEAD IMPEDENCE ICD: 342 Ohm
TZAT-0002SLOWVT: NEGATIVE
TZAT-0018SLOWVT: NEGATIVE
TZAT-0019FASTVT: 8 V
TZAT-0019SLOWVT: 8 V
TZAT-0020FASTVT: 1.5 ms
TZAT-0020SLOWVT: 1.5 ms
TZON-0003SLOWVT: 360 ms
TZON-0004SLOWVT: 16
TZON-0004VSLOWVT: 32
TZON-0005SLOWVT: 12
TZST-0001FASTVT: 2
TZST-0001FASTVT: 3
TZST-0001FASTVT: 5
TZST-0001SLOWVT: 6
TZST-0002FASTVT: NEGATIVE
TZST-0002FASTVT: NEGATIVE
TZST-0002FASTVT: NEGATIVE
TZST-0002SLOWVT: NEGATIVE
TZST-0002SLOWVT: NEGATIVE
TZST-0002SLOWVT: NEGATIVE
VENTRICULAR PACING ICD: 1 pct
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
## 2012-11-27 NOTE — Patient Instructions (Signed)
Your physician wants you to follow-up in: 1 year with Dr. Ladona Ridgel.  You will receive a reminder letter in the mail two months in advance. If you don't receive a letter, please call our office to schedule the follow-up appointment.  Call Dr. Katrinka Blazing for an appointment.  [phone]

## 2012-11-27 NOTE — Assessment & Plan Note (Signed)
His ICD is working normally. We'll plan to recheck in several months. 

## 2013-01-03 ENCOUNTER — Inpatient Hospital Stay (HOSPITAL_COMMUNITY)
Admission: EM | Admit: 2013-01-03 | Discharge: 2013-01-04 | DRG: 313 | Disposition: A | Discharge status: Home / Self Care | Payer: MEDICARE | Attending: Family Medicine | Admitting: Family Medicine

## 2013-01-03 ENCOUNTER — Emergency Department (HOSPITAL_COMMUNITY): Payer: MEDICARE

## 2013-01-03 ENCOUNTER — Encounter (HOSPITAL_COMMUNITY): Payer: Self-pay | Admitting: Emergency Medicine

## 2013-01-03 DIAGNOSIS — N183 Chronic kidney disease, stage 3 unspecified: Secondary | ICD-10-CM | POA: Diagnosis present

## 2013-01-03 DIAGNOSIS — I5032 Chronic diastolic (congestive) heart failure: Secondary | ICD-10-CM | POA: Diagnosis present

## 2013-01-03 DIAGNOSIS — Z79899 Other long term (current) drug therapy: Secondary | ICD-10-CM

## 2013-01-03 DIAGNOSIS — Z7982 Long term (current) use of aspirin: Secondary | ICD-10-CM

## 2013-01-03 DIAGNOSIS — Z8679 Personal history of other diseases of the circulatory system: Secondary | ICD-10-CM

## 2013-01-03 DIAGNOSIS — Z95810 Presence of automatic (implantable) cardiac defibrillator: Secondary | ICD-10-CM | POA: Diagnosis present

## 2013-01-03 DIAGNOSIS — I509 Heart failure, unspecified: Secondary | ICD-10-CM | POA: Diagnosis present

## 2013-01-03 DIAGNOSIS — R079 Chest pain, unspecified: Secondary | ICD-10-CM

## 2013-01-03 DIAGNOSIS — R0789 Other chest pain: Secondary | ICD-10-CM | POA: Diagnosis present

## 2013-01-03 DIAGNOSIS — Z905 Acquired absence of kidney: Secondary | ICD-10-CM

## 2013-01-03 DIAGNOSIS — G510 Bell's palsy: Secondary | ICD-10-CM | POA: Diagnosis present

## 2013-01-03 DIAGNOSIS — I10 Essential (primary) hypertension: Secondary | ICD-10-CM | POA: Diagnosis present

## 2013-01-03 DIAGNOSIS — D696 Thrombocytopenia, unspecified: Secondary | ICD-10-CM | POA: Diagnosis present

## 2013-01-03 DIAGNOSIS — R42 Dizziness and giddiness: Secondary | ICD-10-CM | POA: Diagnosis present

## 2013-01-03 DIAGNOSIS — I129 Hypertensive chronic kidney disease with stage 1 through stage 4 chronic kidney disease, or unspecified chronic kidney disease: Secondary | ICD-10-CM | POA: Diagnosis present

## 2013-01-03 DIAGNOSIS — E7800 Pure hypercholesterolemia, unspecified: Secondary | ICD-10-CM | POA: Diagnosis present

## 2013-01-03 DIAGNOSIS — E781 Pure hyperglyceridemia: Secondary | ICD-10-CM | POA: Diagnosis present

## 2013-01-03 DIAGNOSIS — Z9889 Other specified postprocedural states: Secondary | ICD-10-CM

## 2013-01-03 LAB — BASIC METABOLIC PANEL
BUN: 16 mg/dL (ref 6–23)
Calcium: 8.9 mg/dL (ref 8.4–10.5)
GFR calc non Af Amer: 57 mL/min — ABNORMAL LOW (ref >90)
Glucose, Bld: 141 mg/dL — ABNORMAL HIGH (ref 70–99)

## 2013-01-03 LAB — CBC
HCT: 49.9 % (ref 39.0–52.0)
Hemoglobin: 18.4 g/dL — ABNORMAL HIGH (ref 13.0–17.0)
MCH: 38.9 pg — ABNORMAL HIGH (ref 26.0–34.0)
MCHC: 36.9 g/dL — ABNORMAL HIGH (ref 30.0–36.0)
RDW: 11.5 % (ref 11.5–15.5)

## 2013-01-03 LAB — POCT I-STAT TROPONIN I: Troponin i, poc: 0.05 ng/mL (ref 0.00–0.08)

## 2013-01-03 LAB — PRO B NATRIURETIC PEPTIDE: Pro B Natriuretic peptide (BNP): 1607 pg/mL — ABNORMAL HIGH (ref 0–125)

## 2013-01-03 MED ORDER — MORPHINE SULFATE 4 MG/ML IJ SOLN
4.0000 mg | Freq: Once | INTRAMUSCULAR | Status: AC
  Administered 2013-01-03: 4 mg via INTRAVENOUS
  Filled 2013-01-03: qty 1

## 2013-01-03 MED ORDER — ASPIRIN 81 MG PO CHEW
324.0000 mg | CHEWABLE_TABLET | Freq: Once | ORAL | Status: AC
  Administered 2013-01-03: 324 mg via ORAL
  Filled 2013-01-03: qty 4

## 2013-01-03 MED ORDER — ONDANSETRON HCL 4 MG/2ML IJ SOLN
4.0000 mg | Freq: Once | INTRAMUSCULAR | Status: AC
  Administered 2013-01-03: 4 mg via INTRAVENOUS
  Filled 2013-01-03: qty 2

## 2013-01-03 NOTE — ED Notes (Signed)
Patient with chest pain and shortness of breath that started around 430 this afternoon, no nausea or vomiting at this time.
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
## 2013-01-04 DIAGNOSIS — D696 Thrombocytopenia, unspecified: Secondary | ICD-10-CM | POA: Diagnosis present

## 2013-01-04 DIAGNOSIS — I059 Rheumatic mitral valve disease, unspecified: Secondary | ICD-10-CM

## 2013-01-04 LAB — COMPREHENSIVE METABOLIC PANEL
ALT: 29 U/L (ref 0–53)
AST: 45 U/L — ABNORMAL HIGH (ref 0–37)
Albumin: 3.5 g/dL (ref 3.5–5.2)
CO2: 23 mEq/L (ref 19–32)
Calcium: 8.9 mg/dL (ref 8.4–10.5)
GFR calc non Af Amer: 58 mL/min — ABNORMAL LOW (ref >90)
Sodium: 136 mEq/L (ref 135–145)

## 2013-01-04 LAB — LIPID PANEL
HDL: 25 mg/dL — ABNORMAL LOW (ref >39)
LDL Cholesterol: UNDETERMINED mg/dL (ref 0–99)
Total CHOL/HDL Ratio: 8.2 RATIO
Triglycerides: 959 mg/dL — ABNORMAL HIGH (ref <150)
VLDL: UNDETERMINED mg/dL (ref 0–40)

## 2013-01-04 LAB — LIPASE, BLOOD: Lipase: 65 U/L — ABNORMAL HIGH (ref 11–59)

## 2013-01-04 LAB — HEMOGLOBIN A1C: Mean Plasma Glucose: 100 mg/dL (ref <117)

## 2013-01-04 LAB — TSH: TSH: 3.676 u[IU]/mL (ref 0.350–4.500)

## 2013-01-04 LAB — D-DIMER, QUANTITATIVE: D-Dimer, Quant: 0.36 ug/mL-FEU (ref 0.00–0.48)

## 2013-01-04 LAB — CBC
HCT: 47.6 % (ref 39.0–52.0)
Hemoglobin: 17.5 g/dL — ABNORMAL HIGH (ref 13.0–17.0)
MCH: 38.5 pg — ABNORMAL HIGH (ref 26.0–34.0)
MCHC: 36.8 g/dL — ABNORMAL HIGH (ref 30.0–36.0)
MCV: 104.8 fL — ABNORMAL HIGH (ref 78.0–100.0)

## 2013-01-04 MED ORDER — ACETAMINOPHEN 650 MG RE SUPP: 650.0000 mg | Freq: Four times a day (QID) | RECTAL | Status: DC | PRN

## 2013-01-04 MED ORDER — ASPIRIN 81 MG PO TABS: 81.0000 mg | ORAL_TABLET | Freq: Every day | ORAL | Status: DC

## 2013-01-04 MED ORDER — ASPIRIN 81 MG PO CHEW
81.0000 mg | CHEWABLE_TABLET | Freq: Every day | ORAL | Status: DC
  Administered 2013-01-04: 81 mg via ORAL
  Filled 2013-01-04: qty 1

## 2013-01-04 MED ORDER — VERAPAMIL HCL ER 240 MG PO TBCR: 240.0000 mg | EXTENDED_RELEASE_TABLET | ORAL | Status: DC

## 2013-01-04 MED ORDER — HYDRALAZINE HCL 20 MG/ML IJ SOLN
5.0000 mg | Freq: Four times a day (QID) | INTRAMUSCULAR | Status: DC | PRN
  Administered 2013-01-04: 5 mg via INTRAVENOUS
  Filled 2013-01-04 (×2): qty 1

## 2013-01-04 MED ORDER — PANTOPRAZOLE SODIUM 40 MG PO TBEC
40.0000 mg | DELAYED_RELEASE_TABLET | Freq: Every day | ORAL | Status: DC
  Administered 2013-01-04: 40 mg via ORAL
  Filled 2013-01-04: qty 1

## 2013-01-04 MED ORDER — SODIUM CHLORIDE 0.9 % IV SOLN: 250.0000 mL | INTRAVENOUS | Status: DC | PRN

## 2013-01-04 MED ORDER — PANTOPRAZOLE SODIUM 40 MG PO TBEC: 40.0000 mg | DELAYED_RELEASE_TABLET | Freq: Every day | ORAL | Status: DC

## 2013-01-04 MED ORDER — ACETAMINOPHEN 325 MG PO TABS
650.0000 mg | ORAL_TABLET | Freq: Four times a day (QID) | ORAL | Status: DC | PRN
  Administered 2013-01-04: 650 mg via ORAL
  Filled 2013-01-04: qty 2

## 2013-01-04 MED ORDER — ENALAPRIL MALEATE 10 MG PO TABS
10.0000 mg | ORAL_TABLET | Freq: Two times a day (BID) | ORAL | Status: DC
  Administered 2013-01-04 (×2): 10 mg via ORAL
  Filled 2013-01-04 (×3): qty 1

## 2013-01-04 MED ORDER — SODIUM CHLORIDE 0.9 % IJ SOLN
3.0000 mL | Freq: Two times a day (BID) | INTRAMUSCULAR | Status: DC
  Administered 2013-01-04 (×2): 3 mL via INTRAVENOUS

## 2013-01-04 MED ORDER — ONDANSETRON HCL 4 MG/2ML IJ SOLN: 4.0000 mg | Freq: Four times a day (QID) | INTRAMUSCULAR | Status: DC | PRN

## 2013-01-04 MED ORDER — MORPHINE SULFATE 2 MG/ML IJ SOLN
1.0000 mg | INTRAMUSCULAR | Status: DC | PRN
  Administered 2013-01-04: 1 mg via INTRAVENOUS
  Filled 2013-01-04 (×2): qty 1

## 2013-01-04 MED ORDER — VERAPAMIL HCL ER 240 MG PO TBCR
240.0000 mg | EXTENDED_RELEASE_TABLET | Freq: Every day | ORAL | Status: DC
  Administered 2013-01-04: 240 mg via ORAL
  Filled 2013-01-04: qty 1

## 2013-01-04 MED ORDER — HYDRALAZINE HCL 20 MG/ML IJ SOLN
10.0000 mg | Freq: Once | INTRAMUSCULAR | Status: AC
  Administered 2013-01-04: 10 mg via INTRAVENOUS

## 2013-01-04 MED ORDER — METOPROLOL SUCCINATE ER 100 MG PO TB24
100.0000 mg | ORAL_TABLET | Freq: Every day | ORAL | Status: DC
  Administered 2013-01-04: 100 mg via ORAL
  Filled 2013-01-04: qty 1

## 2013-01-04 MED ORDER — MORPHINE SULFATE 2 MG/ML IJ SOLN
1.0000 mg | INTRAMUSCULAR | Status: DC | PRN
  Administered 2013-01-04: 1 mg via INTRAVENOUS

## 2013-01-04 MED ORDER — SODIUM CHLORIDE 0.9 % IJ SOLN: 3.0000 mL | INTRAMUSCULAR | Status: DC | PRN

## 2013-01-04 MED ORDER — HEPARIN SODIUM (PORCINE) 5000 UNIT/ML IJ SOLN
5000.0000 [IU] | Freq: Three times a day (TID) | INTRAMUSCULAR | Status: DC
  Administered 2013-01-04: 5000 [IU] via SUBCUTANEOUS
  Filled 2013-01-04 (×4): qty 1

## 2013-01-04 MED ORDER — POLYETHYLENE GLYCOL 3350 17 G PO PACK: 17.0000 g | PACK | Freq: Every day | ORAL | Status: DC | PRN

## 2013-01-04 MED ORDER — MORPHINE SULFATE 4 MG/ML IJ SOLN
4.0000 mg | Freq: Once | INTRAMUSCULAR | Status: AC
  Administered 2013-01-04: 4 mg via INTRAVENOUS
  Filled 2013-01-04: qty 1

## 2013-01-04 MED ORDER — ENALAPRIL MALEATE 10 MG PO TABS
10.0000 mg | ORAL_TABLET | Freq: Two times a day (BID) | ORAL | Status: DC
  Filled 2013-01-04 (×2): qty 1

## 2013-01-04 MED ORDER — ONDANSETRON HCL 4 MG PO TABS: 4.0000 mg | ORAL_TABLET | Freq: Four times a day (QID) | ORAL | Status: DC | PRN

## 2013-01-04 NOTE — Progress Notes (Signed)
  Echocardiogram 2D Echocardiogram has been performed.  Ellender Hose A 01/04/2013, 9:10 AM

## 2013-01-04 NOTE — Progress Notes (Signed)
PGY 1 Update: Pt seen and examined at bedside.  Doing well this AM.  No CP, no SOB, no HA, abdominal pain  Exam:  NAD RRR, + ICD lead in place R side Chest Rib 3/4, +1/6 murmur systolic at apex Abdominal : NABS, Soft, NT/ND Extremities: No edema B/L   A/P After talking with Dr. Katrinka Blazing, his previous cardiologist, who has performed a PCI two times in the past for similar complaints and has not found cardiac origin, do not believe this is cardiac along with his EKG being unchanged, no events on telemetry, troponin - x 3 and echo showing preserved EF and BNP unchanged. This could be related to reflux.  Dr. Katrinka Blazing did not believe his chest pressure was cardiac in origin and this was an unnecessary consult.  Will D/C home with PPI and specific instructions for him to come back or call if his pressure becomes worse or he has n/v or pain down the arm or can't breath.   Twana First Paulina Fusi, DO of [facility] 01/04/2013, 12:26 PM

## 2013-01-04 NOTE — Care Management Note (Signed)
    Page 1 of 1   01/04/2013     8:49:49 AM   CARE MANAGEMENT NOTE 01/04/2013  Patient:  [name] Moreno, [name] Moreno   Account Number:  [id number]  Date Initiated:  01/04/2013  Documentation initiated by:  Junius Creamer  Subjective/Objective Assessment:   adm w ch pressure     Action/Plan:   lives alone, pcp dr Alona Bene cook   Anticipated DC Date:     Anticipated DC Plan:  HOME/SELF CARE      DC Planning Services  CM consult      Choice offered to / List presented to:             Status of service:   Medicare Important Message given?   (If response is "NO", the following Medicare IM given date fields will be blank) Date Medicare IM given:   Date Additional Medicare IM given:    Discharge Disposition:  HOME/SELF CARE  Per UR Regulation:  Reviewed for med. necessity/level of care/duration of stay  If discussed at Long Length of Stay Meetings, dates discussed:    Comments:  2/21 0849 debbie Oree Mirelez rn,bsn

## 2013-01-04 NOTE — Discharge Summary (Signed)
Family Medicine Teaching Service  Discharge Note : Attending Jeff Minta Fair MD Pager [phone] Inpatient Team Pager:  [phone]  I have reviewed this patient and the patient's chart and have discussed discharge planning with the resident at the time of discharge. I agree with the discharge plan as above.    

## 2013-01-04 NOTE — Progress Notes (Signed)
MD with family practice paged and notified of pt's current BP. Ordered to give scheduled dose of  home BP medication. Will continue to monitor.

## 2013-01-04 NOTE — Progress Notes (Signed)
MD with family medicine paged, due to increase in pt BP despite medication administered. Will continue to monitor.
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
## 2013-01-04 NOTE — Progress Notes (Signed)
[facility] paged for consult, and continued increase in BP. Will continue to monitor.

## 2013-01-04 NOTE — Discharge Summary (Signed)
Physician Discharge Summary  Patient ID: [name] Moreno MRN: [id number] DOB: [birth year] Age: 51 y.o.  Admit date: 01/03/2013 Discharge date: 01/04/2013 Admitting Physician: Barbaraann Barthel, MD  PCP: Everlene Other, DO  Consultants:[facility] (Dr. Verdis Prime)      Discharge Diagnosis:  Principal Problem:   Chest pain, non-cardiac Active Problems:   HYPERTRIGLYCERIDEMIA   Essential hypertension, benign   DIASTOLIC HEART FAILURE, CHRONIC   ICD (implantable cardiac defibrillator) in place   Status post myomectomy   S/p nephrectomy   CKD (chronic kidney disease) stage 3, GFR 30-59 ml/min   Thrombocytopenia, unspecified    Hospital Course [name] Moreno is a 52 y.o. year old male with history of significant cardiac disease, polycythemia, stage 3 chronic renal disease s/p left nephrectomy who presented with chest pressure associated with dyspnea for the past several hours.   1) Chest Pain, Non Cardiac, most likely secondary to GERD- Pt presented to the ED with ongoing chest pressure for multiple hours at rest.  He has an extensive PMHx for hypertrophic obstructive cardiomyopathy with resultant myomectomy of the ventricular septum, history of Ventricular Fibrillation w/ implantation of ICD, who was brought into the hospital for an ACS work up and PE w/u.  A D-Dimer on admission was 0.34 and negative so PE was r/o.  An EKG on admission was unchanged from previous without ST elevation, Cardiac markers were cycled and negative x 3, and pt was placed on telemetry without events during his hospital stay.  Pt has been seen by cardiology both here and [place], [place] where he also resides for the part of the year.   He has been seen initially by [facility], Dr. Katrinka Blazing, several years ago for similar complaints and had cardiac catheterization performed that did not show CAD.  Pt also had ICD placed by Dr. Ladona Ridgel by [facility] cards and has been followed occasionally by him.  Dr. Katrinka Blazing was contacted  for re-evaluation and felt this was not cardiac related and stated he had performed catheterization two times in the past and found no evidence of coronary pathology.  He recommended performing an ACS w/u and if troponin markers were negative x 3 and EKG was unchanged that this was not cardiac in origin.  An Echo was also performed showing an EF of 65-70%, mild MR, and no pericardial effusion.  Pt was treated for reflux with protonix and was stable at d/c, and sent home on this medication.   2. Hypertriglyceridemia - Pt had lipid panel as part of his ACS w/u and his triglycerides were elevated to 934.  He has had previous TG around 2,000+ and said this is an isolated problem for him.  He has been on Gemfibrozil for this in the past and is most likely related to his chronic anabolic steroid use which patient states he has been doing this for the last 10+yrs.  A lipase was ordered for possible pancreatitis and was slightly elevated to 65, but this was normal for him.  A Direct LDL was pending at the time of D/C.  3. Polycythemia Vera - Pt Hgb elevated to 18.0 on admission and repeat was 17.4.  Pt states he had not had phlebotomy in the past two months (usually had gotten it one time per month).  Pt could not receive phlebotomy in the hospital due to the service no longer being performed in the hospital.  No Sx at the time of the admission and stable prior to d/c.    4. HTN -  Stable, continued home medications  5. Chronic Diastolic Heart Failure - Pt had Pro-BNP on admission of 1607 (baseline around 1500).  CXR on admission without evidence of fluid overload.  He was continued on his home Toprol XL 100 mg.    6. CKD Stage III - Baseline Creatinine around 1.8, creatinine stable during hospital stay around 1.4.  7. Thrombocytopenia - Pt found to have platelets around 124,000 on admission.  Did not have S/Sx of bleeding or bruising.  Follow up showed platelets at 101,000.  Pt stable at d/c.   8. Vertigo -  Stable, continued home meds        Discharge PE   Filed Vitals:   01/04/13 1200  BP: 127/105  Pulse: 70  Temp:   Resp: 17   General: NAD; comfortably lying in bed NEURO: moves all extremities well without focal deficits; intact strength in all extremities.  Head: [place]/AT  Mouth:  MMM CV: RRR, normal S1/S2, +1/6 SEM at apex  PULM: NI WOB; CTA B/L CHEST: + ICD implantation R side chest ABD: soft, NT, ND, NABS; well-healed vertical midline scar  EXT: no edema; no calf tenderness  SKIN: warm, dry, erythematous B/L digits and clubbing present B/L hands     Procedures/Imaging:  Dg Chest 2 View  01/03/2013    IMPRESSION: Pacemaker leads as described.  Mild cardiac enlargement, stable. No edema or consolidation.   Original Report Authenticated By: Bretta Bang, M.D.    2 D Echo 01/04/13 -  EF 65-70%, Mild MV regurg, no pericardial effusion   Labs  CBC  Recent Labs Lab 01/03/13 2234 01/04/13 0233  WBC 6.3 5.6  HGB 18.4* 17.5*  HCT 49.9 47.6  PLT 124* 101*   BMET  Recent Labs Lab 01/03/13 2234 01/04/13 0233  NA 134* 136  K 4.4 4.3  CL 101 102  CO2 20 23  BUN 16 15  CREATININE 1.40* 1.39*  CALCIUM 8.9 8.9  PROT  --  6.9  BILITOT  --  0.2*  ALKPHOS  --  61  ALT  --  29  AST  --  45*  GLUCOSE 141* 118*   PRO B NATRIURETIC PEPTIDE     Status: Abnormal   Collection Time    01/03/13 10:34 PM      Result Value Range   Pro B Natriuretic peptide (BNP) 1607.0 (*) 0 - 125 pg/mL  TROPONIN I     Status: None   Collection Time    01/04/13  2:27 AM      Result Value Range   Troponin I <0.30  <0.30 ng/mL   Comment:            Due to the release kinetics of cTnI,     a negative result within the first hours     of the onset of symptoms does not rule out     myocardial infarction with certainty.     If myocardial infarction is still suspected,     repeat the test at appropriate intervals.  TSH     Status: None   Collection Time    01/04/13  2:33 AM      Result  Value Range   TSH 3.676  0.350 - 4.500 uIU/mL  HEMOGLOBIN A1C     Status: None   Collection Time    01/04/13  2:33 AM      Result Value Range   Hemoglobin A1C 5.1  <5.7 %   Comment: (NOTE)  According to the ADA Clinical Practice Recommendations for 2011, when     HbA1c is used as a screening test:      >=6.5%   Diagnostic of Diabetes Mellitus               (if abnormal result is confirmed)     5.7-6.4%   Increased risk of developing Diabetes Mellitus     References:Diagnosis and Classification of Diabetes Mellitus,Diabetes     Care,2011,34(Suppl 1):S62-S69 and Standards of Medical Care in             Diabetes - 2011,Diabetes Care,2011,34 (Suppl 1):S11-S61.   Mean Plasma Glucose 100  <117 mg/dL  D-DIMER, QUANTITATIVE     Status: None   Collection Time    01/04/13  2:33 AM      Result Value Range   D-Dimer, Quant 0.36  0.00 - 0.48 ug/mL-FEU   Comment:            AT THE INHOUSE ESTABLISHED CUTOFF     VALUE OF 0.48 ug/mL FEU,     THIS ASSAY HAS BEEN DOCUMENTED     IN THE LITERATURE TO HAVE     A SENSITIVITY AND NEGATIVE     PREDICTIVE VALUE OF AT LEAST     98 TO 99%.  THE TEST RESULT     SHOULD BE CORRELATED WITH     AN ASSESSMENT OF THE CLINICAL     PROBABILITY OF DVT / VTE.  LIPID PANEL     Status: Abnormal   Collection Time    01/04/13  2:33 AM      Result Value Range   Cholesterol 206 (*) 0 - 200 mg/dL   Triglycerides 045 (*) <150 mg/dL   Comment: LIPEMIC SPECIMEN   HDL 25 (*) >39 mg/dL   Total CHOL/HDL Ratio 8.2     VLDL UNABLE TO CALCULATE IF TRIGLYCERIDE OVER 400 mg/dL  0 - 40 mg/dL   LDL Cholesterol UNABLE TO CALCULATE IF TRIGLYCERIDE OVER 400 mg/dL  0 - 99 mg/dL  TROPONIN I     Status: None   Collection Time    01/04/13  8:15 AM      Result Value Range   Troponin I <0.30  <0.30 ng/mL  LIPASE, BLOOD     Status: Abnormal   Collection Time    01/04/13  9:47 AM      Result Value Range    Lipase 65 (*) 11 - 59 U/L       Patient condition at time of discharge/disposition: stable  Disposition-home   Follow up issues: 1. Chest Pressure/Reflux Sx - Rx Protonix, please address if this is helping his Hx 2. Consider putting on Statin (pt states he is on gemfibrozil for his TG) but may need to be on statin for heart and to decrease her TG.  Direct LDL pending  3. Chronic Diastolic CHF - Pt with history of being on Lasix, may need refill vs going to cards [facility], Dr. Katrinka Blazing) for reassessment.  4. Polycythemia Vera - Will need outpatient phlebotomy  5. Thrombocytopenia - W/ PCV, thrombocytosis is more common than thrombocytopenia.  Consider repeating CBCAD and possible Heme consult in outpatient setting.   Discharge follow up:  Follow-up Information   Follow up with Everlene Other, DO. (Friday 2/28 @ 1:15 PM)    Contact information:   [address] [place] [address] [phone]      Discharge Orders   Future Appointments Provider Department Dept Phone  01/11/2013 1:30 PM Tommie Sams, DO [facility] [phone]   02/25/2013 11:25 AM [facility] Main Office [place]) [phone]   Future Orders Complete By Expires     Call MD for:  difficulty breathing, headache or visual disturbances  As directed     Call MD for:  extreme fatigue  As directed     Call MD for:  persistant dizziness or light-headedness  As directed     Call MD for:  persistant nausea and vomiting  As directed         Discharge Instructions: Please refer to Patient Instructions section of EMR for full details.  Patient was counseled important signs and symptoms that should prompt return to medical care, changes in medications, dietary instructions, activity restrictions, and follow up appointments.  Significant instructions noted below:    Discharge Medications   Medication List    TAKE these medications       aspirin 81 MG tablet   Take 81 mg by mouth daily.     enalapril 10 MG tablet  Commonly known as:  VASOTEC  Take 10 mg by mouth 2 (two) times daily.     meclizine 25 MG tablet  Commonly known as:  ANTIVERT  Take 25 mg by mouth 3 (three) times daily as needed. For dizziness     metoprolol succinate 100 MG 24 hr tablet  Commonly known as:  TOPROL-XL  Take 100 mg by mouth daily.     pantoprazole 40 MG tablet  Commonly known as:  PROTONIX  Take 1 tablet (40 mg total) by mouth daily.     verapamil 240 MG CR tablet  Commonly known as:  CALAN-SR  Take 240 mg by mouth every morning.            Gildardo Cranker, DO of [facility] 01/04/2013 1:55 PM

## 2013-01-11 ENCOUNTER — Ambulatory Visit (INDEPENDENT_AMBULATORY_CARE_PROVIDER_SITE_OTHER): Payer: MEDICARE | Admitting: Family Medicine

## 2013-01-11 VITALS — BP 147/87 | HR 77 | Temp 98.5°F | Ht 66.0 in | Wt 185.0 lb

## 2013-01-11 DIAGNOSIS — D696 Thrombocytopenia, unspecified: Secondary | ICD-10-CM

## 2013-01-11 DIAGNOSIS — R0789 Other chest pain: Secondary | ICD-10-CM

## 2013-01-11 DIAGNOSIS — D45 Polycythemia vera: Secondary | ICD-10-CM

## 2013-01-11 DIAGNOSIS — I5032 Chronic diastolic (congestive) heart failure: Secondary | ICD-10-CM

## 2013-01-11 DIAGNOSIS — I10 Essential (primary) hypertension: Secondary | ICD-10-CM

## 2013-01-11 MED ORDER — MECLIZINE HCL 25 MG PO TABS: 25.0000 mg | ORAL_TABLET | Freq: Three times a day (TID) | ORAL | Status: DC | PRN

## 2013-01-11 NOTE — Patient Instructions (Addendum)
It was good seeing you today.  For your high blood pressure, continue taking all of your medications as prescribed.  In regards to you polycythemia and low platelets, I have put in a referral to hematology.  Our office will be in contact.  Please schedule a follow up appointment with your Cardiologist Dr. Ladona Ridgel.  See me back in 6 months or earlier if needed.

## 2013-01-11 NOTE — Assessment & Plan Note (Signed)
Currently stable.  No evidence of exacerbation.  Will continue antihypertensives. No indication for Lasix at this time.  Patient will continue to follow up with Cardiology.

## 2013-01-11 NOTE — Progress Notes (Signed)
Subjective:     Patient ID: [name] Moreno, male   DOB: [birth year], 52 y.o.   MRN: [id number]  HPI CC: Hospital follow up for chest pain  52 year old gentlemen with HTN, Diastolic CHF, and CKD stage III presents for hospital follow up.  Patient was admitted from 2/20 - 2/21 for chest pain.  During admission, EKG was unremarkable and cardiac enzymes were negative x 3. Chest pain was attributed to GERD and he was discharged home on PPI.  1) Chest pain - Currently resolved.  PPI has helped. - No additional episodes of chest pain. - ROS: Denies Chest pain.  Reports occasional SOB with activity.  2) HTN - Currently stable on Verapamil, Toprol XL, and Vasotec - ROS: Denies headache, chest pain.  Reports some lower extremity swelling and dizziness.  3) Diastolic Heart Failure  - Currently stable.  Patient is followed by [facility]. - Currently not on Lasix.  Taking medications as listed above. - ROS: Reports occasional LE swelling.  4) Hypertriglyceridemia - Patient states that he is currently taking medication for this (prescribed by [place] physician). - States that it is not a Fibrate.  Unclear of what this medication is at this time.  5) Polycythemia and Thrombocytopenia - Managed by Hematologist in [place] (patient spends time in both [place] and [place]) - Currently stable. Last CBC revealed Hb 17.5 and Platelets of 101.  PMH, Surgical History, and Social Hx reviewed.  Patient is a non smoker.  Review of Systems See HPI    Objective:   Physical Exam General: well appearing. NAD. Heart: RRR. 2/6 Systolic murmur LLSB.  Lungs: CTAB. Extremities: no edema noted.    Assessment:         Plan:

## 2013-01-11 NOTE — Assessment & Plan Note (Signed)
Currently resolved.  Patient is being followed by cardiology.

## 2013-01-11 NOTE — Assessment & Plan Note (Signed)
Currently stable on 3 antihypertensives.

## 2013-01-11 NOTE — Assessment & Plan Note (Signed)
Currently stable.  Need to obtain medical records from [facility].  Referral to hematology completed today.

## 2013-01-11 NOTE — Assessment & Plan Note (Signed)
Stable.  Hematology referral completed today so that patient can receive his care in [place].

## 2013-01-14 ENCOUNTER — Telehealth: Payer: Self-pay | Admitting: Oncology

## 2013-01-14 NOTE — Telephone Encounter (Signed)
S/W pt in re NP appt 03/26 @ 11 w/Dr. Gaylyn Rong Referring Dr. Paula Compton Dx- Thrombocytopenia Welcome packet mailed.

## 2013-01-15 ENCOUNTER — Telehealth: Payer: Self-pay | Admitting: Oncology

## 2013-01-15 NOTE — Telephone Encounter (Signed)
C/D 01/15/13 for appt. 02/06/13

## 2013-01-28 ENCOUNTER — Other Ambulatory Visit: Payer: Self-pay | Admitting: Oncology

## 2013-01-28 DIAGNOSIS — D696 Thrombocytopenia, unspecified: Secondary | ICD-10-CM

## 2013-01-28 DIAGNOSIS — D45 Polycythemia vera: Secondary | ICD-10-CM

## 2013-02-05 NOTE — Patient Instructions (Addendum)
1.  Issue:  Low platelet count (thrombocytopenia). 2.  This has been a chronic issue.  Most likely, there is low clinical concern for a primary bone marrow failure state given its chronicity.  Potential causes are medication-induced.  3.  Recommendation:  Observation with every 6 months blood check.  In the future, if platelet count significantly decreases, we may consider bone marrow biopsy at that time.  A bone marrow biopsy at this time has low clinical utility.  4.  Polycythemia vera (elevated red blood cell):  Test:  JAK-2 mutation sent today.  Recommend resuming phlebotomy every 3 months to bring down hematocrit to <45% to decrease risk of blood clot. Continue Aspirin to decrease risk of clot.  Consider Hydrea in the future to also decrease risk of clot.

## 2013-02-06 ENCOUNTER — Telehealth: Payer: Self-pay | Admitting: Oncology

## 2013-02-06 ENCOUNTER — Ambulatory Visit: Payer: MEDICARE

## 2013-02-06 ENCOUNTER — Other Ambulatory Visit (HOSPITAL_BASED_OUTPATIENT_CLINIC_OR_DEPARTMENT_OTHER): Payer: MEDICARE | Admitting: Lab

## 2013-02-06 ENCOUNTER — Telehealth: Payer: Self-pay | Admitting: *Deleted

## 2013-02-06 ENCOUNTER — Encounter: Payer: Self-pay | Admitting: Oncology

## 2013-02-06 ENCOUNTER — Ambulatory Visit (HOSPITAL_BASED_OUTPATIENT_CLINIC_OR_DEPARTMENT_OTHER): Payer: MEDICARE | Admitting: Oncology

## 2013-02-06 VITALS — BP 133/86 | HR 73 | Temp 97.6°F | Resp 20 | Ht 66.0 in | Wt 188.8 lb

## 2013-02-06 DIAGNOSIS — D696 Thrombocytopenia, unspecified: Secondary | ICD-10-CM

## 2013-02-06 DIAGNOSIS — D45 Polycythemia vera: Secondary | ICD-10-CM

## 2013-02-06 LAB — CBC WITH DIFFERENTIAL/PLATELET
Basophils Absolute: 0 10*3/uL (ref 0.0–0.1)
EOS%: 1.8 % (ref 0.0–7.0)
HCT: 51.6 % — ABNORMAL HIGH (ref 38.4–49.9)
HGB: 18.4 g/dL — ABNORMAL HIGH (ref 13.0–17.1)
LYMPH%: 20.8 % (ref 14.0–49.0)
MCH: 36.9 pg — ABNORMAL HIGH (ref 27.2–33.4)
MONO#: 0.5 10*3/uL (ref 0.1–0.9)
NEUT%: 68.4 % (ref 39.0–75.0)
Platelets: 100 10*3/uL — ABNORMAL LOW (ref 140–400)
lymph#: 1.2 10*3/uL (ref 0.9–3.3)

## 2013-02-06 NOTE — Telephone Encounter (Signed)
Per staff message and POF I have scheduled appts.  [name] Moreno  

## 2013-02-06 NOTE — Progress Notes (Signed)
Checked in new patient. No financial issues. °
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
## 2013-02-06 NOTE — Telephone Encounter (Signed)
Waiting for Phlebotomy schedule from [facility]

## 2013-02-07 ENCOUNTER — Telehealth: Payer: Self-pay | Admitting: Oncology

## 2013-02-07 NOTE — Telephone Encounter (Signed)
Called pt left message reagrding appt for tomorrow phlebotomy 02/08/13

## 2013-02-08 ENCOUNTER — Ambulatory Visit (HOSPITAL_BASED_OUTPATIENT_CLINIC_OR_DEPARTMENT_OTHER): Payer: MEDICARE

## 2013-02-08 ENCOUNTER — Other Ambulatory Visit: Payer: MEDICARE | Admitting: Lab

## 2013-02-08 DIAGNOSIS — D45 Polycythemia vera: Secondary | ICD-10-CM

## 2013-02-08 NOTE — Patient Instructions (Signed)
Patient aware of next appointment; discharged home with no complaints. 

## 2013-02-11 ENCOUNTER — Encounter: Payer: Self-pay | Admitting: Oncology

## 2013-02-11 ENCOUNTER — Other Ambulatory Visit: Payer: Self-pay | Admitting: Oncology

## 2013-02-12 ENCOUNTER — Encounter: Payer: Self-pay | Admitting: Oncology

## 2013-02-25 ENCOUNTER — Encounter: Payer: Medicare Other | Admitting: *Deleted

## 2013-03-06 ENCOUNTER — Encounter: Payer: Self-pay | Admitting: *Deleted

## 2013-03-13 ENCOUNTER — Ambulatory Visit (INDEPENDENT_AMBULATORY_CARE_PROVIDER_SITE_OTHER): Payer: MEDICARE | Admitting: *Deleted

## 2013-03-13 ENCOUNTER — Other Ambulatory Visit: Payer: Self-pay | Admitting: Internal Medicine

## 2013-03-13 ENCOUNTER — Encounter: Payer: Self-pay | Admitting: Internal Medicine

## 2013-03-13 DIAGNOSIS — Z8679 Personal history of other diseases of the circulatory system: Secondary | ICD-10-CM

## 2013-03-13 DIAGNOSIS — Z95810 Presence of automatic (implantable) cardiac defibrillator: Secondary | ICD-10-CM

## 2013-03-17 LAB — REMOTE ICD DEVICE
DEV-0020ICD: NEGATIVE
FVT: 0
PACEART VT: 0
TOT-0001: 2
TOT-0002: 0
TZAT-0001SLOWVT: 1
TZAT-0012SLOWVT: 200 ms
TZAT-0018FASTVT: NEGATIVE
TZAT-0018SLOWVT: NEGATIVE
TZAT-0019FASTVT: 8 V
TZAT-0020FASTVT: 1.5 ms
TZON-0003SLOWVT: 360 ms
TZON-0003VSLOWVT: 350 ms
TZON-0004SLOWVT: 16
TZST-0001FASTVT: 2
TZST-0001FASTVT: 6
TZST-0001SLOWVT: 2
TZST-0001SLOWVT: 4
TZST-0001SLOWVT: 6
TZST-0002FASTVT: NEGATIVE
TZST-0002FASTVT: NEGATIVE
TZST-0002FASTVT: NEGATIVE
TZST-0002SLOWVT: NEGATIVE
TZST-0002SLOWVT: NEGATIVE

## 2013-04-04 ENCOUNTER — Encounter: Payer: Self-pay | Admitting: *Deleted

## 2013-04-13 ENCOUNTER — Other Ambulatory Visit (HOSPITAL_COMMUNITY): Payer: Self-pay | Admitting: Family Medicine

## 2013-05-10 ENCOUNTER — Other Ambulatory Visit: Payer: Self-pay | Admitting: Oncology

## 2013-05-10 ENCOUNTER — Other Ambulatory Visit (HOSPITAL_BASED_OUTPATIENT_CLINIC_OR_DEPARTMENT_OTHER): Payer: MEDICARE | Admitting: Lab

## 2013-05-10 ENCOUNTER — Ambulatory Visit (HOSPITAL_BASED_OUTPATIENT_CLINIC_OR_DEPARTMENT_OTHER): Payer: MEDICARE

## 2013-05-10 ENCOUNTER — Other Ambulatory Visit: Payer: Self-pay | Admitting: *Deleted

## 2013-05-10 VITALS — BP 122/80 | HR 64 | Temp 97.0°F

## 2013-05-10 DIAGNOSIS — D45 Polycythemia vera: Secondary | ICD-10-CM

## 2013-05-10 LAB — CBC WITH DIFFERENTIAL/PLATELET
Basophils Absolute: 0 10*3/uL (ref 0.0–0.1)
Eosinophils Absolute: 0.1 10*3/uL (ref 0.0–0.5)
HGB: 17.4 g/dL — ABNORMAL HIGH (ref 13.0–17.1)
LYMPH%: 29.7 % (ref 14.0–49.0)
MCV: 98.3 fL — ABNORMAL HIGH (ref 79.3–98.0)
MONO%: 7.7 % (ref 0.0–14.0)
NEUT#: 2.5 10*3/uL (ref 1.5–6.5)
NEUT%: 60.7 % (ref 39.0–75.0)
Platelets: 76 10*3/uL — ABNORMAL LOW (ref 140–400)

## 2013-05-10 NOTE — Patient Instructions (Addendum)
Therapeutic Phlebotomy Therapeutic phlebotomy is the controlled removal of blood from your body for the purpose of treating a medical condition. It is similar to donating blood. Usually, about a pint (470 mL) of blood is removed. The average adult has 9 to 12 pints (4.3 to 5.7 L) of blood. Therapeutic phlebotomy may be used to treat the following medical conditions:  Hemochromatosis. This is a condition in which there is too much iron in the blood.  Polycythemia vera. This is a condition in which there are too many red cells in the blood.  Porphyria cutanea tarda. This is a disease usually passed from one generation to the next (inherited). It is a condition in which an important part of hemoglobin is not made properly. This results in the build up of abnormal amounts of porphyrins in the body.  Sickle cell disease. This is an inherited disease. It is a condition in which the red blood cells form an abnormal crescent shape rather than a round shape. LET YOUR CAREGIVER KNOW ABOUT:  Allergies.  Medicines taken including herbs, eyedrops, over-the-counter medicines, and creams.  Use of steroids (by mouth or creams).  Previous problems with anesthetics or numbing medicine.  History of blood clots.  History of bleeding or blood problems.  Previous surgery.  Possibility of pregnancy, if this applies. RISKS AND COMPLICATIONS This is a simple and safe procedure. Problems are unlikely. However, problems can occur and may include:  Nausea or lightheadedness.  Low blood pressure.  Soreness, bleeding, swelling, or bruising at the needle insertion site.  Infection. BEFORE THE PROCEDURE  This is a procedure that can be done as an outpatient. Confirm the time that you need to arrive for your procedure. Confirm whether there is a need to fast or withhold any medications. It is helpful to wear clothing with sleeves that can be raised above the elbow. A blood sample may be done to determine the  amount of red blood cells or iron in your blood. Plan ahead of time to have someone drive you home after the procedure. PROCEDURE The entire procedure from preparation through recovery takes about 1 hour. The actual collection takes about 10 to 15 minutes.  A needle will be inserted into your vein.  Tubing and a collection bag will be attached to that needle.  Blood will flow through the needle and tubing into the collection bag.  You may be asked to open and close your hand slowly and continuously during the entire collection.  Once the specified amount of blood has been removed from your body, the collection bag and tubing will be clamped.  The needle will be removed.  Pressure will be held on the site of the needle insertion to stop the bleeding. Then a bandage will be placed over the needle insertion site. AFTER THE PROCEDURE  Your recovery will be assessed and monitored. If there are no problems, as an outpatient, you should be able to go home shortly after the procedure.  Document Released: 04/04/2011 Document Revised: 01/23/2012 Document Reviewed: 04/04/2011 ExitCare Patient Information 2014 [employer].  

## 2013-05-10 NOTE — Progress Notes (Signed)
525 grams removed removed using a 16 gauge phlebotomy set.  Patient tolerated well.  Nourishment provided.  [name]  05/10/2013  12:19 PM

## 2013-05-10 NOTE — Progress Notes (Signed)
1242 Patient discharged after 30 minutes post phlebotomy. Patient denies any problems.

## 2013-05-26 ENCOUNTER — Emergency Department (HOSPITAL_COMMUNITY)
Admission: EM | Admit: 2013-05-26 | Discharge: 2013-05-27 | Disposition: A | Discharge status: Home / Self Care | Payer: MEDICARE | Attending: Emergency Medicine | Admitting: Emergency Medicine

## 2013-05-26 ENCOUNTER — Encounter (HOSPITAL_COMMUNITY): Payer: Self-pay | Admitting: Emergency Medicine

## 2013-05-26 ENCOUNTER — Emergency Department (HOSPITAL_COMMUNITY): Payer: MEDICARE

## 2013-05-26 DIAGNOSIS — I422 Other hypertrophic cardiomyopathy: Secondary | ICD-10-CM | POA: Insufficient documentation

## 2013-05-26 DIAGNOSIS — R61 Generalized hyperhidrosis: Secondary | ICD-10-CM | POA: Insufficient documentation

## 2013-05-26 DIAGNOSIS — R071 Chest pain on breathing: Secondary | ICD-10-CM | POA: Insufficient documentation

## 2013-05-26 DIAGNOSIS — I472 Ventricular tachycardia, unspecified: Secondary | ICD-10-CM | POA: Insufficient documentation

## 2013-05-26 DIAGNOSIS — Z862 Personal history of diseases of the blood and blood-forming organs and certain disorders involving the immune mechanism: Secondary | ICD-10-CM | POA: Insufficient documentation

## 2013-05-26 DIAGNOSIS — Z87448 Personal history of other diseases of urinary system: Secondary | ICD-10-CM | POA: Insufficient documentation

## 2013-05-26 DIAGNOSIS — Z7982 Long term (current) use of aspirin: Secondary | ICD-10-CM | POA: Insufficient documentation

## 2013-05-26 DIAGNOSIS — Z880 Allergy status to penicillin: Secondary | ICD-10-CM | POA: Insufficient documentation

## 2013-05-26 DIAGNOSIS — R0602 Shortness of breath: Secondary | ICD-10-CM | POA: Insufficient documentation

## 2013-05-26 DIAGNOSIS — R079 Chest pain, unspecified: Secondary | ICD-10-CM

## 2013-05-26 DIAGNOSIS — Z79899 Other long term (current) drug therapy: Secondary | ICD-10-CM | POA: Insufficient documentation

## 2013-05-26 DIAGNOSIS — I4729 Other ventricular tachycardia: Secondary | ICD-10-CM | POA: Insufficient documentation

## 2013-05-26 DIAGNOSIS — I509 Heart failure, unspecified: Secondary | ICD-10-CM | POA: Insufficient documentation

## 2013-05-26 DIAGNOSIS — I2510 Atherosclerotic heart disease of native coronary artery without angina pectoris: Secondary | ICD-10-CM | POA: Insufficient documentation

## 2013-05-26 DIAGNOSIS — Z8639 Personal history of other endocrine, nutritional and metabolic disease: Secondary | ICD-10-CM | POA: Insufficient documentation

## 2013-05-26 DIAGNOSIS — I252 Old myocardial infarction: Secondary | ICD-10-CM | POA: Insufficient documentation

## 2013-05-26 LAB — BASIC METABOLIC PANEL
Calcium: 9.5 mg/dL (ref 8.4–10.5)
Creatinine, Ser: 1.5 mg/dL — ABNORMAL HIGH (ref 0.50–1.35)
GFR calc Af Amer: 61 mL/min — ABNORMAL LOW (ref >90)

## 2013-05-26 LAB — CBC
MCH: 37.2 pg — ABNORMAL HIGH (ref 26.0–34.0)
MCV: 98.3 fL (ref 78.0–100.0)
Platelets: 103 10*3/uL — ABNORMAL LOW (ref 150–400)
RDW: 13.7 % (ref 11.5–15.5)

## 2013-05-26 LAB — POCT I-STAT TROPONIN I: Troponin i, poc: 0.06 ng/mL (ref 0.00–0.08)

## 2013-05-26 LAB — PRO B NATRIURETIC PEPTIDE: Pro B Natriuretic peptide (BNP): 514.6 pg/mL — ABNORMAL HIGH (ref 0–125)

## 2013-05-26 MED ORDER — HYDROCODONE-ACETAMINOPHEN 5-325 MG PO TABS: 1.0000 | ORAL_TABLET | ORAL | Status: DC | PRN

## 2013-05-26 MED ORDER — MORPHINE SULFATE 4 MG/ML IJ SOLN
4.0000 mg | Freq: Once | INTRAMUSCULAR | Status: AC
  Administered 2013-05-26: 4 mg via INTRAVENOUS
  Filled 2013-05-26: qty 1

## 2013-05-26 MED ORDER — KETOROLAC TROMETHAMINE 30 MG/ML IJ SOLN
30.0000 mg | Freq: Once | INTRAMUSCULAR | Status: AC
  Administered 2013-05-26: 30 mg via INTRAVENOUS
  Filled 2013-05-26: qty 1

## 2013-05-26 NOTE — ED Notes (Signed)
Patient transported to X-ray 

## 2013-05-26 NOTE — ED Notes (Signed)
PT. REPORTS LEFT CHEST PAIN ONSET ONSET THIS MORNING WITH SLIGHT SOB AND DIAPHORESIS , PT . STATED HE HAS A DEFIBRILLATOR , HIS CARDIOLOGIST IS DR. Ladona Ridgel  AND DR. Katrinka Blazing .

## 2013-05-26 NOTE — Discharge Instructions (Signed)
Chest Pain (Nonspecific) °It is often hard to give a specific diagnosis for the cause of chest pain. There is always a chance that your pain could be related to something serious, such as a heart attack or a blood clot in the lungs. You need to follow up with your caregiver for further evaluation. °CAUSES  °· Heartburn. °· Pneumonia or bronchitis. °· Anxiety or stress. °· Inflammation around your heart (pericarditis) or lung (pleuritis or pleurisy). °· A blood clot in the lung. °· A collapsed lung (pneumothorax). It can develop suddenly on its own (spontaneous pneumothorax) or from injury (trauma) to the chest. °· Shingles infection (herpes zoster virus). °The chest wall is composed of bones, muscles, and cartilage. Any of these can be the source of the pain. °· The bones can be bruised by injury. °· The muscles or cartilage can be strained by coughing or overwork. °· The cartilage can be affected by inflammation and become sore (costochondritis). °DIAGNOSIS  °Lab tests or other studies, such as X-rays, electrocardiography, stress testing, or cardiac imaging, may be needed to find the cause of your pain.  °TREATMENT  °· Treatment depends on what may be causing your chest pain. Treatment may include: °· Acid blockers for heartburn. °· Anti-inflammatory medicine. °· Pain medicine for inflammatory conditions. °· Antibiotics if an infection is present. °· You may be advised to change lifestyle habits. This includes stopping smoking and avoiding alcohol, caffeine, and chocolate. °· You may be advised to keep your head raised (elevated) when sleeping. This reduces the chance of acid going backward from your stomach into your esophagus. °· Most of the time, nonspecific chest pain will improve within 2 to 3 days with rest and mild pain medicine. °HOME CARE INSTRUCTIONS  °· If antibiotics were prescribed, take your antibiotics as directed. Finish them even if you start to feel better. °· For the next few days, avoid physical  activities that bring on chest pain. Continue physical activities as directed. °· Do not smoke. °· Avoid drinking alcohol. °· Only take over-the-counter or prescription medicine for pain, discomfort, or fever as directed by your caregiver. °· Follow your caregiver's suggestions for further testing if your chest pain does not go away. °· Keep any follow-up appointments you made. If you do not go to an appointment, you could develop lasting (chronic) problems with pain. If there is any problem keeping an appointment, you must call to reschedule. °SEEK MEDICAL CARE IF:  °· You think you are having problems from the medicine you are taking. Read your medicine instructions carefully. °· Your chest pain does not go away, even after treatment. °· You develop a rash with blisters on your chest. °SEEK IMMEDIATE MEDICAL CARE IF:  °· You have increased chest pain or pain that spreads to your arm, neck, jaw, back, or abdomen. °· You develop shortness of breath, an increasing cough, or you are coughing up blood. °· You have severe back or abdominal pain, feel nauseous, or vomit. °· You develop severe weakness, fainting, or chills. °· You have a fever. °THIS IS AN EMERGENCY. Do not wait to see if the pain will go away. Get medical help at once. Call your local emergency services (911 in [place] Do not drive yourself to the hospital. °MAKE SURE YOU:  °· Understand these instructions. °· Will watch your condition. °· Will get help right away if you are not doing well or get worse. °Document Released: 08/10/2005 Document Revised: 01/23/2012 Document Reviewed: 06/05/2008 °ExitCare® Patient Information ©2014 [employer]   [employer]. ° °

## 2013-05-27 MED ORDER — POTASSIUM CHLORIDE CRYS ER 20 MEQ PO TBCR: 20.0000 meq | EXTENDED_RELEASE_TABLET | Freq: Two times a day (BID) | ORAL | Status: DC

## 2013-05-27 MED ORDER — FUROSEMIDE 40 MG PO TABS: 40.0000 mg | ORAL_TABLET | Freq: Every day | ORAL | Status: DC
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
## 2013-06-10 ENCOUNTER — Encounter: Payer: MEDICARE | Admitting: *Deleted

## 2013-06-14 DIAGNOSIS — I82409 Acute embolism and thrombosis of unspecified deep veins of unspecified lower extremity: Secondary | ICD-10-CM

## 2013-06-14 HISTORY — DX: Acute embolism and thrombosis of unspecified deep veins of unspecified lower extremity: I82.409

## 2013-06-18 ENCOUNTER — Encounter: Payer: Self-pay | Admitting: *Deleted

## 2013-06-21 ENCOUNTER — Ambulatory Visit (INDEPENDENT_AMBULATORY_CARE_PROVIDER_SITE_OTHER): Payer: MEDICARE | Admitting: *Deleted

## 2013-06-21 DIAGNOSIS — Z8679 Personal history of other diseases of the circulatory system: Secondary | ICD-10-CM

## 2013-06-21 DIAGNOSIS — I5032 Chronic diastolic (congestive) heart failure: Secondary | ICD-10-CM

## 2013-06-21 DIAGNOSIS — Z95810 Presence of automatic (implantable) cardiac defibrillator: Secondary | ICD-10-CM

## 2013-06-22 ENCOUNTER — Encounter: Payer: Self-pay | Admitting: Internal Medicine

## 2013-07-01 LAB — REMOTE ICD DEVICE
BATTERY VOLTAGE: 3.1428 v
BRDY-0002RV: 40 {beats}/min
CHARGE TIME: 8.818 s
DEV-0020ICD: NEGATIVE
FVT: 0
PACEART VT: 0
RV LEAD AMPLITUDE: 19.8 mv
RV LEAD IMPEDENCE ICD: 380 Ohm
RV LEAD THRESHOLD: 1.125 v
TOT-0001: 2
TOT-0002: 0
TOT-0006: 20120711000000
TZAT-0001FASTVT: 1
TZAT-0001SLOWVT: 1
TZAT-0002FASTVT: NEGATIVE
TZAT-0002SLOWVT: NEGATIVE
TZAT-0012FASTVT: 200 ms
TZAT-0012SLOWVT: 200 ms
TZAT-0018FASTVT: NEGATIVE
TZAT-0018SLOWVT: NEGATIVE
TZAT-0019FASTVT: 8 v
TZAT-0019SLOWVT: 8 v
TZAT-0020FASTVT: 1.5 ms
TZAT-0020SLOWVT: 1.5 ms
TZON-0003SLOWVT: 360 ms
TZON-0003VSLOWVT: 350 ms
TZON-0004SLOWVT: 16
TZON-0004VSLOWVT: 32
TZON-0005SLOWVT: 12
TZST-0001FASTVT: 2
TZST-0001FASTVT: 3
TZST-0001FASTVT: 4
TZST-0001FASTVT: 5
TZST-0001FASTVT: 6
TZST-0001SLOWVT: 2
TZST-0001SLOWVT: 3
TZST-0001SLOWVT: 4
TZST-0001SLOWVT: 5
TZST-0001SLOWVT: 6
TZST-0002FASTVT: NEGATIVE
TZST-0002FASTVT: NEGATIVE
TZST-0002FASTVT: NEGATIVE
TZST-0002FASTVT: NEGATIVE
TZST-0002FASTVT: NEGATIVE
TZST-0002SLOWVT: NEGATIVE
TZST-0002SLOWVT: NEGATIVE
TZST-0002SLOWVT: NEGATIVE
TZST-0002SLOWVT: NEGATIVE
TZST-0002SLOWVT: NEGATIVE
VENTRICULAR PACING ICD: 0.01 pct
VF: 0

## 2013-07-05 ENCOUNTER — Encounter: Payer: Self-pay | Admitting: *Deleted

## 2013-07-06 ENCOUNTER — Encounter (HOSPITAL_COMMUNITY): Payer: Self-pay | Admitting: Emergency Medicine

## 2013-07-06 ENCOUNTER — Emergency Department (HOSPITAL_COMMUNITY)
Admission: EM | Admit: 2013-07-06 | Discharge: 2013-07-06 | Disposition: A | Discharge status: Home / Self Care | Payer: MEDICARE | Attending: Emergency Medicine | Admitting: Emergency Medicine

## 2013-07-06 DIAGNOSIS — I252 Old myocardial infarction: Secondary | ICD-10-CM | POA: Insufficient documentation

## 2013-07-06 DIAGNOSIS — Z862 Personal history of diseases of the blood and blood-forming organs and certain disorders involving the immune mechanism: Secondary | ICD-10-CM | POA: Insufficient documentation

## 2013-07-06 DIAGNOSIS — I2510 Atherosclerotic heart disease of native coronary artery without angina pectoris: Secondary | ICD-10-CM | POA: Insufficient documentation

## 2013-07-06 DIAGNOSIS — Z8639 Personal history of other endocrine, nutritional and metabolic disease: Secondary | ICD-10-CM | POA: Insufficient documentation

## 2013-07-06 DIAGNOSIS — Z95810 Presence of automatic (implantable) cardiac defibrillator: Secondary | ICD-10-CM | POA: Insufficient documentation

## 2013-07-06 DIAGNOSIS — Z7982 Long term (current) use of aspirin: Secondary | ICD-10-CM | POA: Insufficient documentation

## 2013-07-06 DIAGNOSIS — R209 Unspecified disturbances of skin sensation: Secondary | ICD-10-CM | POA: Insufficient documentation

## 2013-07-06 DIAGNOSIS — Z8679 Personal history of other diseases of the circulatory system: Secondary | ICD-10-CM | POA: Insufficient documentation

## 2013-07-06 DIAGNOSIS — I5030 Unspecified diastolic (congestive) heart failure: Secondary | ICD-10-CM | POA: Insufficient documentation

## 2013-07-06 DIAGNOSIS — M79609 Pain in unspecified limb: Secondary | ICD-10-CM | POA: Insufficient documentation

## 2013-07-06 DIAGNOSIS — N189 Chronic kidney disease, unspecified: Secondary | ICD-10-CM | POA: Insufficient documentation

## 2013-07-06 DIAGNOSIS — Z79899 Other long term (current) drug therapy: Secondary | ICD-10-CM | POA: Insufficient documentation

## 2013-07-06 DIAGNOSIS — M79604 Pain in right leg: Secondary | ICD-10-CM

## 2013-07-06 MED ORDER — ENOXAPARIN SODIUM 100 MG/ML ~~LOC~~ SOLN
90.0000 mg | Freq: Two times a day (BID) | SUBCUTANEOUS | Status: DC
  Administered 2013-07-06: 90 mg via SUBCUTANEOUS
  Filled 2013-07-06 (×2): qty 1

## 2013-07-06 NOTE — ED Provider Notes (Signed)
Medical screening examination/treatment/procedure(s) were performed by non-physician practitioner and as supervising physician I was immediately available for consultation/collaboration.  Emmert Roethler R. Karyl Sharrar, MD 07/06/13 2355 

## 2013-07-06 NOTE — ED Notes (Signed)
Pt c/o right lower leg pain ongoing for a couple of weeks. Pt reports pain increases after showering and when first waking up in the morning. Pt describes pain as burning. Pt denies swelling.
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
## 2013-07-06 NOTE — Progress Notes (Signed)
ANTICOAGULATION CONSULT NOTE - Initial Consult  Pharmacy Consult for Lovenox Indication: Possible DVT  Allergies  Allergen Reactions  . Penicillins Hives and Shortness Of Breath  . Shellfish Allergy Shortness Of Breath  . Iodine Hives  . Iohexol      Desc: hives,throat,lip swelling kdean   . Vitamin K And Related Hives    Patient Measurements: Height: 5\' 6"  (167.6 cm) Weight: 192 lb (87.091 kg) IBW/kg (Calculated) : 63.8   Vital Signs: Temp: 98 F (36.7 C) (08/23 1854) Temp src: Oral (08/23 1854) BP: 135/81 mmHg (08/23 1854) Pulse Rate: 67 (08/23 1854)  Labs: No results found for this basename: HGB, HCT, PLT, APTT, LABPROT, INR, HEPARINUNFRC, CREATININE, CKTOTAL, CKMB, TROPONINI,  in the last 72 hours  Estimated Creatinine Clearance: 60.2 ml/min (by C-G formula based on Cr of 1.5).   Medical History: Past Medical History  Diagnosis Date  . Chest pain     angina secondary to hypertrophic cardiomyopathy  . Diastolic congestive heart failure   . Hypertriglyceridemia   . Chronic renal insufficiency   . Ventricular tachycardia     hx  . Hypertrophic cardiomyopathy   . Coronary artery disease   . Myocardial infarction   . Polycythemia     JAK-2 negative on 02/08/2013; but still concern for PV due to lack of obvious cause for secondary polycythemia.     Assessment: 63 YOM admitted with right lower leg pain which has been ongoing for a few weeks. To receive Lovenox and then come back tomorrow for venous duplex. Patient's most recent SCr is 1.5 and with a recorded weight from tonight of 87kg, his estimated CrCl is 70mL/min.  Goal of Therapy:  Anti-Xa level 0.6-1.2 units/ml 4hrs after LMWH dose given Monitor platelets by anticoagulation protocol: Yes   Plan:  1. Start Lovenox 90mg  subq Q12h 2. Follow up venous duplex, and renal function if patient to continue  Tamkia Temples D. Derrien Anschutz, PharmD Clinical Pharmacist Pager: [phone] 07/06/2013 8:21 PM

## 2013-07-07 ENCOUNTER — Encounter (HOSPITAL_COMMUNITY): Payer: Self-pay

## 2013-07-07 ENCOUNTER — Emergency Department (HOSPITAL_COMMUNITY)
Admission: EM | Admit: 2013-07-07 | Discharge: 2013-07-07 | Disposition: A | Discharge status: Home / Self Care | Payer: MEDICARE | Attending: Emergency Medicine | Admitting: Emergency Medicine

## 2013-07-07 ENCOUNTER — Ambulatory Visit (HOSPITAL_COMMUNITY)
Admission: RE | Admit: 2013-07-07 | Discharge: 2013-07-07 | Disposition: A | Discharge status: Home / Self Care | Payer: MEDICARE | Source: Ambulatory Visit | Attending: Emergency Medicine | Admitting: Emergency Medicine

## 2013-07-07 DIAGNOSIS — Z8639 Personal history of other endocrine, nutritional and metabolic disease: Secondary | ICD-10-CM | POA: Insufficient documentation

## 2013-07-07 DIAGNOSIS — M7989 Other specified soft tissue disorders: Secondary | ICD-10-CM

## 2013-07-07 DIAGNOSIS — I2510 Atherosclerotic heart disease of native coronary artery without angina pectoris: Secondary | ICD-10-CM | POA: Insufficient documentation

## 2013-07-07 DIAGNOSIS — I82409 Acute embolism and thrombosis of unspecified deep veins of unspecified lower extremity: Secondary | ICD-10-CM | POA: Insufficient documentation

## 2013-07-07 DIAGNOSIS — Z880 Allergy status to penicillin: Secondary | ICD-10-CM | POA: Insufficient documentation

## 2013-07-07 DIAGNOSIS — M79609 Pain in unspecified limb: Secondary | ICD-10-CM | POA: Insufficient documentation

## 2013-07-07 DIAGNOSIS — N189 Chronic kidney disease, unspecified: Secondary | ICD-10-CM | POA: Insufficient documentation

## 2013-07-07 DIAGNOSIS — I5030 Unspecified diastolic (congestive) heart failure: Secondary | ICD-10-CM | POA: Insufficient documentation

## 2013-07-07 DIAGNOSIS — Z95810 Presence of automatic (implantable) cardiac defibrillator: Secondary | ICD-10-CM | POA: Insufficient documentation

## 2013-07-07 DIAGNOSIS — I252 Old myocardial infarction: Secondary | ICD-10-CM | POA: Insufficient documentation

## 2013-07-07 DIAGNOSIS — I82401 Acute embolism and thrombosis of unspecified deep veins of right lower extremity: Secondary | ICD-10-CM

## 2013-07-07 DIAGNOSIS — Z8679 Personal history of other diseases of the circulatory system: Secondary | ICD-10-CM | POA: Insufficient documentation

## 2013-07-07 DIAGNOSIS — Z862 Personal history of diseases of the blood and blood-forming organs and certain disorders involving the immune mechanism: Secondary | ICD-10-CM | POA: Insufficient documentation

## 2013-07-07 DIAGNOSIS — M5410 Radiculopathy, site unspecified: Secondary | ICD-10-CM

## 2013-07-07 DIAGNOSIS — Z79899 Other long term (current) drug therapy: Secondary | ICD-10-CM | POA: Insufficient documentation

## 2013-07-07 DIAGNOSIS — Z7982 Long term (current) use of aspirin: Secondary | ICD-10-CM | POA: Insufficient documentation

## 2013-07-07 LAB — BASIC METABOLIC PANEL
BUN: 13 mg/dL (ref 6–23)
CO2: 27 mEq/L (ref 19–32)
Chloride: 101 mEq/L (ref 96–112)
Glucose, Bld: 94 mg/dL (ref 70–99)
Potassium: 4.8 mEq/L (ref 3.5–5.1)

## 2013-07-07 LAB — CBC
HCT: 48.2 % (ref 39.0–52.0)
Hemoglobin: 17.9 g/dL — ABNORMAL HIGH (ref 13.0–17.0)
RBC: 4.87 MIL/uL (ref 4.22–5.81)
WBC: 5.3 10*3/uL (ref 4.0–10.5)

## 2013-07-07 MED ORDER — RIVAROXABAN 15 MG PO TABS
15.0000 mg | ORAL_TABLET | Freq: Once | ORAL | Status: AC
  Administered 2013-07-07: 15 mg via ORAL
  Filled 2013-07-07: qty 1

## 2013-07-07 MED ORDER — RIVAROXABAN 15 MG PO TABS: 15.0000 mg | ORAL_TABLET | Freq: Two times a day (BID) | ORAL | Status: DC
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
## 2013-07-07 NOTE — ED Notes (Signed)
Pt seen last night in the ED and told to return today for vascular study of R leg to r/o DVT.  Pt presents today with c/o R leg DVT.

## 2013-07-07 NOTE — ED Notes (Signed)
Notified pharmacy of need for Xarelto.

## 2013-07-07 NOTE — Progress Notes (Signed)
VASCULAR LAB PRELIMINARY  PRELIMINARY  PRELIMINARY  PRELIMINARY  Right lower extremity venous Doppler completed.    Preliminary report:  There is acute DVT noted in the right peroneal vein.  There is sluggish flow noted in the right popliteal vein.  All other veins appear thrombus free.  Marvelous Bouwens, RVT 07/07/2013, 8:51 AM

## 2013-07-08 ENCOUNTER — Ambulatory Visit: Payer: BC Managed Care – PPO | Admitting: Family Medicine

## 2013-07-09 ENCOUNTER — Ambulatory Visit (INDEPENDENT_AMBULATORY_CARE_PROVIDER_SITE_OTHER): Payer: MEDICARE | Admitting: Family Medicine

## 2013-07-09 ENCOUNTER — Encounter: Payer: Self-pay | Admitting: Family Medicine

## 2013-07-09 VITALS — BP 135/92 | HR 73 | Temp 98.2°F | Ht 66.0 in | Wt 183.0 lb

## 2013-07-09 DIAGNOSIS — I82401 Acute embolism and thrombosis of unspecified deep veins of right lower extremity: Secondary | ICD-10-CM

## 2013-07-09 DIAGNOSIS — I82409 Acute embolism and thrombosis of unspecified deep veins of unspecified lower extremity: Secondary | ICD-10-CM

## 2013-07-09 MED ORDER — APIXABAN 5 MG PO TABS: 5.0000 mg | ORAL_TABLET | Freq: Two times a day (BID) | ORAL | Status: DC

## 2013-07-09 MED ORDER — APIXABAN 5 MG PO TABS: ORAL_TABLET | ORAL | Status: DC

## 2013-07-09 NOTE — Progress Notes (Addendum)
  Subjective:    Patient ID: [name] Moreno, male    DOB: [birth year], 52 y.o.   MRN: [id number]  HPI 52 year old male with past medical history of polycythemia vera, CK stage III, status post nephrectomy presents for hospital followup. He was seen and evaluated in the emergency room 2 days ago for right calf pain, patient was found to have right lower extremity DVT, given history of CKD III patient was a poor candidate for Lovenox injections, he was started on Xarelto 15 mg twice a day. Patient states that he has not tolerated this medication well, after taking he has some anxiety with associated lightheadedness and shortness of breath, this resolves after approximately 30-45 minutes, patient denies shortness of breath at other times, denies pleurisy, denies chest pain, denies palpitations, patient states that he no longer wants to take Xarelto  He is seen by hematologist in [place] for his history of polycythemia vera, he has regular scheduled phlebotomy to decrease his blood cell counts, he also follows with a cardiologist as he has an ICD with history of diastolic heart failure  Social: He has been making frequent car trips to [place] as his father is ill.   Review of Systems  Constitutional: Negative for fever, chills and fatigue.  Respiratory: Negative for shortness of breath and wheezing.   Cardiovascular: Positive for leg swelling. Negative for chest pain and palpitations.       Objective:   Physical Exam Vitals: Reviewed General: Pleasant male in no acute distress Cardiac: Regular rate and rhythm, S1 and S2 present, no murmurs, no heaves or thrills Respiratory: Clear to auscultation bilaterally, normal effort Extremities: Tenderness over the lateral aspect of the right lower extremity, minimal calf tenderness, mild pain with Homans testing Skin: Rash, no palpable cords, no overlying calf erythema       Assessment & Plan:  Please see problem specific assessment and plan.

## 2013-07-09 NOTE — Assessment & Plan Note (Addendum)
Patient was seen and evaluated for hospital followup after diagnosis of DVT, patient is not tolerating Xarelto due to increased anxiety after taking the medication, patient is refusing to continue current regimen, he would like to start another blood thinner if possible, his medical history is complicated by history of CAD 3 and polycythemia vera. His recent DVT is likely result of his polycythemia vera and recent travel. -Patient we transitioned to Eliquis 10 mg by mouth twice a day for the next week, he will then decrease his dose to 5 mg twice a day (30 day supply sent to local pharmacy, 90 day supply prescription printed and provided to patient to mail).  -Patient was counseled that he is at increased risk of bleeding with subsequent use of aspirin 81 mg daily, patient is aware of this risk -Patient was counseled to follow up with both his hematologist and cardiologist in the next few weeks

## 2013-07-09 NOTE — Patient Instructions (Addendum)
Stop Xarelto.  Start Eliquis 10 mg twice daily for one week then decrease dose to 5mg  twice daily.   Make follow up appointment with your Hematologist and Cardiologist.

## 2013-07-10 ENCOUNTER — Telehealth: Payer: Self-pay | Admitting: *Deleted

## 2013-07-10 ENCOUNTER — Encounter (HOSPITAL_COMMUNITY): Payer: Self-pay | Admitting: *Deleted

## 2013-07-10 ENCOUNTER — Emergency Department (HOSPITAL_COMMUNITY)
Admission: EM | Admit: 2013-07-10 | Discharge: 2013-07-10 | Disposition: A | Discharge status: Home / Self Care | Payer: MEDICARE | Attending: Emergency Medicine | Admitting: Emergency Medicine

## 2013-07-10 DIAGNOSIS — N189 Chronic kidney disease, unspecified: Secondary | ICD-10-CM | POA: Insufficient documentation

## 2013-07-10 DIAGNOSIS — E781 Pure hyperglyceridemia: Secondary | ICD-10-CM | POA: Insufficient documentation

## 2013-07-10 DIAGNOSIS — R030 Elevated blood-pressure reading, without diagnosis of hypertension: Secondary | ICD-10-CM | POA: Insufficient documentation

## 2013-07-10 DIAGNOSIS — Z880 Allergy status to penicillin: Secondary | ICD-10-CM | POA: Insufficient documentation

## 2013-07-10 DIAGNOSIS — R011 Cardiac murmur, unspecified: Secondary | ICD-10-CM | POA: Insufficient documentation

## 2013-07-10 DIAGNOSIS — D45 Polycythemia vera: Secondary | ICD-10-CM | POA: Insufficient documentation

## 2013-07-10 DIAGNOSIS — I5030 Unspecified diastolic (congestive) heart failure: Secondary | ICD-10-CM | POA: Insufficient documentation

## 2013-07-10 DIAGNOSIS — I2510 Atherosclerotic heart disease of native coronary artery without angina pectoris: Secondary | ICD-10-CM | POA: Insufficient documentation

## 2013-07-10 DIAGNOSIS — I82401 Acute embolism and thrombosis of unspecified deep veins of right lower extremity: Secondary | ICD-10-CM

## 2013-07-10 DIAGNOSIS — Z79899 Other long term (current) drug therapy: Secondary | ICD-10-CM | POA: Insufficient documentation

## 2013-07-10 DIAGNOSIS — Z7902 Long term (current) use of antithrombotics/antiplatelets: Secondary | ICD-10-CM | POA: Insufficient documentation

## 2013-07-10 DIAGNOSIS — Z7982 Long term (current) use of aspirin: Secondary | ICD-10-CM | POA: Insufficient documentation

## 2013-07-10 DIAGNOSIS — I82409 Acute embolism and thrombosis of unspecified deep veins of unspecified lower extremity: Secondary | ICD-10-CM | POA: Insufficient documentation

## 2013-07-10 DIAGNOSIS — I252 Old myocardial infarction: Secondary | ICD-10-CM | POA: Insufficient documentation

## 2013-07-10 DIAGNOSIS — Z888 Allergy status to other drugs, medicaments and biological substances status: Secondary | ICD-10-CM | POA: Insufficient documentation

## 2013-07-10 DIAGNOSIS — I422 Other hypertrophic cardiomyopathy: Secondary | ICD-10-CM | POA: Insufficient documentation

## 2013-07-10 DIAGNOSIS — IMO0001 Reserved for inherently not codable concepts without codable children: Secondary | ICD-10-CM | POA: Insufficient documentation

## 2013-07-10 MED ORDER — ENOXAPARIN SODIUM 80 MG/0.8ML ~~LOC~~ SOLN
80.0000 mg | Freq: Once | SUBCUTANEOUS | Status: AC
  Administered 2013-07-10: 80 mg via SUBCUTANEOUS
  Filled 2013-07-10: qty 0.8

## 2013-07-10 MED ORDER — ENOXAPARIN SODIUM 100 MG/ML ~~LOC~~ SOLN: 1.0000 mg/kg | Freq: Every day | SUBCUTANEOUS | Status: DC

## 2013-07-10 MED ORDER — ENOXAPARIN SODIUM 150 MG/ML ~~LOC~~ SOLN: 1.0000 mg/kg | Freq: Once | SUBCUTANEOUS | Status: DC

## 2013-07-10 NOTE — ED Notes (Signed)
Pt reports being dx with a DVT in his (R) leg on Sunday.  Pt given xalareto but reports that it made him feel 'funny' and sweaty.  Reports that he went to his PCP and they put him on eliquis-states that it is going to be 'awhile' before he gets his meds from [facility].  Pt here today for treatment and meds.

## 2013-07-10 NOTE — ED Notes (Signed)
Patient has still not been brought to room.

## 2013-07-10 NOTE — ED Notes (Signed)
Patient not in room

## 2013-07-10 NOTE — Telephone Encounter (Signed)
Pt calling stating that he cannot get meds because it needs a prior authorization - nothing received by our office at this time  [facility] called  - stated that it was for a refill - informed that we have received nothing - will send again. [name], RN-BSN
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
## 2013-07-10 NOTE — ED Notes (Signed)
Patient just came to room.    Registration and pharmacy at bedside.

## 2013-07-11 ENCOUNTER — Ambulatory Visit: Payer: BC Managed Care – PPO

## 2013-07-11 NOTE — ED Provider Notes (Signed)
Medical screening examination/treatment/procedure(s) were performed by non-physician practitioner and as supervising physician I was immediately available for consultation/collaboration.  Courtney F Horton, MD 07/11/13 0827 

## 2013-07-16 ENCOUNTER — Ambulatory Visit: Payer: BC Managed Care – PPO | Admitting: Family Medicine

## 2013-07-16 ENCOUNTER — Ambulatory Visit (INDEPENDENT_AMBULATORY_CARE_PROVIDER_SITE_OTHER): Payer: BC Managed Care – PPO | Admitting: Family Medicine

## 2013-07-16 ENCOUNTER — Institutional Professional Consult (permissible substitution): Payer: Self-pay | Admitting: Medical

## 2013-07-16 ENCOUNTER — Encounter: Payer: Self-pay | Admitting: Family Medicine

## 2013-07-16 ENCOUNTER — Telehealth: Payer: Self-pay | Admitting: Internal Medicine

## 2013-07-16 VITALS — BP 140/88 | HR 64 | Wt 183.0 lb

## 2013-07-16 DIAGNOSIS — I82401 Acute embolism and thrombosis of unspecified deep veins of right lower extremity: Secondary | ICD-10-CM

## 2013-07-16 DIAGNOSIS — I82409 Acute embolism and thrombosis of unspecified deep veins of unspecified lower extremity: Secondary | ICD-10-CM

## 2013-07-16 DIAGNOSIS — D45 Polycythemia vera: Secondary | ICD-10-CM

## 2013-07-16 MED ORDER — WARFARIN SODIUM 5 MG PO TABS: 5.0000 mg | ORAL_TABLET | Freq: Every day | ORAL | Status: DC

## 2013-07-16 MED ORDER — ENOXAPARIN SODIUM 100 MG/ML ~~LOC~~ SOLN: 1.0000 mg/kg | Freq: Every day | SUBCUTANEOUS | Status: DC

## 2013-07-16 NOTE — Progress Notes (Signed)
  Subjective:    Patient ID: [name] Moreno, male    DOB: 11-Jun-1961, 52 y.o.   MRN: [id number]  HPI He is here for followup visit. He was recently diagnosed with DVT. He was initially placed on XARELTO but had adverse effects including anxiety, diaphoresis and panic. He was then switched to Equilis however his insurance would not cover this; it would only cover Coumadin.[name] He is now being covered with Lovenox and is here for followup. He also has a history of polycythemia and CKD.     Review of Systems     Objective:   Physical Exam Alert and in no distress otherwise not examined The medical record was reviewed.     Assessment & Plan:  DVT (deep venous thrombosis), right - Plan: warfarin (COUMADIN) 5 MG tablet, DISCONTINUED: enoxaparin (LOVENOX) 100 MG/ML injection  Polycythemia vera  CKD (chronic kidney disease) stage 3, GFR 30-59 ml/min  I discussed his overall care. Encouraged him to stay physically active and not take long drives. I will continue him on Lovenox and start him on 5 mg of Coumadin. He is to return here in 3 days for followup. Information concerning DVT was given to him. Discussed the fact that he will need to be on this medication and do monitoring for the next 6 months.

## 2013-07-16 NOTE — Patient Instructions (Addendum)
Stop taking the aspirin. No driving for more than 2 hours without getting out moving around. Continue on Lovenox. Come back here on Friday and we'll start the new blood workDeep Vein Thrombosis A deep vein thrombosis (DVT) is a blood clot that develops in a deep vein. A DVT is a clot in the deep, larger veins of the leg, arm, or pelvis. These are more dangerous than clots that might form in veins near the surface of the body. A DVT can lead to complications if the clot breaks off and travels in the bloodstream to the lungs.  A DVT can damage the valves in your leg veins, so that instead of flowing upwards, the blood pools in the lower leg. This is called post-thrombotic syndrome, and can result in pain, swelling, discoloration, and sores on the leg. Once identified, a DVT can be treated. It can also be prevented in some circumstances. Once you have had a DVT, you may be at increased risk for a DVT in the future. CAUSES Blood clots form in a vein for different reasons. Usually several things contribute to blood clots. Contributing factors include:  The flow of blood slows down.  The inside of the vein is damaged in some way.  The person has a condition that makes blood clot more easily. Some people are more likely than others to develop blood clots. That is because they have more factors that make clots likely. These are called risk factors. Risk factors include:   Older age, especially over 68 years old.  Having a history of blood clots. This means you have had one before. Or, it means that someone else in your family has had blood clots. You may have a genetic tendency to form clots.  Having major or lengthy surgery. This is especially true for surgery on the hip, knee, or belly (abdomen). Hip surgery is particularly high risk.  Breaking a hip or leg.  Sitting or lying still for a long time. This includes long distance travel, paralysis, or recovery from an illness or surgery.  Cancer, or  cancer treatment.  Having a long, thin tube (catheter) placed inside a vein during a medical procedure.  Being overweight (obese).  Pregnancy and childbirth. Hormone changes make the blood clot more easily during pregnancy. The fetus puts pressure on the veins of the pelvis. There is also risk of injury to veins during delivery or a caesarean. The risk is at its highest just after childbirth.  Medicines with the male hormone estrogen. This includes birth control pills and hormone replacement therapy.  Smoking.  Other circulation or heart problems. SYMPTOMS When a clot forms, it can either partially or totally block the blood flow in that vein. Symptoms of a DVT can include:  Swelling of the leg or arm, especially if one side is much worse.  Warmth and redness of the leg or arm, especially if one side is much worse.  Pain in an arm or leg. If the clot is in the leg, symptoms may be more noticeable or worse when standing or walking. The symptoms of a DVT that has traveled to the lungs (pulmonary embolism, PE) usually start suddenly, and include:  Shortness of breath.  Coughing.  Coughing up blood or blood-tinged phlegm.  Chest pain. The chest pain is often worse with deep breaths.  Rapid heartbeat. Anyone with these symptoms should get emergency medical treatment right away. Call your local emergency services (911 in [place] if you have these symptoms. DIAGNOSIS If a  DVT is suspected, your caregiver will take a full medical history and carry out a physical exam. Tests that also may be required include:  Blood tests, including studies of the clotting properties of the blood.  Ultrasonography to see if you have clots in your legs or lungs.  X-rays to show the flow of blood when dye is injected into the veins (venography).  Studies of your lungs, if you have any chest symptoms. PREVENTION  Exercise the legs regularly. Take a brisk 30 minute walk every day.  Maintain a weight  that is appropriate for your height.  Avoid sitting or lying in bed for long periods of time without moving your legs.  Women, particularly those over the age of 63, should consider the risks and benefits of taking estrogen medicines, including birth control pills.  Do not smoke, especially if you take estrogen medicines.  Long distance travel can increase your risk of DVT. You should exercise your legs by walking or pumping the muscles every hour.  In-hospital prevention:  Many of the risk factors above relate to situations that exist with hospitalization, either for illness, injury, or elective surgery.  Your caregiver will assess you for the need for venous thromboembolism prophylaxis when you are admitted to the hospital. If you are having surgery, your surgeon will assess you the day of or day after surgery.  Prevention may include medical and nonmedical measures. TREATMENT Treatment for DVT helps prevent death and disability. The most common treatment for DVT is blood thinning (anticoagulant) medicine, which reduces the blood's tendency to clot. Anticoagulants can stop new blood clots from forming and old ones from growing. They cannot dissolve existing clots. Your body does this by itself over time. Anticoagulants can be given by mouth, by intravenous (IV) access, or by injection. Your caregiver will determine the best program for you.  Heparin or related medicines (low molecular weight heparin) are usually the first treatment for a blood clot. They act quickly. However, they cannot be taken orally.  Heparin can cause a fall in a component of blood that stops bleeding and forms blood clots (platelets). You will be monitored with blood tests to be sure this does not occur.  Warfarin is an anticoagulant that can be swallowed (taken orally). It takes a few days to start working, so usually heparin or related medicines are used in combination. Once warfarin is working, heparin is usually  stopped.  Less commonly, clot dissolving drugs (thrombolytics) are used to dissolve a DVT. They carry a high risk of bleeding, so they are used mainly in severe cases, where a life or limb is threatened.  Very rarely, a blood clot in the leg needs to be removed surgically.  If you are unable to take anticoagulants, your caregiver may arrange for you to have a filter placed in a main vein in your belly (abdomen). This filter prevents clots from traveling to your lungs. HOME CARE INSTRUCTIONS  Take all medicines prescribed by your caregiver. Follow the directions carefully.  Warfarin. Most people will continue taking warfarin after hospital discharge. Your caregiver will advise you on the length of treatment (usually 3 6 months, sometimes lifelong).  Too much and too little warfarin are both dangerous. Too much warfarin increases the risk of bleeding. Too little warfarin continues to allow the risk for blood clots. While taking warfarin, you will need to have regular blood tests to measure your blood clotting time. These blood tests usually include both the prothrombin time (PT) and international  normalized ratio (INR) tests. The PT and INR results allow your caregiver to adjust your dose of warfarin. The dose can change for many reasons. It is critically important that you take warfarin exactly as prescribed, and that you have your PT and INR levels drawn exactly as directed.  Many foods, especially foods high in vitamin K can interfere with warfarin and affect the PT and INR results. Foods high in vitamin K include spinach, kale, broccoli, cabbage, collard and turnip greens, brussels sprouts, peas, cauliflower, seaweed, and parsley as well as beef and pork liver, green tea, and soybean oil. You should eat a consistent amount of foods high in vitamin K. Avoid major changes in your diet, or notify your caregiver before changing your diet. Arrange a visit with a dietitian to answer your  questions.  Many medicines can interfere with warfarin and affect the PT and INR results. You must tell your caregiver about any and all medicines you take, this includes all vitamins and supplements. Be especially cautious with aspirin and anti-inflammatory medicines. Ask your caregiver before taking these. Do not take or discontinue any prescribed or over-the-counter medicine except on the advice of your caregiver or pharmacist.  Warfarin can have side effects, primarily excessive bruising or bleeding. You will need to hold pressure over cuts for longer than usual. Your caregiver or pharmacist will discuss other potential side effects.  Alcohol can change the body's ability to handle warfarin. It is best to avoid alcoholic drinks or consume only very small amounts while taking warfarin. Notify your caregiver if you change your alcohol intake.  Notify your dentist or other caregivers before procedures.  Activity. Ask your caregiver how soon you can go back to normal activities. It is important to stay active to prevent blood clots. If you are on anticoagulant medicine, avoid contact sports.  Exercise. It is very important to exercise. This is especially important while traveling, sitting or standing for long periods of time. Exercise your legs by walking or by pumping the muscles frequently. Take frequent walks.  Compression stockings. These are tight elastic stockings that apply pressure to the lower legs. This pressure can help keep the blood in the legs from clotting. You may need to wear compressions stockings at home to help prevent a DVT.  Smoking. If you smoke, quit. Ask your caregiver for help with quitting smoking.  Learn as much as you can about DVT. Knowing more about the condition should help you keep it from coming back.  Wear a medical alert bracelet or carry a medical alert card. SEEK MEDICAL CARE IF:  You notice a rapid heartbeat.  You feel weaker or more tired than  usual.  You feel faint.  You notice increased bruising.  You feel your symptoms are not getting better in the time expected.  You believe you are having side effects of medicine. SEEK IMMEDIATE MEDICAL CARE IF:  You have chest pain.  You have trouble breathing.  You have new or increased swelling or pain in one leg.  You cough up blood.  You notice blood in vomit, in a bowel movement, or in urine. MAKE SURE YOU:  Understand these instructions.  Will watch your condition.  Will get help right away if you are not doing well or get worse. Document Released: 10/31/2005 Document Revised: 07/25/2012 Document Reviewed: 12/23/2010 [facility] Patient Information 2014 [place], [place].

## 2013-07-16 NOTE — Telephone Encounter (Signed)
Med was printed instead of sending to pharmacy

## 2013-07-17 ENCOUNTER — Encounter (HOSPITAL_COMMUNITY): Payer: Self-pay | Admitting: Emergency Medicine

## 2013-07-17 ENCOUNTER — Emergency Department (HOSPITAL_COMMUNITY)
Admission: EM | Admit: 2013-07-17 | Discharge: 2013-07-17 | Disposition: A | Discharge status: Home / Self Care | Payer: MEDICARE | Attending: Emergency Medicine | Admitting: Emergency Medicine

## 2013-07-17 ENCOUNTER — Emergency Department (HOSPITAL_COMMUNITY): Payer: MEDICARE

## 2013-07-17 DIAGNOSIS — J069 Acute upper respiratory infection, unspecified: Secondary | ICD-10-CM | POA: Insufficient documentation

## 2013-07-17 DIAGNOSIS — Z79899 Other long term (current) drug therapy: Secondary | ICD-10-CM | POA: Insufficient documentation

## 2013-07-17 DIAGNOSIS — Z95810 Presence of automatic (implantable) cardiac defibrillator: Secondary | ICD-10-CM | POA: Insufficient documentation

## 2013-07-17 DIAGNOSIS — Z880 Allergy status to penicillin: Secondary | ICD-10-CM | POA: Insufficient documentation

## 2013-07-17 DIAGNOSIS — Z7982 Long term (current) use of aspirin: Secondary | ICD-10-CM | POA: Insufficient documentation

## 2013-07-17 DIAGNOSIS — N189 Chronic kidney disease, unspecified: Secondary | ICD-10-CM | POA: Insufficient documentation

## 2013-07-17 DIAGNOSIS — R112 Nausea with vomiting, unspecified: Secondary | ICD-10-CM | POA: Insufficient documentation

## 2013-07-17 DIAGNOSIS — Z7901 Long term (current) use of anticoagulants: Secondary | ICD-10-CM | POA: Insufficient documentation

## 2013-07-17 DIAGNOSIS — I252 Old myocardial infarction: Secondary | ICD-10-CM | POA: Insufficient documentation

## 2013-07-17 DIAGNOSIS — I5030 Unspecified diastolic (congestive) heart failure: Secondary | ICD-10-CM | POA: Insufficient documentation

## 2013-07-17 DIAGNOSIS — Z862 Personal history of diseases of the blood and blood-forming organs and certain disorders involving the immune mechanism: Secondary | ICD-10-CM | POA: Insufficient documentation

## 2013-07-17 DIAGNOSIS — Z8639 Personal history of other endocrine, nutritional and metabolic disease: Secondary | ICD-10-CM | POA: Insufficient documentation

## 2013-07-17 DIAGNOSIS — I2510 Atherosclerotic heart disease of native coronary artery without angina pectoris: Secondary | ICD-10-CM | POA: Insufficient documentation

## 2013-07-17 LAB — COMPREHENSIVE METABOLIC PANEL
Albumin: 3.7 g/dL (ref 3.5–5.2)
Alkaline Phosphatase: 64 U/L (ref 39–117)
BUN: 16 mg/dL (ref 6–23)
Chloride: 103 mEq/L (ref 96–112)
Creatinine, Ser: 1.54 mg/dL — ABNORMAL HIGH (ref 0.50–1.35)
GFR calc Af Amer: 59 mL/min — ABNORMAL LOW (ref >90)
Glucose, Bld: 120 mg/dL — ABNORMAL HIGH (ref 70–99)
Total Bilirubin: 0.3 mg/dL (ref 0.3–1.2)
Total Protein: 7 g/dL (ref 6.0–8.3)

## 2013-07-17 LAB — URINE MICROSCOPIC-ADD ON

## 2013-07-17 LAB — PROTIME-INR
INR: 0.94 (ref 0.00–1.49)
Prothrombin Time: 12.4 seconds (ref 11.6–15.2)

## 2013-07-17 LAB — CBC WITH DIFFERENTIAL/PLATELET
Basophils Relative: 0 % (ref 0–1)
Eosinophils Absolute: 0.1 10*3/uL (ref 0.0–0.7)
HCT: 45 % (ref 39.0–52.0)
Hemoglobin: 16.7 g/dL (ref 13.0–17.0)
Lymphs Abs: 0.8 10*3/uL (ref 0.7–4.0)
MCH: 36.9 pg — ABNORMAL HIGH (ref 26.0–34.0)
MCHC: 37.1 g/dL — ABNORMAL HIGH (ref 30.0–36.0)
Monocytes Absolute: 0.4 10*3/uL (ref 0.1–1.0)
Monocytes Relative: 7 % (ref 3–12)
RBC: 4.53 MIL/uL (ref 4.22–5.81)

## 2013-07-17 LAB — URINALYSIS, ROUTINE W REFLEX MICROSCOPIC
Glucose, UA: 100 mg/dL — AB
Ketones, ur: NEGATIVE mg/dL
Leukocytes, UA: NEGATIVE
pH: 5.5 (ref 5.0–8.0)

## 2013-07-17 MED ORDER — CETIRIZINE HCL 10 MG PO TABS: 10.0000 mg | ORAL_TABLET | Freq: Every day | ORAL | Status: DC

## 2013-07-17 MED ORDER — DEXTROMETHORPHAN HBR 15 MG/5ML PO SYRP: 10.0000 mL | ORAL_SOLUTION | Freq: Four times a day (QID) | ORAL | Status: DC | PRN
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
## 2013-07-17 NOTE — ED Notes (Signed)
Pt c/o N/V and fever starting last night; pt sts some cough; pt sts hx of recent DVT but has not started blood thinner yet

## 2013-07-17 NOTE — ED Notes (Addendum)
Chest tenderness but pt states no real pain

## 2013-07-18 ENCOUNTER — Institutional Professional Consult (permissible substitution): Payer: Self-pay | Admitting: Medical

## 2013-07-19 ENCOUNTER — Other Ambulatory Visit: Payer: BC Managed Care – PPO

## 2013-07-19 DIAGNOSIS — Z7901 Long term (current) use of anticoagulants: Secondary | ICD-10-CM

## 2013-07-19 LAB — URINE CULTURE
Culture: NO GROWTH
Special Requests: NORMAL

## 2013-07-20 NOTE — ED Provider Notes (Signed)
Medical screening examination/treatment/procedure(s) were performed by non-physician practitioner and as supervising physician I was immediately available for consultation/collaboration.  Candyce Churn, MD 07/20/13 [phone]

## 2013-07-22 ENCOUNTER — Telehealth: Payer: Self-pay

## 2013-07-22 NOTE — Telephone Encounter (Signed)
DR.LALONDE I GOT IN TOUCH WITH THE PATIENT HE SAID HE STARTED THE 1 1/2 TAB. ON Saturday BUT HE CAN NOT COME BACK TILL 9/16 HE IS IN [place] DO YOU WANT HIM TO STAY ON THE 1 1/2 TABS. THAT LONG PLEASE ADVISE

## 2013-07-22 NOTE — Telephone Encounter (Signed)
Later this week he will need to go probably to an urgent care Center to have a PT/INR done or if they can find a blood drawing Center, I can send a order. Please recheck his PT/INR sometime this week and should not wait till next.

## 2013-07-22 NOTE — Progress Notes (Signed)
Quick Note:  LEFT MESSAGE OF DID HE GET MESSAGE FROM SHANE AND IF HE DID START CHANGE OF MED TO PLESE CALL AND LET us KNOW SO WE COULD SCHEDULE HIM FOR NURSE VISIT TUESDAY ______

## 2013-07-22 NOTE — Telephone Encounter (Signed)
PT SAID HE WILL HAVE DONE ON Thursday AND SEND [place] RESULTS

## 2013-07-25 ENCOUNTER — Telehealth: Payer: Self-pay | Admitting: Family Medicine

## 2013-07-25 NOTE — Telephone Encounter (Signed)
Pt informed he already had appointment 9/16

## 2013-07-25 NOTE — Telephone Encounter (Signed)
As soon as possible -

## 2013-07-30 ENCOUNTER — Other Ambulatory Visit: Payer: Self-pay

## 2013-07-31 ENCOUNTER — Other Ambulatory Visit: Payer: Self-pay

## 2013-08-01 ENCOUNTER — Other Ambulatory Visit (INDEPENDENT_AMBULATORY_CARE_PROVIDER_SITE_OTHER): Payer: BC Managed Care – PPO

## 2013-08-01 DIAGNOSIS — Z23 Encounter for immunization: Secondary | ICD-10-CM

## 2013-08-01 DIAGNOSIS — Z79899 Other long term (current) drug therapy: Secondary | ICD-10-CM

## 2013-08-01 LAB — PROTIME-INR: INR: 2.99 — ABNORMAL HIGH (ref <1.50)

## 2013-08-01 NOTE — Addendum Note (Signed)
Addended by: Debbrah Alar F on: 08/01/2013 03:39 PM   Modules accepted: Orders

## 2013-08-02 NOTE — Progress Notes (Signed)
Quick Note:  PT INFORMED WORD FOR WORD HE VERBALIZED UNDERSTANDING AND PT IS TAKING 7.5 MG OF WARFARIN DAILY ______

## 2013-08-03 ENCOUNTER — Telehealth: Payer: Self-pay | Admitting: *Deleted

## 2013-08-03 NOTE — Telephone Encounter (Signed)
sw pt informed him that his appt for 08/09/13 is now scheduled for 08/08/13 w/labs@ 1pm, ov@ 1:30p, and phelb to follow. The pt is aware...td

## 2013-08-07 ENCOUNTER — Other Ambulatory Visit: Payer: Self-pay | Admitting: Family

## 2013-08-07 DIAGNOSIS — D45 Polycythemia vera: Secondary | ICD-10-CM

## 2013-08-08 ENCOUNTER — Telehealth: Payer: Self-pay | Admitting: Hematology and Oncology

## 2013-08-08 ENCOUNTER — Ambulatory Visit: Payer: MEDICARE | Admitting: Family

## 2013-08-08 ENCOUNTER — Telehealth: Payer: Self-pay | Admitting: *Deleted

## 2013-08-08 ENCOUNTER — Other Ambulatory Visit: Payer: MEDICARE | Admitting: Lab

## 2013-08-08 NOTE — Telephone Encounter (Signed)
Per scheduler voicemail, I have moved 10/10 appt to tomorrow

## 2013-08-08 NOTE — Telephone Encounter (Signed)
Due to NG out of office today pt appt moved from 9/25 to 10/10 - date per pt - ok per NG. gv pt new d/t for lb/NG/phleb 10/10 @ 12:45pm. Pt has another existing appt the same day @ 1:45pm which pt states he will cancel.

## 2013-08-08 NOTE — Telephone Encounter (Signed)
Pt called and very irate, called Vanessa Barbara. he requested to be seen tomorrow,lab, ML and phlebotomy

## 2013-08-09 ENCOUNTER — Telehealth: Payer: Self-pay | Admitting: Internal Medicine

## 2013-08-09 ENCOUNTER — Ambulatory Visit (HOSPITAL_BASED_OUTPATIENT_CLINIC_OR_DEPARTMENT_OTHER): Payer: MEDICARE

## 2013-08-09 ENCOUNTER — Other Ambulatory Visit (HOSPITAL_BASED_OUTPATIENT_CLINIC_OR_DEPARTMENT_OTHER): Payer: MEDICARE | Admitting: Lab

## 2013-08-09 ENCOUNTER — Ambulatory Visit (HOSPITAL_BASED_OUTPATIENT_CLINIC_OR_DEPARTMENT_OTHER): Payer: MEDICARE | Admitting: Physician Assistant

## 2013-08-09 ENCOUNTER — Other Ambulatory Visit: Payer: Self-pay | Admitting: *Deleted

## 2013-08-09 ENCOUNTER — Other Ambulatory Visit: Payer: MEDICARE | Admitting: Lab

## 2013-08-09 ENCOUNTER — Ambulatory Visit: Payer: MEDICARE | Admitting: Oncology

## 2013-08-09 VITALS — BP 132/86 | HR 66 | Temp 97.0°F | Resp 19 | Ht 66.0 in | Wt 187.9 lb

## 2013-08-09 DIAGNOSIS — D45 Polycythemia vera: Secondary | ICD-10-CM

## 2013-08-09 LAB — CBC WITH DIFFERENTIAL/PLATELET
Basophils Absolute: 0 10*3/uL (ref 0.0–0.1)
HCT: 52.1 % — ABNORMAL HIGH (ref 38.4–49.9)
HGB: 18.1 g/dL — ABNORMAL HIGH (ref 13.0–17.1)
MCH: 35.7 pg — ABNORMAL HIGH (ref 27.2–33.4)
MONO#: 0.3 10*3/uL (ref 0.1–0.9)
NEUT%: 58.4 % (ref 39.0–75.0)
Platelets: 109 10*3/uL — ABNORMAL LOW (ref 140–400)
WBC: 4.3 10*3/uL (ref 4.0–10.3)
lymph#: 1.3 10*3/uL (ref 0.9–3.3)

## 2013-08-09 LAB — COMPREHENSIVE METABOLIC PANEL (CC13)
BUN: 15.1 mg/dL (ref 7.0–26.0)
CO2: 25 mEq/L (ref 22–29)
Calcium: 8.9 mg/dL (ref 8.4–10.4)
Chloride: 105 mEq/L (ref 98–109)
Creatinine: 1.6 mg/dL — ABNORMAL HIGH (ref 0.7–1.3)

## 2013-08-09 NOTE — Telephone Encounter (Signed)
Gave pt appt for October and November 2014 lab,phlebotmy,MD

## 2013-08-09 NOTE — Telephone Encounter (Signed)
Gave pt appt for lab,MD and Phlebotomy until December 2014

## 2013-08-09 NOTE — Progress Notes (Signed)
Phlebotomy performed via right antecubital vein.  596 cc removed without difficulty. Patient tolerated procedure well. Snack and drink provided.
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
## 2013-08-09 NOTE — Patient Instructions (Addendum)

## 2013-08-10 NOTE — Patient Instructions (Addendum)
Continue monthly lab and phlebotomy visits as needed to keep your hematocrit less than or equal to 45% Followup with Dr. Rosie Fate in 2 months for another symptom management visit

## 2013-08-12 NOTE — Progress Notes (Signed)
  ADDENDUM:  Patient is a pleasant 52 year old [place] American male with a history of chronic polycythemia with a negative JAK2 (+ in nearly 95% cases of Polycythemia vera) and RLE Deep venous thrombosis on coumadin (felt to be provoked by immobility when driving prolonged distances) who presents to re-stablish hematological care. Today, his hematocrit is 52.1 and he reports dyspnea and fatigue, so he will require phlebotomy.  We will check monthly CBC and phlebotomy appointments.  Our goal with be a HCT less than or equal to 45%. Hydrea will be considered during the next appointment  to also decrease risk of clot given recent evidence of RLE DVT.   He will follow-up with me in 2 months with repeat CBC, chemistries. He is advised to followup with his primary care physician regarding his warfarin therapy and monitoring period in his established nephrologist treat regarding his renal insufficiency.   I personally saw this patient and performed a substantive portion of this encounter with the listed APP documented above.   [name] Langland, MD

## 2013-08-20 ENCOUNTER — Telehealth: Payer: Self-pay | Admitting: Family Medicine

## 2013-08-20 NOTE — Telephone Encounter (Signed)
Pt has appointment 11:45 10/8

## 2013-08-20 NOTE — Telephone Encounter (Signed)
Have him come in sometime this week

## 2013-08-21 ENCOUNTER — Ambulatory Visit (INDEPENDENT_AMBULATORY_CARE_PROVIDER_SITE_OTHER): Payer: BC Managed Care – PPO | Admitting: Family Medicine

## 2013-08-21 ENCOUNTER — Encounter: Payer: Self-pay | Admitting: Family Medicine

## 2013-08-21 ENCOUNTER — Other Ambulatory Visit: Payer: Self-pay

## 2013-08-21 VITALS — BP 126/70 | HR 75 | Wt 187.0 lb

## 2013-08-21 DIAGNOSIS — I82409 Acute embolism and thrombosis of unspecified deep veins of unspecified lower extremity: Secondary | ICD-10-CM

## 2013-08-21 DIAGNOSIS — D45 Polycythemia vera: Secondary | ICD-10-CM

## 2013-08-21 DIAGNOSIS — I82401 Acute embolism and thrombosis of unspecified deep veins of right lower extremity: Secondary | ICD-10-CM

## 2013-08-21 DIAGNOSIS — Z79899 Other long term (current) drug therapy: Secondary | ICD-10-CM

## 2013-08-21 LAB — PROTIME-INR
INR: 1.79 — ABNORMAL HIGH (ref <1.50)
Prothrombin Time: 20.1 seconds — ABNORMAL HIGH (ref 11.6–15.2)

## 2013-08-21 NOTE — Progress Notes (Signed)
  Subjective:    Patient ID: [name] Moreno, male    DOB: 1961-02-02, 52 y.o.   MRN: [id number]  HPI He is here for followup visit. He has had no difficulty taking the Coumadin. His last test was approximately 2 weeks ago and was therapeutic. He had difficulty with XARELTO and was therefore placed on Coumadin. He also has an underlying history of polycythemia vera and is now presently being followed by hematology.   Review of Systems     Objective:   Physical Exam Alert and in no distress otherwise not examined       Assessment & Plan:  Polycythemia vera - Plan: CANCELED: Ambulatory referral to Hematology  DVT (deep venous thrombosis), right - Plan: Protime-INR  plan that if his PT/INR is therapeutic, we can then see him monthly but continue the total course for 6 months.

## 2013-08-23 ENCOUNTER — Other Ambulatory Visit: Payer: MEDICARE | Admitting: Lab

## 2013-08-23 ENCOUNTER — Ambulatory Visit: Payer: MEDICARE | Admitting: Hematology and Oncology

## 2013-08-23 ENCOUNTER — Ambulatory Visit: Payer: BC Managed Care – PPO | Admitting: Family Medicine

## 2013-08-28 ENCOUNTER — Other Ambulatory Visit: Payer: Self-pay | Admitting: Family Medicine

## 2013-08-28 ENCOUNTER — Other Ambulatory Visit: Payer: BC Managed Care – PPO

## 2013-08-28 ENCOUNTER — Other Ambulatory Visit: Payer: Self-pay

## 2013-08-28 DIAGNOSIS — Z79899 Other long term (current) drug therapy: Secondary | ICD-10-CM

## 2013-08-28 LAB — PROTIME-INR
INR: 1.08 (ref <1.50)
Prothrombin Time: 13.7 seconds (ref 11.6–15.2)

## 2013-08-28 MED ORDER — WARFARIN SODIUM 5 MG PO TABS: 10.0000 mg | ORAL_TABLET | Freq: Every day | ORAL | Status: DC

## 2013-08-28 NOTE — Progress Notes (Signed)
Quick Note:  Ordered med and set appointment up for the 10/21 ______

## 2013-08-28 NOTE — Telephone Encounter (Signed)
Sent in med for 10 mg a day

## 2013-08-28 NOTE — Progress Notes (Signed)
Quick Note:  I discussed his lab work with him. He states he is eating no greens at all. Increase his Coumadin to 10 mg. Set him up for a stat PT/INR next Tuesday. ______

## 2013-09-03 ENCOUNTER — Other Ambulatory Visit: Payer: BC Managed Care – PPO

## 2013-09-03 DIAGNOSIS — Z79899 Other long term (current) drug therapy: Secondary | ICD-10-CM

## 2013-09-04 NOTE — Progress Notes (Signed)
Quick Note:  CALLED PT # PT INFORMED AND MADE APPOINTMENT FOR 11/6 ______

## 2013-09-06 ENCOUNTER — Other Ambulatory Visit (HOSPITAL_BASED_OUTPATIENT_CLINIC_OR_DEPARTMENT_OTHER): Payer: MEDICARE | Admitting: Lab

## 2013-09-06 ENCOUNTER — Ambulatory Visit (HOSPITAL_BASED_OUTPATIENT_CLINIC_OR_DEPARTMENT_OTHER): Payer: MEDICARE

## 2013-09-06 VITALS — BP 126/84 | HR 63 | Temp 97.9°F | Resp 16

## 2013-09-06 DIAGNOSIS — D45 Polycythemia vera: Secondary | ICD-10-CM

## 2013-09-06 LAB — CBC WITH DIFFERENTIAL/PLATELET
Eosinophils Absolute: 0.1 10*3/uL (ref 0.0–0.5)
LYMPH%: 20.4 % (ref 14.0–49.0)
MCHC: 35.5 g/dL (ref 32.0–36.0)
MCV: 99.6 fL — ABNORMAL HIGH (ref 79.3–98.0)
MONO%: 6.3 % (ref 0.0–14.0)
NEUT#: 3.4 10*3/uL (ref 1.5–6.5)
Platelets: 123 10*3/uL — ABNORMAL LOW (ref 140–400)
RBC: 4.56 10*6/uL (ref 4.20–5.82)

## 2013-09-06 NOTE — Patient Instructions (Signed)
Therapeutic Phlebotomy Therapeutic phlebotomy is the controlled removal of blood from your body for the purpose of treating a medical condition. It is similar to donating blood. Usually, about a pint (470 mL) of blood is removed. The average adult has 9 to 12 pints (4.3 to 5.7 L) of blood. Therapeutic phlebotomy may be used to treat the following medical conditions:  Hemochromatosis. This is a condition in which there is too much iron in the blood.  Polycythemia vera. This is a condition in which there are too many red cells in the blood.  Porphyria cutanea tarda. This is a disease usually passed from one generation to the next (inherited). It is a condition in which an important part of hemoglobin is not made properly. This results in the build up of abnormal amounts of porphyrins in the body.  Sickle cell disease. This is an inherited disease. It is a condition in which the red blood cells form an abnormal crescent shape rather than a round shape. LET YOUR CAREGIVER KNOW ABOUT:  Allergies.  Medicines taken including herbs, eyedrops, over-the-counter medicines, and creams.  Use of steroids (by mouth or creams).  Previous problems with anesthetics or numbing medicine.  History of blood clots.  History of bleeding or blood problems.  Previous surgery.  Possibility of pregnancy, if this applies. RISKS AND COMPLICATIONS This is a simple and safe procedure. Problems are unlikely. However, problems can occur and may include:  Nausea or lightheadedness.  Low blood pressure.  Soreness, bleeding, swelling, or bruising at the needle insertion site.  Infection. BEFORE THE PROCEDURE  This is a procedure that can be done as an outpatient. Confirm the time that you need to arrive for your procedure. Confirm whether there is a need to fast or withhold any medications. It is helpful to wear clothing with sleeves that can be raised above the elbow. A blood sample may be done to determine the  amount of red blood cells or iron in your blood. Plan ahead of time to have someone drive you home after the procedure. PROCEDURE The entire procedure from preparation through recovery takes about 1 hour. The actual collection takes about 10 to 15 minutes.  A needle will be inserted into your vein.  Tubing and a collection bag will be attached to that needle.  Blood will flow through the needle and tubing into the collection bag.  You may be asked to open and close your hand slowly and continuously during the entire collection.  Once the specified amount of blood has been removed from your body, the collection bag and tubing will be clamped.  The needle will be removed.  Pressure will be held on the site of the needle insertion to stop the bleeding. Then a bandage will be placed over the needle insertion site. AFTER THE PROCEDURE  Your recovery will be assessed and monitored. If there are no problems, as an outpatient, you should be able to go home shortly after the procedure.  Document Released: 04/04/2011 Document Revised: 01/23/2012 Document Reviewed: 04/04/2011 [employer] Patient Information 2014 [employer].  

## 2013-09-06 NOTE — Progress Notes (Signed)
Phlebotomy performed in left posterior forearm with 18G angiocath without difficulty.  Approx. 70g of blood obtained and wasted.  Catheter clotted.  Angiocath d/c intact, site unremarkable.   Phlebotomy restarted in right antecubital with 16G needle without difficulty.  Approx.  500g of blood obtained and wasted.  Pt tolerated procedure without problems.  Nourishments given. Pt was stable at discharge via ambulating.

## 2013-09-09 ENCOUNTER — Other Ambulatory Visit: Payer: MEDICARE | Admitting: Lab

## 2013-09-19 ENCOUNTER — Emergency Department (HOSPITAL_COMMUNITY): Payer: MEDICARE

## 2013-09-19 ENCOUNTER — Observation Stay (HOSPITAL_COMMUNITY)
Admission: EM | Admit: 2013-09-19 | Discharge: 2013-09-20 | Disposition: A | Discharge status: Home / Self Care | Payer: MEDICARE | Attending: Cardiology | Admitting: Cardiology

## 2013-09-19 ENCOUNTER — Telehealth: Payer: Self-pay | Admitting: Internal Medicine

## 2013-09-19 ENCOUNTER — Encounter (HOSPITAL_COMMUNITY): Payer: Self-pay | Admitting: Emergency Medicine

## 2013-09-19 ENCOUNTER — Other Ambulatory Visit: Payer: BC Managed Care – PPO

## 2013-09-19 ENCOUNTER — Other Ambulatory Visit: Payer: Self-pay

## 2013-09-19 DIAGNOSIS — I5032 Chronic diastolic (congestive) heart failure: Secondary | ICD-10-CM

## 2013-09-19 DIAGNOSIS — R079 Chest pain, unspecified: Principal | ICD-10-CM | POA: Insufficient documentation

## 2013-09-19 DIAGNOSIS — M62838 Other muscle spasm: Secondary | ICD-10-CM | POA: Insufficient documentation

## 2013-09-19 DIAGNOSIS — Z7901 Long term (current) use of anticoagulants: Secondary | ICD-10-CM | POA: Insufficient documentation

## 2013-09-19 DIAGNOSIS — I509 Heart failure, unspecified: Secondary | ICD-10-CM | POA: Insufficient documentation

## 2013-09-19 DIAGNOSIS — Z9889 Other specified postprocedural states: Secondary | ICD-10-CM

## 2013-09-19 DIAGNOSIS — M549 Dorsalgia, unspecified: Secondary | ICD-10-CM | POA: Insufficient documentation

## 2013-09-19 DIAGNOSIS — I10 Essential (primary) hypertension: Secondary | ICD-10-CM

## 2013-09-19 DIAGNOSIS — N183 Chronic kidney disease, stage 3 unspecified: Secondary | ICD-10-CM | POA: Insufficient documentation

## 2013-09-19 DIAGNOSIS — Z8679 Personal history of other diseases of the circulatory system: Secondary | ICD-10-CM

## 2013-09-19 DIAGNOSIS — Z79899 Other long term (current) drug therapy: Secondary | ICD-10-CM

## 2013-09-19 DIAGNOSIS — Z8674 Personal history of sudden cardiac arrest: Secondary | ICD-10-CM | POA: Insufficient documentation

## 2013-09-19 DIAGNOSIS — R0789 Other chest pain: Secondary | ICD-10-CM

## 2013-09-19 DIAGNOSIS — I421 Obstructive hypertrophic cardiomyopathy: Secondary | ICD-10-CM | POA: Insufficient documentation

## 2013-09-19 DIAGNOSIS — R791 Abnormal coagulation profile: Secondary | ICD-10-CM | POA: Insufficient documentation

## 2013-09-19 DIAGNOSIS — I129 Hypertensive chronic kidney disease with stage 1 through stage 4 chronic kidney disease, or unspecified chronic kidney disease: Secondary | ICD-10-CM | POA: Insufficient documentation

## 2013-09-19 DIAGNOSIS — I447 Left bundle-branch block, unspecified: Secondary | ICD-10-CM | POA: Insufficient documentation

## 2013-09-19 HISTORY — DX: Presence of automatic (implantable) cardiac defibrillator: Z95.810

## 2013-09-19 HISTORY — DX: Unspecified coma: R40.20

## 2013-09-19 HISTORY — DX: Acute embolism and thrombosis of unspecified deep veins of unspecified lower extremity: I82.409

## 2013-09-19 HISTORY — DX: Strain of muscle, fascia and tendon of other parts of biceps, right arm, initial encounter: S46.211A

## 2013-09-19 HISTORY — DX: Other pulmonary collapse: J98.19

## 2013-09-19 HISTORY — DX: Other complications of anesthesia, initial encounter: T88.59XA

## 2013-09-19 HISTORY — DX: Chronic kidney disease, stage 3 unspecified: N18.30

## 2013-09-19 HISTORY — DX: Chronic kidney disease, stage 3 (moderate): N18.3

## 2013-09-19 HISTORY — DX: Gout, unspecified: M10.9

## 2013-09-19 HISTORY — DX: Gastro-esophageal reflux disease without esophagitis: K21.9

## 2013-09-19 HISTORY — DX: Pneumonia, unspecified organism: J18.9

## 2013-09-19 HISTORY — DX: Cardiac murmur, unspecified: R01.1

## 2013-09-19 HISTORY — DX: Headache: R51

## 2013-09-19 HISTORY — DX: Adverse effect of unspecified anesthetic, initial encounter: T41.45XA

## 2013-09-19 HISTORY — DX: Ventricular fibrillation: I49.01

## 2013-09-19 HISTORY — DX: Sleep apnea, unspecified: G47.30

## 2013-09-19 HISTORY — DX: Personal history of other medical treatment: Z92.89

## 2013-09-19 HISTORY — DX: Migraine, unspecified, not intractable, without status migrainosus: G43.909

## 2013-09-19 HISTORY — DX: Lyme disease, unspecified: A69.20

## 2013-09-19 LAB — HEPATIC FUNCTION PANEL
Albumin: 3.6 g/dL (ref 3.5–5.2)
Alkaline Phosphatase: 77 U/L (ref 39–117)
Bilirubin, Direct: <0.1 mg/dL (ref 0.0–0.3)
Total Bilirubin: 0.2 mg/dL — ABNORMAL LOW (ref 0.3–1.2)

## 2013-09-19 LAB — BASIC METABOLIC PANEL
BUN: 16 mg/dL (ref 6–23)
Calcium: 9.5 mg/dL (ref 8.4–10.5)
Creatinine, Ser: 1.41 mg/dL — ABNORMAL HIGH (ref 0.50–1.35)
GFR calc non Af Amer: 56 mL/min — ABNORMAL LOW (ref >90)
Glucose, Bld: 80 mg/dL (ref 70–99)
Sodium: 136 mEq/L (ref 135–145)

## 2013-09-19 LAB — CBC
Hemoglobin: 15.5 g/dL (ref 13.0–17.0)
MCH: 34.3 pg — ABNORMAL HIGH (ref 26.0–34.0)
MCHC: 36 g/dL (ref 30.0–36.0)

## 2013-09-19 LAB — PROTIME-INR
INR: 6.38 (ref 0.00–1.49)
Prothrombin Time: 55.6 seconds — ABNORMAL HIGH (ref 11.6–15.2)
Prothrombin Time: 53.5 seconds — ABNORMAL HIGH (ref 11.6–15.2)

## 2013-09-19 LAB — POCT I-STAT TROPONIN I: Troponin i, poc: 0.06 ng/mL (ref 0.00–0.08)

## 2013-09-19 LAB — PRO B NATRIURETIC PEPTIDE: Pro B Natriuretic peptide (BNP): 518.2 pg/mL — ABNORMAL HIGH (ref 0–125)

## 2013-09-19 LAB — TROPONIN I: Troponin I: <0.3 ng/mL (ref <0.30)

## 2013-09-19 MED ORDER — PANTOPRAZOLE SODIUM 40 MG PO TBEC
40.0000 mg | DELAYED_RELEASE_TABLET | Freq: Every day | ORAL | Status: DC
  Administered 2013-09-20: 40 mg via ORAL
  Filled 2013-09-19: qty 1

## 2013-09-19 MED ORDER — ASPIRIN 325 MG PO TABS
325.0000 mg | ORAL_TABLET | Freq: Once | ORAL | Status: AC
  Administered 2013-09-19: 325 mg via ORAL
  Filled 2013-09-19: qty 1

## 2013-09-19 MED ORDER — NITROGLYCERIN 2 % TD OINT
1.0000 [in_us] | TOPICAL_OINTMENT | Freq: Once | TRANSDERMAL | Status: AC
  Administered 2013-09-19: 1 [in_us] via TOPICAL
  Filled 2013-09-19: qty 1

## 2013-09-19 MED ORDER — ENALAPRIL MALEATE 10 MG PO TABS
10.0000 mg | ORAL_TABLET | Freq: Two times a day (BID) | ORAL | Status: DC
  Filled 2013-09-19: qty 1

## 2013-09-19 MED ORDER — METOPROLOL SUCCINATE ER 100 MG PO TB24: 100.0000 mg | ORAL_TABLET | Freq: Every day | ORAL | Status: DC

## 2013-09-19 MED ORDER — METOPROLOL SUCCINATE ER 50 MG PO TB24
150.0000 mg | ORAL_TABLET | Freq: Every day | ORAL | Status: DC
  Administered 2013-09-20: 150 mg via ORAL
  Filled 2013-09-19: qty 1

## 2013-09-19 MED ORDER — GI COCKTAIL ~~LOC~~
30.0000 mL | Freq: Once | ORAL | Status: AC
  Administered 2013-09-19: 30 mL via ORAL
  Filled 2013-09-19: qty 30

## 2013-09-19 MED ORDER — ACETAMINOPHEN 325 MG PO TABS
650.0000 mg | ORAL_TABLET | ORAL | Status: DC | PRN
  Administered 2013-09-20: 650 mg via ORAL
  Filled 2013-09-19: qty 2

## 2013-09-19 MED ORDER — MORPHINE SULFATE 4 MG/ML IJ SOLN
4.0000 mg | Freq: Once | INTRAMUSCULAR | Status: AC
  Administered 2013-09-19: 4 mg via INTRAVENOUS
  Filled 2013-09-19: qty 1

## 2013-09-19 MED ORDER — LOPERAMIDE HCL 2 MG PO CAPS: 12.0000 mg | ORAL_CAPSULE | Freq: Every day | ORAL | Status: DC | PRN

## 2013-09-19 MED ORDER — ZOLPIDEM TARTRATE 5 MG PO TABS: 5.0000 mg | ORAL_TABLET | Freq: Every evening | ORAL | Status: DC | PRN

## 2013-09-19 MED ORDER — SODIUM CHLORIDE 0.9 % IJ SOLN
3.0000 mL | Freq: Two times a day (BID) | INTRAMUSCULAR | Status: DC
  Administered 2013-09-19 – 2013-09-20 (×2): 3 mL via INTRAVENOUS

## 2013-09-19 MED ORDER — ALPRAZOLAM 0.25 MG PO TABS: 0.2500 mg | ORAL_TABLET | Freq: Two times a day (BID) | ORAL | Status: DC | PRN

## 2013-09-19 MED ORDER — SODIUM CHLORIDE 0.9 % IJ SOLN: 3.0000 mL | INTRAMUSCULAR | Status: DC | PRN

## 2013-09-19 MED ORDER — VERAPAMIL HCL ER 240 MG PO TBCR
240.0000 mg | EXTENDED_RELEASE_TABLET | Freq: Every day | ORAL | Status: DC
  Administered 2013-09-20: 240 mg via ORAL
  Filled 2013-09-19 (×2): qty 1

## 2013-09-19 MED ORDER — MECLIZINE HCL 25 MG PO TABS
25.0000 mg | ORAL_TABLET | Freq: Three times a day (TID) | ORAL | Status: DC | PRN
  Filled 2013-09-19: qty 1

## 2013-09-19 MED ORDER — ADULT MULTIVITAMIN W/MINERALS CH
1.0000 | ORAL_TABLET | Freq: Every day | ORAL | Status: DC
  Administered 2013-09-20: 11:00:00 1 via ORAL
  Filled 2013-09-19: qty 1

## 2013-09-19 MED ORDER — NITROGLYCERIN 2 % TD OINT
0.5000 [in_us] | TOPICAL_OINTMENT | Freq: Three times a day (TID) | TRANSDERMAL | Status: DC
  Administered 2013-09-19 – 2013-09-20 (×3): 0.5 [in_us] via TOPICAL
  Filled 2013-09-19: qty 30

## 2013-09-19 MED ORDER — MULTI-VITAMIN/MINERALS PO TABS
1.0000 | ORAL_TABLET | Freq: Every day | ORAL | Status: DC
Start: 2013-09-19 — End: 2013-09-19

## 2013-09-19 MED ORDER — SODIUM CHLORIDE 0.9 % IV SOLN: 250.0000 mL | INTRAVENOUS | Status: DC | PRN

## 2013-09-19 MED ORDER — NITROGLYCERIN 0.4 MG SL SUBL
0.4000 mg | SUBLINGUAL_TABLET | SUBLINGUAL | Status: DC | PRN
  Administered 2013-09-19 – 2013-09-20 (×2): 0.4 mg via SUBLINGUAL
  Filled 2013-09-19: qty 25

## 2013-09-19 MED ORDER — NITROGLYCERIN 0.4 MG SL SUBL
0.4000 mg | SUBLINGUAL_TABLET | SUBLINGUAL | Status: DC | PRN
  Administered 2013-09-20: 05:00:00 0.4 mg via SUBLINGUAL
  Filled 2013-09-19: qty 25

## 2013-09-19 MED ORDER — ONDANSETRON HCL 4 MG/2ML IJ SOLN: 4.0000 mg | Freq: Four times a day (QID) | INTRAMUSCULAR | Status: DC | PRN

## 2013-09-19 MED ORDER — ENALAPRIL MALEATE 5 MG PO TABS
5.0000 mg | ORAL_TABLET | Freq: Every day | ORAL | Status: DC
  Administered 2013-09-20: 5 mg via ORAL
  Filled 2013-09-19: qty 1

## 2013-09-19 NOTE — Progress Notes (Signed)
Attempted report.  ED nurse to call unit back. Nino Glow RN

## 2013-09-19 NOTE — Progress Notes (Signed)
1845 Transferred from ER via streher  Able to ambulate with sready gait . Denied chest pain .Kept comfortable in bed . Significant other at bedside. CMT notified

## 2013-09-19 NOTE — Progress Notes (Signed)
ANTICOAGULATION CONSULT NOTE - Initial Consult  Pharmacy Consult for coumadin Indication: history of DVT  Allergies  Allergen Reactions  . Penicillins Hives and Shortness Of Breath  . Shellfish Allergy Shortness Of Breath  . Iodine Hives  . Iohexol      Desc: hives,throat,lip swelling kdean   . Milk-Related Compounds Diarrhea  . Vitamin K And Related Hives    Patient Measurements: Weight: 188 lb (85.276 kg)   Vital Signs: Temp: 97.9 F (36.6 C) (11/06 1327) BP: 124/67 mmHg (11/06 1612) Pulse Rate: 65 (11/06 1612)  Labs:  Recent Labs  09/19/13 1146 09/19/13 1410  HGB  --  15.5  HCT  --  43.0  PLT  --  115*  LABPROT 55.6* 53.5*  INR 6.77* 6.38*  CREATININE  --  1.41*    The CrCl is unknown because both a height and weight (above a minimum accepted value) are required for this calculation.   Medical History: Past Medical History  Diagnosis Date  . Chest pain     angina secondary to hypertrophic cardiomyopathy  . Diastolic congestive heart failure   . Hypertriglyceridemia   . Chronic renal insufficiency   . Ventricular tachycardia     hx  . Hypertrophic cardiomyopathy   . Coronary artery disease   . Myocardial infarction   . Polycythemia     JAK-2 negative on 02/08/2013; but still concern for PV due to lack of obvious cause for secondary polycythemia.      Assessment: 52 yo male here with CP on coumadin PTA for history of DVT (three months ago). Pharmacy has been consulted to dose coumadin. INR today is 6.28.  Home coumadin dose: 5mg  po bid (patient confirms bid dosing and has been on this dose for 2 weeks; prior dose was 7.5mg  daily).  Goal of Therapy:  INR 2-3 Monitor platelets by anticoagulation protocol: Yes   Plan:  -Hold coumadin -When ready for discharge, please consider changing coumadin to a once daily regimen only -Daily PT/INR  Harland German, Pharm D 09/19/2013 6:15 PM

## 2013-09-19 NOTE — ED Notes (Signed)
Patient transported to X-ray 

## 2013-09-19 NOTE — ED Notes (Signed)
Phlebotomy at bedside.

## 2013-09-19 NOTE — ED Notes (Signed)
Pt in c/o chest pain since this am, states he woke up this morning and didn't feel right, states the pain is to his left chest area, pt was dx with right leg DVT three months ago and is currently taking coumadin, pt c/o shortness of breath, denies other symptoms, pt alert and oriented, skin warm and dry
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
## 2013-09-19 NOTE — ED Notes (Signed)
MD at bedside. 

## 2013-09-19 NOTE — Progress Notes (Signed)
1830 Received tel report from [employer]
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
## 2013-09-19 NOTE — ED Notes (Addendum)
Per Zollie Beckers in the lab INR 6.38  Ptt 53.5

## 2013-09-19 NOTE — ED Notes (Signed)
Notified Theodore Demark PA ([facility]) of pt's pt & inr.

## 2013-09-19 NOTE — Telephone Encounter (Signed)
The lab called stating that his PT is 55.6 and INR is 6.77

## 2013-09-20 ENCOUNTER — Other Ambulatory Visit: Payer: Self-pay | Admitting: Physician Assistant

## 2013-09-20 ENCOUNTER — Encounter (HOSPITAL_COMMUNITY): Payer: Self-pay | Admitting: General Practice

## 2013-09-20 DIAGNOSIS — R0789 Other chest pain: Secondary | ICD-10-CM

## 2013-09-20 DIAGNOSIS — I517 Cardiomegaly: Secondary | ICD-10-CM

## 2013-09-20 DIAGNOSIS — R079 Chest pain, unspecified: Secondary | ICD-10-CM

## 2013-09-20 LAB — BASIC METABOLIC PANEL WITH GFR
BUN: 17 mg/dL (ref 6–23)
CO2: 23 meq/L (ref 19–32)
Calcium: 8.5 mg/dL (ref 8.4–10.5)
Chloride: 102 meq/L (ref 96–112)
Creatinine, Ser: 1.42 mg/dL — ABNORMAL HIGH (ref 0.50–1.35)
GFR calc Af Amer: 65 mL/min — ABNORMAL LOW
GFR calc non Af Amer: 56 mL/min — ABNORMAL LOW
Glucose, Bld: 96 mg/dL (ref 70–99)
Potassium: 4.2 meq/L (ref 3.5–5.1)
Sodium: 134 meq/L — ABNORMAL LOW (ref 135–145)

## 2013-09-20 LAB — TSH: TSH: 2.116 u[IU]/mL (ref 0.350–4.500)

## 2013-09-20 LAB — PROTIME-INR
INR: 6.1 (ref 0.00–1.49)
Prothrombin Time: 51.7 seconds — ABNORMAL HIGH (ref 11.6–15.2)

## 2013-09-20 MED ORDER — METOPROLOL SUCCINATE ER 50 MG PO TB24: 150.0000 mg | ORAL_TABLET | Freq: Every day | ORAL | Status: DC

## 2013-09-20 MED ORDER — TETANUS-DIPHTHERIA TOXOIDS TD 5-2 LFU IM INJ
0.5000 mL | INJECTION | INTRAMUSCULAR | Status: AC
  Administered 2013-09-20: 18:00:00 0.5 mL via INTRAMUSCULAR
  Filled 2013-09-20 (×2): qty 0.5

## 2013-09-20 MED ORDER — ENALAPRIL MALEATE 5 MG PO TABS: 5.0000 mg | ORAL_TABLET | Freq: Two times a day (BID) | ORAL | Status: DC

## 2013-09-20 NOTE — Progress Notes (Signed)
Pt given 2nd .4mg  nitro SL. Obtained relief. o sob noted. Continued to monitor pt closely

## 2013-09-20 NOTE — Progress Notes (Signed)
Patient's IV and telemetry has been discontinued, patient verbalizes understanding of discharge instructions and is being discharged home with girlfriend. Lorretta Harp RN

## 2013-09-20 NOTE — Progress Notes (Signed)
Pt c/o ch est pain 6/10 pain scale. ekg done. Nitro SL tab given. Continued to monitor pt.

## 2013-09-20 NOTE — Progress Notes (Signed)
UR COMPLETED  
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
## 2013-09-20 NOTE — Progress Notes (Signed)
*  PRELIMINARY RESULTS* Echocardiogram 2D Echocardiogram has been performed.  [name] Moreno 09/20/2013, 12:40 PM

## 2013-09-20 NOTE — Progress Notes (Signed)
CRITICAL VALUE ALERT  Critical value received:  INR=6.10 PT=51.7  Date of notification:  09/20/2013  Time of notification:  0700  Critical value read back:yes  Nurse who received alert:  R. Nixie Laube rn  MD notified (1st page):  Dunn PA [place] [facility]  Time of first page:  [phone]  MD notified (2nd page):  Time of second page:  Responding MD:  Nehemiah Settle  PA  Time MD responded:  [phone]

## 2013-09-20 NOTE — Progress Notes (Signed)
Patient: [name] Moreno Date of Encounter: 09/20/2013, 6:56 AM Admit date: 09/19/2013     Subjective  Mr. Macquarrie denies CP or SOB currently. Sleeping when entered room. Had an episode earlier this AM which was relieved with SL NTG x 2 per nursing.   Objective  Physical Exam: Vitals: BP 117/69  Pulse 72  Temp(Src) 97.4 F (36.3 C) (Oral)  Resp 18  Ht 5\' 1"  (1.549 m)  Wt 184 lb 1.6 oz (83.507 kg)  BMI 34.80 kg/m2  SpO2 97% General: Well developed, well appearing 52 year old male in no acute distress. Neck: Supple. +JVD. Lungs: Clear bilaterally to auscultation without wheezes, rales, or rhonchi. Breathing is unlabored. Heart: RRR S1 S2 with II/VI systolic murmur. No rub or gallop.  Abdomen: Soft, non-distended. Extremities: No clubbing or cyanosis. No edema.  Distal pedal pulses are 2+ and equal bilaterally. Neuro: Alert and oriented X 3. Moves all extremities spontaneously. No focal deficits.  Intake/Output:  Intake/Output Summary (Last 24 hours) at 09/20/13 0656 Last data filed at 09/20/13 0447  Gross per 24 hour  Intake      0 ml  Output    300 ml  Net   -300 ml    Inpatient Medications:  . enalapril  5 mg Oral Daily  . metoprolol succinate  150 mg Oral Daily  . multivitamin with minerals  1 tablet Oral Daily  . nitroGLYCERIN  0.5 inch Topical Q8H  . pantoprazole  40 mg Oral Daily  . sodium chloride  3 mL Intravenous Q12H  . verapamil  240 mg Oral Q breakfast    Labs:  Recent Labs  09/19/13 1410 09/20/13 0500  NA 136 134*  K 4.3 4.2  CL 103 102  CO2 22 23  GLUCOSE 80 96  BUN 16 17  CREATININE 1.41* 1.42*  CALCIUM 9.5 8.5    Recent Labs  09/19/13 1649  AST 53*  ALT 32  ALKPHOS 77  BILITOT 0.2*  PROT 7.0  ALBUMIN 3.6    Recent Labs  09/19/13 1410  WBC 4.7  HGB 15.5  HCT 43.0  MCV 95.1  PLT 115*    Recent Labs  09/19/13 1830 09/19/13 2300 09/20/13 0500  TROPONINI <0.30 <0.30 <0.30    Recent Labs  09/20/13 0500  INR 6.10*      Radiology/Studies: Dg Chest 2 View 09/19/2013     IMPRESSION: Stable postoperative chest. No acute cardiopulmonary process.    Electronically Signed   By: Roxy Horseman M.D.   On: 09/19/2013 14:53   Echocardiogram: pending Telemetry: SR; no arrhythmias   Assessment and Plan  1. Chest pain: Atypical for angina. Now pain-free. Think ischemia is unlikely. CEs negative. Could be related to LVOT obstruction from HOCM. He does not appear dehydrated. Would avoid IV fluid at this time. Will get echo to assess LVOT gradient. Continue Toprol XL and verapamil. Consider discontinuing ACEI as this can increase LVOT gradient and increase risk of symptoms.  2. HOCM: Patient is s/p myomectomy. Will get repeat echo to reassess gradient (would assess gradient with valsalva also). As above, cut back on enalapril as it can increase LVOT gradient. Will continue Toprol XL but increase to 150 mg daily. Continue Verapamil.  3. Chronic diastolic HF: Would be careful about diuresis until we assess his LVOT gradient. He uses Lasix very rarely (prn) at home. He has exertional fatigue chronically after walking about half a block.  4. CKD: will follow BMET 5. Supratherapeutic INR: will hold  Coumadin for now; appreciate Pharmacy assistance 6. RLE DVT, diagnosed August 2014  Dr. Ladona Ridgel to see.  Signed, Exie Parody  Cardiology Attending  Patient seen and examined. He is well known to me from prior clinic visits and hospitalizations. He has ruled out for MI and echo has been reviewed which demonstrates antero-septal hypokinesis. His labs demonstrate no evidence of an MI and his pain is now well controlled. I would like for him to undergo Steffanie Dunn as an outpatient and ok for discharge today. His INR was high and he will need to hold this for 2 days before restarting on Monday. He will need early followup with a mid-level after his outpatient stress test.  Leonia Reeves.D.

## 2013-09-20 NOTE — Discharge Summary (Signed)
CARDIOLOGY DISCHARGE SUMMARY   Patient ID: [name] Moreno MRN: [id number] DOB/AGE: [birth year] 51 y.o.  Admit date: 09/19/2013 Discharge date: 09/20/2013  Primary Discharge Diagnosis:     Chest pain, localized Secondary Discharge Diagnosis:    Essential hypertension, benign   DIASTOLIC HEART FAILURE, CHRONIC   H/O cardiac arrest   Status post myomectomy   CKD (chronic kidney disease) stage 3, GFR 30-59 ml/min  Procedures:  2-D echocardiogram  Hospital Course: [name] Moreno is a 52 y.o. male with a history of HOCM. On the day of admission, he had some chest pain that started with minimal activity. He was also having some shortness of breath with exertion. He came to the emergency room and was given sublingual nitroglycerin x2 with resolution of his chest pain. He was admitted for further evaluation and treatment.  His ECG was nonacute but he has an underlying left bundle branch block. His cardiac enzymes were negative for MI. His chest x-ray did not show any acute CHF. His creatinine was slightly elevated but he is known to have chronic kidney disease. An echocardiogram was performed which showed an EF of 45-50% and severe septal hypokinesis, results are below. Velocities were described as abnormal but not defined, previously 25 mm Hg.  His INR was elevated on admission. He had previously had his INR checked that day at his family physician's office if you have was high. He had been told to hold his Coumadin for 2 days and has a followup appointment in the near future. At discharge, his INR was 6.1. He was told to hold for 1 more day as previously requested, then decrease his coumadin to 5 mg daily for the next 3 days, then follow up with primary MD.  He had no recurrence of chest pain but on 09/20/2013, he had some back pain. He had localized muscle tenderness and spasm. He will be treated symptomatically for this no further inpatient workup is indicated.  On 09/20/2013, he was seen  by Dr. Ladona Ridgel. His condition had improved and he was ambulating without chest pain. His shortness of breath was at baseline. He was considered stable for discharge, in improved condition, to follow up as an outpatient with a stress test and an office visit.   Labs:  Lab Results  Component Value Date   WBC 4.7 09/19/2013   HGB 15.5 09/19/2013   HCT 43.0 09/19/2013   MCV 95.1 09/19/2013   PLT 115* 09/19/2013     Recent Labs Lab 09/19/13 1649 09/20/13 0500  NA  --  134*  K  --  4.2  CL  --  102  CO2  --  23  BUN  --  17  CREATININE  --  1.42*  CALCIUM  --  8.5  PROT 7.0  --   BILITOT 0.2*  --   ALKPHOS 77  --   ALT 32  --   AST 53*  --   GLUCOSE  --  96    Recent Labs  09/19/13 2300 09/20/13 0500 09/20/13 1100  TROPONINI <0.30 <0.30 <0.30   Pro B Natriuretic peptide (BNP)  Date/Time Value Range Status  09/19/2013  2:12 PM 518.2* 0 - 125 pg/mL Final  05/26/2013  8:34 PM 514.6* 0 - 125 pg/mL Final    Recent Labs  09/20/13 0500  INR 6.10*   Radiology: Dg Chest 2 View 09/19/2013   CLINICAL DATA:  Chest pain.  EXAM: CHEST  2 VIEW  COMPARISON:  07/17/2013.  FINDINGS: The right subclavian AICD appears unchanged at the right ventricular apex. The heart size and mediastinal contours are stable status post median sternotomy. Old epicardial pacing wires are in place. The lungs are clear. There is no pleural effusion or pneumothorax. No acute osseous findings are seen.  IMPRESSION: Stable postoperative chest. No acute cardiopulmonary process.   Electronically Signed   By: Roxy Horseman M.D.   On: 09/19/2013 14:53   EKG: 09/19/2013 Sinus rhythm with left bundle branch block that is old Vent. rate 75 BPM PR interval 172 ms QRS duration 176 ms QT/QTc 452/504 ms P-R-T axes 69 -58 118  Echo: 09/20/2013 Conclusions - Left ventricle: The cavity size was normal. Wall thickness was increased in a pattern of severe LVH. Systolic function was mildly reduced. The estimated  ejection fraction was in the range of 45% to 50%. Severe hypokinesis of the anteroseptal myocardium. - Mitral valve: Mild regurgitation. - Left atrium: The atrium was mildly to moderately dilated.  FOLLOW UP PLANS AND APPOINTMENTS Allergies  Allergen Reactions  . Penicillins Hives and Shortness Of Breath  . Shellfish Allergy Shortness Of Breath  . Iodine Hives  . Iohexol      Desc: hives,throat,lip swelling kdean   . Milk-Related Compounds Diarrhea  . Vitamin K And Related Hives     Medication List         enalapril 5 MG tablet  Commonly known as:  VASOTEC  Take 1 tablet (5 mg total) by mouth 2 (two) times daily.     furosemide 40 MG tablet  Commonly known as:  LASIX  Take 40 mg by mouth daily as needed for fluid.     loperamide 2 MG capsule  Commonly known as:  IMODIUM  Take 12-16 mg by mouth daily as needed for diarrhea or loose stools.     meclizine 25 MG tablet  Commonly known as:  ANTIVERT  Take 25 mg by mouth 3 (three) times daily as needed for dizziness.     metoprolol succinate 50 MG 24 hr tablet  Commonly known as:  TOPROL-XL  Take 3 tablets (150 mg total) by mouth daily.     multivitamin with minerals tablet  Take 1 tablet by mouth daily.     pantoprazole 40 MG tablet  Commonly known as:  PROTONIX  Take 40 mg by mouth daily.     potassium chloride SA 20 MEQ tablet  Commonly known as:  K-DUR,KLOR-CON  Take 20 mEq by mouth daily as needed (taken with Lasix).     verapamil 240 MG CR tablet  Commonly known as:  CALAN-SR  Take 240 mg by mouth every morning.     warfarin 5 MG tablet  Commonly known as:  COUMADIN  Take 5 mg by mouth 2 (two) times daily.        Discharge Orders   Future Appointments Provider Department Dept Phone   09/24/2013 1:00 PM [facility] Nuclear 2 [place] Landry Treadm) [facility] SITE 3 NUCLEAR MED [phone]   09/30/2013 11:45 AM [facility] Device Remotes [facility] [place] Office [phone]   10/01/2013  11:15 AM Pfsm-Pfsm Nurse [facility] Family Medicine [phone]   10/01/2013 2:30 PM Herby Abraham Beverly Sessions [facility] [place] Office [phone]   10/04/2013 10:30 AM Mauri [place] [facility] MEDICAL ONCOLOGY [phone]   10/04/2013 11:00 AM [facility] Covering Provider 1 [place] [facility] MED[facility]OGY [phone]   10/04/2013 12:00 PM [facility] E[address] [place] [facility] MED[facility] ON[facility] [phone]  11/01/2013 2:30 PM Mauri [place] MEDICAL ONCOLOG[facility] [phone]   11/01/2013 3:00 PM [facility] G[facility] [place] [facility] MED[facility] ONCOL[facility]Y [phone]   Future Orders Complete By Expires   (HEART FAILURE PATIENTS) Call MD:  Anytime you have any of the following symptoms: 1) 3 pound weight gain in 24 hours or 5 pounds in 1 week 2) shortness of breath, with or without a dry hacking cough 3) swelling in the hands, feet or stomach 4) if you have to sleep on extra pillows at night in order to breathe.  As directed    Diet - low sodium heart healthy  As directed    Increase activity slowly  As directed      Follow-up Information   Follow up with EDMISTEN, BROOKE, PA-C On 10/01/2013. (at 2:30 pm)    Specialty:  Cardiology   Contact information:   [address] [place] [address] [phone]       Follow up with [facility] [address] On 09/24/2013. (at 1:00 pm)    Contact information:   [address] [place] [address]       BRING ALL MEDICATIONS WITH YOU TO FOLLOW UP APPOINTMENTS  Time spent with patient to include physician time: 31 min Signed: Theodore Demark, PA-C 09/20/2013, 4:11 PM Co-Sign MD  Cardiology Attending  Agree with above.  Leonia Reeves.D.

## 2013-09-20 NOTE — Progress Notes (Signed)
Subjective: Cardiology team called in regards to patient having back pain. Upon arrival to the room, the patient was sitting up in his chair, in no apparent distress. He complains of left flank pain, 8/10, that started this morning which he attributed to sleeping in a "funny position" . His chest pain has completely resolved since last last night.  He denies fever/chills, lightheadedness, dizziness, chest pain, heart palpitations, shortness of breath, abdominal pain, or dysuria.   Objective:  Vitals: T 97.9 F, RR 18, HR 66, BP 110/67, SpO2 96% on RA.  Gen: Sitting up in chair, in no apparent distress.  Neck: Supple, JVD+, no bruits.  Lung: CTAB, unlabored respirations.  Heart: RRR, S1 and S2, with 2/6 systolic murmur. No rubs or gallop.  Abd: Soft, Non tender, non distended, BS+.  GU: No CVA tenderness.  MSK: Tender to palpation at the left lower flank with palpable muscle spasms.  Extremities: No edema. DP, PT, and radial pulses 2+ equally and bilaterally.  Neuro: Alert and Oriented x3. No focal deficits. Sensation intact in LE.   Assessment and Plan: 1. Chest pain: Now resolved. Please see rounding team's note for further details of the assessment and plan. Repeat EKG.  2. MSK back pain: Localized muscle tenderness with spasm. Tylenol PRN for pain, warm and cold compresses PRN for relief of muscle tightness and spasm.    Dr. Ladona Ridgel to see.   Signed,   [name] Barban, MD 09/20/13, 1205 PM [facility] Internal [facility] PGY-II resident, pager [phone]

## 2013-09-23 NOTE — ED Provider Notes (Signed)
I saw and evaluated the patient, reviewed the resident's note and I agree with the findings and plan.  EKG Interpretation     Ventricular Rate:  75 PR Interval:  172 QRS Duration: 176 QT Interval:  452 QTC Calculation: 504 R Axis:   -58 Text Interpretation:  Normal sinus rhythm Right atrial enlargement Left axis deviation Left bundle branch block Abnormal ECG No significant changes since last tracing           Patient here with chest pain. Hx of HOCM and myomectomy. Has been admitted by [facility] for chest pain previously. L sided chest pain, nonradiating, no exacerbating/alleviating factors. EKG similar to prior to LBBB. Lungs clear, belly soft, no signs of volume overload. Pain improved with nitroglycerin, cardiology consulted.    Dagmar Hait, MD 09/23/13 [phone]

## 2013-09-24 ENCOUNTER — Ambulatory Visit (HOSPITAL_COMMUNITY): Payer: MEDICARE | Attending: Cardiology | Admitting: Radiology

## 2013-09-24 ENCOUNTER — Encounter: Payer: Self-pay | Admitting: Family Medicine

## 2013-09-24 VITALS — BP 118/80 | HR 74 | Ht 61.0 in | Wt 183.0 lb

## 2013-09-24 DIAGNOSIS — Z95810 Presence of automatic (implantable) cardiac defibrillator: Secondary | ICD-10-CM | POA: Insufficient documentation

## 2013-09-24 DIAGNOSIS — I10 Essential (primary) hypertension: Secondary | ICD-10-CM | POA: Insufficient documentation

## 2013-09-24 DIAGNOSIS — R0609 Other forms of dyspnea: Secondary | ICD-10-CM | POA: Insufficient documentation

## 2013-09-24 DIAGNOSIS — R0602 Shortness of breath: Secondary | ICD-10-CM | POA: Insufficient documentation

## 2013-09-24 DIAGNOSIS — R42 Dizziness and giddiness: Secondary | ICD-10-CM | POA: Insufficient documentation

## 2013-09-24 DIAGNOSIS — I2510 Atherosclerotic heart disease of native coronary artery without angina pectoris: Secondary | ICD-10-CM

## 2013-09-24 DIAGNOSIS — I252 Old myocardial infarction: Secondary | ICD-10-CM | POA: Insufficient documentation

## 2013-09-24 DIAGNOSIS — Z8249 Family history of ischemic heart disease and other diseases of the circulatory system: Secondary | ICD-10-CM | POA: Insufficient documentation

## 2013-09-24 DIAGNOSIS — R0789 Other chest pain: Secondary | ICD-10-CM | POA: Insufficient documentation

## 2013-09-24 DIAGNOSIS — R0989 Other specified symptoms and signs involving the circulatory and respiratory systems: Secondary | ICD-10-CM | POA: Insufficient documentation

## 2013-09-24 DIAGNOSIS — R079 Chest pain, unspecified: Secondary | ICD-10-CM

## 2013-09-24 DIAGNOSIS — E785 Hyperlipidemia, unspecified: Secondary | ICD-10-CM | POA: Insufficient documentation

## 2013-09-24 DIAGNOSIS — I447 Left bundle-branch block, unspecified: Secondary | ICD-10-CM | POA: Insufficient documentation

## 2013-09-24 MED ORDER — TECHNETIUM TC 99M SESTAMIBI GENERIC - CARDIOLITE
33.0000 | Freq: Once | INTRAVENOUS | Status: AC | PRN
  Administered 2013-09-24: 33 via INTRAVENOUS

## 2013-09-24 MED ORDER — TECHNETIUM TC 99M SESTAMIBI GENERIC - CARDIOLITE
11.0000 | Freq: Once | INTRAVENOUS | Status: AC | PRN
  Administered 2013-09-24: 11 via INTRAVENOUS

## 2013-09-24 MED ORDER — REGADENOSON 0.4 MG/5ML IV SOLN
0.4000 mg | Freq: Once | INTRAVENOUS | Status: AC
  Administered 2013-09-24: 0.4 mg via INTRAVENOUS

## 2013-09-24 NOTE — Progress Notes (Signed)
[facility] SITE 3 NUCLEAR MED [address] [place], [place] [address] [phone]    Cardiology Nuclear Med Study  [name] Moreno is a 52 y.o. male     MRN : [id number]     DOB: 25-Aug-1961  Procedure Date: 09/24/2013  Nuclear Med Background Indication for Stress Test:  Evaluation for Ischemia and Patient seen in hospital on 09/19/13 with chest pain and SOB; enzymes were negative History:  MI; Normal Cath; AICD; 09/20/13 Echo:EF=50% Cardiac Risk Factors: Family History - CAD, Hypertension, LBBB and Lipids  Symptoms:  Chest Pressure.  (last episode of chest discomfort:none since discharge), Dizziness, DOE and SOB   Nuclear Pre-Procedure Caffeine/Decaff Intake:  None > 12 hrs NPO After: 8:30am   Lungs:  Clear. O2 Sat: 97% on room air. IV 0.9% NS with Angio Cath:  20g  IV Site: R Antecubital x 1, tolerated well IV Started by:  Irean Hong, RN  Chest Size (in):  44 Cup Size: n/a  Height: 5\' 1"  (1.549 m)  Weight:  183 lb (83.008 kg)  BMI:  Body mass index is 34.6 kg/(m^2). Tech Comments:  Took Verapamil, Enalapril, and Toprol this am.    Nuclear Med Study 1 or 2 day study: 1 day  Stress Test Type:  Lexiscan  Reading MD: Olga Millers, MD  Order Authorizing Provider:  Marca Ancona, MD  Resting Radionuclide: Technetium 31m Sestamibi  Resting Radionuclide Dose: 11.0 mCi   Stress Radionuclide:  Technetium 70m Sestamibi  Stress Radionuclide Dose: 33.0 mCi           Stress Protocol Rest HR: 74 Stress HR: 94  Rest BP: 118/80 Stress BP: 130/61  Exercise Time (min): n/a METS: n/a   Predicted Max HR: 169 bpm % Max HR: 55.62 bpm Rate Pressure Product: 46962   Dose of Adenosine (mg):  n/a Dose of Lexiscan: 0.4 mg  Dose of Atropine (mg): n/a Dose of Dobutamine: n/a mcg/kg/min (at max HR)  Stress Test Technologist: Smiley Houseman, CMA-N  Nuclear Technologist:  Doyne Keel, CNMT     Rest Procedure:  Myocardial perfusion imaging was performed at rest 45 minutes  following the intravenous administration of Technetium 61m Sestamibi.  Rest ECG: NSR-RAE, LBBB  Stress Procedure:  The patient received IV Lexiscan 0.4 mg over 15-seconds.  Technetium 64m Sestamibi injected at 30-seconds.  He c/o chest tightness with Lexiscan.  Quantitative spect images were obtained after a 45 minute delay.  Stress ECG: Uninteretable due to baseline LBBB  QPS Raw Data Images:  Acquisition technically good; LVE. Stress Images:  There is decreased uptake in the inferior wall. Rest Images:  Normal homogeneous uptake in all areas of the myocardium. Subtraction (SDS):  These findings are consistent with ischemia. Transient Ischemic Dilatation (Normal <1.22):  1.13 Lung/Heart Ratio (Normal <0.45):  0.28  Quantitative Gated Spect Images QGS EDV:  200 ml QGS ESV:  127 ml  Impression Exercise Capacity:  Lexiscan with no exercise. BP Response:  Normal blood pressure response. Clinical Symptoms:  There is chest tightness and dyspnea. ECG Impression:  Baseline:  LBBB.  EKG uninterpretable due to LBBB at rest and stress. Comparison with Prior Nuclear Study: No previous nuclear study performed  Overall Impression:  Low risk stress nuclear study with a small, moderate intensity, reversible defect in the basal inferior wall consistent with mild ischemia.  LV Ejection Fraction: 36%.  LV Wall Motion:  Inferior hypokinesis; LV function visually appears better than calculated EF; suggest echocardiogram to better quantify.  Olga Millers

## 2013-09-25 ENCOUNTER — Other Ambulatory Visit: Payer: BC Managed Care – PPO

## 2013-09-26 ENCOUNTER — Ambulatory Visit (INDEPENDENT_AMBULATORY_CARE_PROVIDER_SITE_OTHER): Payer: BC Managed Care – PPO | Admitting: Family Medicine

## 2013-09-26 ENCOUNTER — Encounter: Payer: Self-pay | Admitting: Family Medicine

## 2013-09-26 VITALS — BP 122/78 | HR 72 | Wt 184.0 lb

## 2013-09-26 DIAGNOSIS — I82401 Acute embolism and thrombosis of unspecified deep veins of right lower extremity: Secondary | ICD-10-CM

## 2013-09-26 DIAGNOSIS — Z7901 Long term (current) use of anticoagulants: Secondary | ICD-10-CM

## 2013-09-26 DIAGNOSIS — I82409 Acute embolism and thrombosis of unspecified deep veins of unspecified lower extremity: Secondary | ICD-10-CM

## 2013-09-26 MED ORDER — APIXABAN 5 MG PO TABS: 5.0000 mg | ORAL_TABLET | Freq: Two times a day (BID) | ORAL | Status: DC

## 2013-09-26 NOTE — Patient Instructions (Signed)
Stop the coumadin

## 2013-09-27 ENCOUNTER — Encounter: Payer: Self-pay | Admitting: Family Medicine

## 2013-09-27 NOTE — Progress Notes (Signed)
  Subjective:    Patient ID: [name] Moreno, male    DOB: [birth year], 52 y.o.   MRN: [id number]  HPI He was seen recently in the emergency room and evaluated for chest pain. That record was reviewed. He does complain of some left leg discomfort but points to the lateral aspect of his calf. He has no more chest pain, shortness of breath, DOE. He did recently have an echocardiogram which showed an EF of 36%.   Review of Systems     Objective:   Physical Exam Alert and in no distress. Cardiac exam shows regular rhythm without murmurs or gallops. Lungs are clear to auscultation. Lower extremity exam shows negative Homans sign.       Assessment & Plan:  Long term (current) use of anticoagulants  DVT (deep venous thrombosis), right - Plan: apixaban (ELIQUIS) 5 MG TABS tablet  he also has underlying diastolic dysfunction. He will followup with cardiology concerning this. Have decided to switch him to Eliquis since he was having difficulty with his PT/INRs fluctuating. He was comfortable with this. Discussed possible side effects of medication. He will return here for followup in about one month.

## 2013-09-28 ENCOUNTER — Emergency Department (HOSPITAL_COMMUNITY)
Admission: EM | Admit: 2013-09-28 | Discharge: 2013-09-28 | Disposition: A | Discharge status: Home / Self Care | Payer: MEDICARE | Attending: Emergency Medicine | Admitting: Emergency Medicine

## 2013-09-28 ENCOUNTER — Encounter (HOSPITAL_COMMUNITY): Payer: Self-pay | Admitting: Emergency Medicine

## 2013-09-28 ENCOUNTER — Emergency Department (HOSPITAL_COMMUNITY): Payer: MEDICARE

## 2013-09-28 DIAGNOSIS — M79604 Pain in right leg: Secondary | ICD-10-CM

## 2013-09-28 DIAGNOSIS — I82401 Acute embolism and thrombosis of unspecified deep veins of right lower extremity: Secondary | ICD-10-CM

## 2013-09-28 DIAGNOSIS — Z8701 Personal history of pneumonia (recurrent): Secondary | ICD-10-CM | POA: Insufficient documentation

## 2013-09-28 DIAGNOSIS — N183 Chronic kidney disease, stage 3 unspecified: Secondary | ICD-10-CM | POA: Insufficient documentation

## 2013-09-28 DIAGNOSIS — Z95810 Presence of automatic (implantable) cardiac defibrillator: Secondary | ICD-10-CM | POA: Insufficient documentation

## 2013-09-28 DIAGNOSIS — I252 Old myocardial infarction: Secondary | ICD-10-CM | POA: Insufficient documentation

## 2013-09-28 DIAGNOSIS — Z880 Allergy status to penicillin: Secondary | ICD-10-CM | POA: Insufficient documentation

## 2013-09-28 DIAGNOSIS — I5030 Unspecified diastolic (congestive) heart failure: Secondary | ICD-10-CM | POA: Insufficient documentation

## 2013-09-28 DIAGNOSIS — Z8639 Personal history of other endocrine, nutritional and metabolic disease: Secondary | ICD-10-CM | POA: Insufficient documentation

## 2013-09-28 DIAGNOSIS — Z7901 Long term (current) use of anticoagulants: Secondary | ICD-10-CM | POA: Insufficient documentation

## 2013-09-28 DIAGNOSIS — Z862 Personal history of diseases of the blood and blood-forming organs and certain disorders involving the immune mechanism: Secondary | ICD-10-CM | POA: Insufficient documentation

## 2013-09-28 DIAGNOSIS — I82409 Acute embolism and thrombosis of unspecified deep veins of unspecified lower extremity: Secondary | ICD-10-CM | POA: Insufficient documentation

## 2013-09-28 DIAGNOSIS — Z79899 Other long term (current) drug therapy: Secondary | ICD-10-CM | POA: Insufficient documentation

## 2013-09-28 DIAGNOSIS — I2510 Atherosclerotic heart disease of native coronary artery without angina pectoris: Secondary | ICD-10-CM | POA: Insufficient documentation

## 2013-09-28 DIAGNOSIS — K219 Gastro-esophageal reflux disease without esophagitis: Secondary | ICD-10-CM | POA: Insufficient documentation

## 2013-09-28 DIAGNOSIS — R0602 Shortness of breath: Secondary | ICD-10-CM | POA: Insufficient documentation

## 2013-09-28 DIAGNOSIS — M545 Low back pain, unspecified: Secondary | ICD-10-CM | POA: Insufficient documentation

## 2013-09-28 DIAGNOSIS — R791 Abnormal coagulation profile: Secondary | ICD-10-CM | POA: Insufficient documentation

## 2013-09-28 DIAGNOSIS — G43909 Migraine, unspecified, not intractable, without status migrainosus: Secondary | ICD-10-CM | POA: Insufficient documentation

## 2013-09-28 DIAGNOSIS — Z8619 Personal history of other infectious and parasitic diseases: Secondary | ICD-10-CM | POA: Insufficient documentation

## 2013-09-28 DIAGNOSIS — R011 Cardiac murmur, unspecified: Secondary | ICD-10-CM | POA: Insufficient documentation

## 2013-09-28 LAB — URINALYSIS, ROUTINE W REFLEX MICROSCOPIC
Leukocytes, UA: NEGATIVE
Nitrite: NEGATIVE
Specific Gravity, Urine: 1.015 (ref 1.005–1.030)
Urobilinogen, UA: 0.2 mg/dL (ref 0.0–1.0)
pH: 5.5 (ref 5.0–8.0)

## 2013-09-28 LAB — COMPREHENSIVE METABOLIC PANEL
ALT: 5 U/L (ref 0–53)
AST: 5 U/L (ref 0–37)
Albumin: 3.4 g/dL — ABNORMAL LOW (ref 3.5–5.2)
Alkaline Phosphatase: 68 U/L (ref 39–117)
CO2: 21 mEq/L (ref 19–32)
Calcium: 9.3 mg/dL (ref 8.4–10.5)
Chloride: 102 mEq/L (ref 96–112)
GFR calc non Af Amer: 55 mL/min — ABNORMAL LOW (ref >90)
Potassium: 4.4 mEq/L (ref 3.5–5.1)
Total Protein: 6.5 g/dL (ref 6.0–8.3)

## 2013-09-28 LAB — CBC WITH DIFFERENTIAL/PLATELET
Eosinophils Absolute: 0.1 10*3/uL (ref 0.0–0.7)
HCT: 39.7 % (ref 39.0–52.0)
Hemoglobin: 14.6 g/dL (ref 13.0–17.0)
Lymphocytes Relative: 25 % (ref 12–46)
Lymphs Abs: 1.2 10*3/uL (ref 0.7–4.0)
MCH: 34.4 pg — ABNORMAL HIGH (ref 26.0–34.0)
MCHC: 36.8 g/dL — ABNORMAL HIGH (ref 30.0–36.0)
MCV: 93.6 fL (ref 78.0–100.0)
Monocytes Absolute: 0.3 10*3/uL (ref 0.1–1.0)
Monocytes Relative: 7 % (ref 3–12)
Neutro Abs: 3.3 10*3/uL (ref 1.7–7.7)
Neutrophils Relative %: 66 % (ref 43–77)
RBC: 4.24 MIL/uL (ref 4.22–5.81)

## 2013-09-28 LAB — POCT I-STAT TROPONIN I

## 2013-09-28 LAB — PRO B NATRIURETIC PEPTIDE: Pro B Natriuretic peptide (BNP): 1131 pg/mL — ABNORMAL HIGH (ref 0–125)

## 2013-09-28 LAB — URINE MICROSCOPIC-ADD ON

## 2013-09-28 MED ORDER — ONDANSETRON HCL 4 MG/2ML IJ SOLN
4.0000 mg | Freq: Once | INTRAMUSCULAR | Status: AC
  Administered 2013-09-28: 4 mg via INTRAVENOUS
  Filled 2013-09-28: qty 2

## 2013-09-28 MED ORDER — HYDROCODONE-ACETAMINOPHEN 5-325 MG PO TABS: 1.0000 | ORAL_TABLET | ORAL | Status: DC | PRN

## 2013-09-28 MED ORDER — MORPHINE SULFATE 4 MG/ML IJ SOLN
4.0000 mg | Freq: Once | INTRAMUSCULAR | Status: AC
  Administered 2013-09-28: 4 mg via INTRAVENOUS
  Filled 2013-09-28: qty 1

## 2013-09-28 NOTE — ED Notes (Signed)
Pt dx having blood clot in  End of august, last week pt had been hospitalized for chest pain, at [place], sona gram,  Pt discharge went to dr office and workup show that he a drop in ejection fraction, pt currently here for leg pain and fatique.

## 2013-09-28 NOTE — ED Notes (Signed)
Patient transported to X-ray 

## 2013-09-28 NOTE — ED Notes (Signed)
Patient with a hx having a blood clot 2months ago, pt presents having 24 hrs of having fatique , no c/os of having chest pain
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
## 2013-09-28 NOTE — ED Provider Notes (Signed)
Medical screening examination/treatment/procedure(s) were performed by non-physician practitioner and as supervising physician I was immediately available for consultation/collaboration.  EKG Interpretation     Ventricular Rate:  72 PR Interval:  171 QRS Duration: 178 QT Interval:  492 QTC Calculation: 538 R Axis:   -70 Text Interpretation:  Sinus rhythm Left axis deviation Probable left atrial enlargement Left bundle branch block             Olivia Mackie, MD 09/28/13 [phone]

## 2013-09-30 ENCOUNTER — Encounter: Payer: MEDICARE | Admitting: *Deleted

## 2013-10-01 ENCOUNTER — Ambulatory Visit (INDEPENDENT_AMBULATORY_CARE_PROVIDER_SITE_OTHER): Payer: BC Managed Care – PPO | Admitting: Cardiology

## 2013-10-01 ENCOUNTER — Telehealth: Payer: Self-pay

## 2013-10-01 ENCOUNTER — Other Ambulatory Visit: Payer: BC Managed Care – PPO

## 2013-10-01 ENCOUNTER — Encounter: Payer: Self-pay | Admitting: Cardiology

## 2013-10-01 VITALS — BP 122/86 | HR 67 | Ht 61.0 in | Wt 185.0 lb

## 2013-10-01 DIAGNOSIS — Z9889 Other specified postprocedural states: Secondary | ICD-10-CM

## 2013-10-01 DIAGNOSIS — Z8679 Personal history of other diseases of the circulatory system: Secondary | ICD-10-CM

## 2013-10-01 DIAGNOSIS — I421 Obstructive hypertrophic cardiomyopathy: Secondary | ICD-10-CM

## 2013-10-01 DIAGNOSIS — R079 Chest pain, unspecified: Secondary | ICD-10-CM

## 2013-10-01 DIAGNOSIS — Z95810 Presence of automatic (implantable) cardiac defibrillator: Secondary | ICD-10-CM

## 2013-10-01 DIAGNOSIS — Z79899 Other long term (current) drug therapy: Secondary | ICD-10-CM

## 2013-10-01 LAB — PROTIME-INR
INR: 3.63 — ABNORMAL HIGH (ref <1.50)
Prothrombin Time: 34.5 seconds — ABNORMAL HIGH (ref 11.6–15.2)

## 2013-10-01 NOTE — Telephone Encounter (Signed)
PT STARTED ELQUES ON Sunday NO LONGER ON COUMADIN PT CAME IN FOR PT/INR BECAUSE IT WAS ALREADY SCHEDULED FOR HIM TO HAVE IT DONE BEFORE HIS MED WAS CHANGED
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
## 2013-10-01 NOTE — Patient Instructions (Signed)
Your physician recommends that you schedule a follow-up appointment in: [place] January 2015 WITH DR. Ladona Ridgel  Your physician recommends that you continue on your current medications as directed. Please refer to the Current Medication list given to you today.

## 2013-10-04 ENCOUNTER — Other Ambulatory Visit (HOSPITAL_BASED_OUTPATIENT_CLINIC_OR_DEPARTMENT_OTHER): Payer: BC Managed Care – PPO | Admitting: Lab

## 2013-10-04 ENCOUNTER — Ambulatory Visit (HOSPITAL_BASED_OUTPATIENT_CLINIC_OR_DEPARTMENT_OTHER): Payer: BC Managed Care – PPO

## 2013-10-04 ENCOUNTER — Ambulatory Visit (HOSPITAL_BASED_OUTPATIENT_CLINIC_OR_DEPARTMENT_OTHER): Payer: BC Managed Care – PPO | Admitting: Internal Medicine

## 2013-10-04 ENCOUNTER — Encounter: Payer: Self-pay | Admitting: Internal Medicine

## 2013-10-04 ENCOUNTER — Telehealth: Payer: Self-pay | Admitting: Internal Medicine

## 2013-10-04 VITALS — BP 124/78 | HR 75 | Temp 97.9°F | Resp 18 | Ht 61.0 in | Wt 184.9 lb

## 2013-10-04 DIAGNOSIS — D45 Polycythemia vera: Secondary | ICD-10-CM

## 2013-10-04 DIAGNOSIS — I82401 Acute embolism and thrombosis of unspecified deep veins of right lower extremity: Secondary | ICD-10-CM

## 2013-10-04 DIAGNOSIS — Z905 Acquired absence of kidney: Secondary | ICD-10-CM

## 2013-10-04 DIAGNOSIS — Z9889 Other specified postprocedural states: Secondary | ICD-10-CM

## 2013-10-04 DIAGNOSIS — Z7901 Long term (current) use of anticoagulants: Secondary | ICD-10-CM

## 2013-10-04 DIAGNOSIS — I82409 Acute embolism and thrombosis of unspecified deep veins of unspecified lower extremity: Secondary | ICD-10-CM

## 2013-10-04 DIAGNOSIS — D696 Thrombocytopenia, unspecified: Secondary | ICD-10-CM

## 2013-10-04 LAB — MDC_IDC_ENUM_SESS_TYPE_INCLINIC
Battery Voltage: 3.13 V
Brady Statistic RV Percent Paced: 0.01 %
Date Time Interrogation Session: 20141118222939
HighPow Impedance: 19 Ohm
HighPow Impedance: 380 Ohm
Lead Channel Impedance Value: 380 Ohm
Lead Channel Pacing Threshold Amplitude: 1.25 V
Lead Channel Pacing Threshold Amplitude: 1.25 V
Lead Channel Pacing Threshold Pulse Width: 0.4 ms
Lead Channel Sensing Intrinsic Amplitude: 17.3 mV
Lead Channel Setting Pacing Amplitude: 2.5 V
Lead Channel Setting Sensing Sensitivity: 0.3 mV
Zone Setting Detection Interval: 350 ms
Zone Setting Detection Interval: 360 ms

## 2013-10-04 LAB — CBC WITH DIFFERENTIAL/PLATELET
EOS%: 2.5 % (ref 0.0–7.0)
Eosinophils Absolute: 0.1 10*3/uL (ref 0.0–0.5)
HCT: 45.6 % (ref 38.4–49.9)
LYMPH%: 24 % (ref 14.0–49.0)
MCHC: 34.2 g/dL (ref 32.0–36.0)
MONO#: 0.4 10*3/uL (ref 0.1–0.9)
NEUT#: 2.9 10*3/uL (ref 1.5–6.5)
NEUT%: 65 % (ref 39.0–75.0)
Platelets: 102 10*3/uL — ABNORMAL LOW (ref 140–400)
RBC: 4.81 10*6/uL (ref 4.20–5.82)
RDW: 12.4 % (ref 11.0–14.6)
WBC: 4.5 10*3/uL (ref 4.0–10.3)
lymph#: 1.1 10*3/uL (ref 0.9–3.3)

## 2013-10-04 LAB — COMPREHENSIVE METABOLIC PANEL (CC13)
ALT: 28 U/L (ref 0–55)
Anion Gap: 11 mEq/L (ref 3–11)
CO2: 24 mEq/L (ref 22–29)
Calcium: 9.5 mg/dL (ref 8.4–10.4)
Chloride: 102 mEq/L (ref 98–109)
Glucose: 120 mg/dl (ref 70–140)
Sodium: 137 mEq/L (ref 136–145)
Total Bilirubin: 0.51 mg/dL (ref 0.20–1.20)
Total Protein: 7.3 g/dL (ref 6.4–8.3)

## 2013-10-04 LAB — LACTATE DEHYDROGENASE (CC13): LDH: 277 U/L — ABNORMAL HIGH (ref 125–245)

## 2013-10-04 NOTE — Telephone Encounter (Signed)
GAve pt appt for lab for 12/5 emailed Marcelino Duster to add phlebotomy to this day, appt for 12/23 changed to 12/19 due to md PAL, appt is on 12/19 lab,md and phlebotomy

## 2013-10-04 NOTE — Patient Instructions (Addendum)
Therapeutic Phlebotomy Therapeutic phlebotomy is the controlled removal of blood from your body for the purpose of treating a medical condition. It is similar to donating blood. Usually, about a pint (470 mL) of blood is removed. The average adult has 9 to 12 pints (4.3 to 5.7 L) of blood. Therapeutic phlebotomy may be used to treat the following medical conditions:  Hemochromatosis. This is a condition in which there is too much iron in the blood.  Polycythemia vera. This is a condition in which there are too many red cells in the blood.  Porphyria cutanea tarda. This is a disease usually passed from one generation to the next (inherited). It is a condition in which an important part of hemoglobin is not made properly. This results in the build up of abnormal amounts of porphyrins in the body.  Sickle cell disease. This is an inherited disease. It is a condition in which the red blood cells form an abnormal crescent shape rather than a round shape. LET YOUR CAREGIVER KNOW ABOUT:  Allergies.  Medicines taken including herbs, eyedrops, over-the-counter medicines, and creams.  Use of steroids (by mouth or creams).  Previous problems with anesthetics or numbing medicine.  History of blood clots.  History of bleeding or blood problems.  Previous surgery.  Possibility of pregnancy, if this applies. RISKS AND COMPLICATIONS This is a simple and safe procedure. Problems are unlikely. However, problems can occur and may include:  Nausea or lightheadedness.  Low blood pressure.  Soreness, bleeding, swelling, or bruising at the needle insertion site.  Infection. BEFORE THE PROCEDURE  This is a procedure that can be done as an outpatient. Confirm the time that you need to arrive for your procedure. Confirm whether there is a need to fast or withhold any medications. It is helpful to wear clothing with sleeves that can be raised above the elbow. A blood sample may be done to determine the  amount of red blood cells or iron in your blood. Plan ahead of time to have someone drive you home after the procedure. PROCEDURE The entire procedure from preparation through recovery takes about 1 hour. The actual collection takes about 10 to 15 minutes.  A needle will be inserted into your vein.  Tubing and a collection bag will be attached to that needle.  Blood will flow through the needle and tubing into the collection bag.  You may be asked to open and close your hand slowly and continuously during the entire collection.  Once the specified amount of blood has been removed from your body, the collection bag and tubing will be clamped.  The needle will be removed.  Pressure will be held on the site of the needle insertion to stop the bleeding. Then a bandage will be placed over the needle insertion site. AFTER THE PROCEDURE  Your recovery will be assessed and monitored. If there are no problems, as an outpatient, you should be able to go home shortly after the procedure.  Document Released: 04/04/2011 Document Revised: 01/23/2012 Document Reviewed: 04/04/2011 [facility] Patient Information 2014 [place], [place]. Deep Vein Thrombosis A deep vein thrombosis (DVT) is a blood clot that develops in a deep vein. A DVT is a clot in the deep, larger veins of the leg, arm, or pelvis. These are more dangerous than clots that might form in veins near the surface of the body. A DVT can lead to complications if the clot breaks off and travels in the bloodstream to the lungs.  A DVT can  damage the valves in your leg veins, so that instead of flowing upwards, the blood pools in the lower leg. This is called post-thrombotic syndrome, and can result in pain, swelling, discoloration, and sores on the leg. Once identified, a DVT can be treated. It can also be prevented in some circumstances. Once you have had a DVT, you may be at increased risk for a DVT in the future. CAUSES Blood clots form in a vein for  different reasons. Usually several things contribute to blood clots. Contributing factors include:  The flow of blood slows down.  The inside of the vein is damaged in some way.  The person has a condition that makes blood clot more easily. Some people are more likely than others to develop blood clots. That is because they have more factors that make clots likely. These are called risk factors. Risk factors include:   Older age, especially over 81 years old.  Having a history of blood clots. This means you have had one before. Or, it means that someone else in your family has had blood clots. You may have a genetic tendency to form clots.  Having major or lengthy surgery. This is especially true for surgery on the hip, knee, or belly (abdomen). Hip surgery is particularly high risk.  Breaking a hip or leg.  Sitting or lying still for a long time. This includes long distance travel, paralysis, or recovery from an illness or surgery.  Cancer, or cancer treatment.  Having a long, thin tube (catheter) placed inside a vein during a medical procedure.  Being overweight (obese).  Pregnancy and childbirth. Hormone changes make the blood clot more easily during pregnancy. The fetus puts pressure on the veins of the pelvis. There is also risk of injury to veins during delivery or a caesarean. The risk is at its highest just after childbirth.  Medicines with the male hormone estrogen. This includes birth control pills and hormone replacement therapy.  Smoking.  Other circulation or heart problems. SYMPTOMS When a clot forms, it can either partially or totally block the blood flow in that vein. Symptoms of a DVT can include:  Swelling of the leg or arm, especially if one side is much worse.  Warmth and redness of the leg or arm, especially if one side is much worse.  Pain in an arm or leg. If the clot is in the leg, symptoms may be more noticeable or worse when standing or walking. The  symptoms of a DVT that has traveled to the lungs (pulmonary embolism, PE) usually start suddenly, and include:  Shortness of breath.  Coughing.  Coughing up blood or blood-tinged phlegm.  Chest pain. The chest pain is often worse with deep breaths.  Rapid heartbeat. Anyone with these symptoms should get emergency medical treatment right away. Call your local emergency services (911 in [place] if you have these symptoms. DIAGNOSIS If a DVT is suspected, your caregiver will take a full medical history and carry out a physical exam. Tests that also may be required include:  Blood tests, including studies of the clotting properties of the blood.  Ultrasonography to see if you have clots in your legs or lungs.  X-rays to show the flow of blood when dye is injected into the veins (venography).  Studies of your lungs, if you have any chest symptoms. PREVENTION  Exercise the legs regularly. Take a brisk 30 minute walk every day.  Maintain a weight that is appropriate for your height.  Avoid sitting  or lying in bed for long periods of time without moving your legs.  Women, particularly those over the age of 36, should consider the risks and benefits of taking estrogen medicines, including birth control pills.  Do not smoke, especially if you take estrogen medicines.  Long distance travel can increase your risk of DVT. You should exercise your legs by walking or pumping the muscles every hour.  In-hospital prevention:  Many of the risk factors above relate to situations that exist with hospitalization, either for illness, injury, or elective surgery.  Your caregiver will assess you for the need for venous thromboembolism prophylaxis when you are admitted to the hospital. If you are having surgery, your surgeon will assess you the day of or day after surgery.  Prevention may include medical and nonmedical measures. TREATMENT Treatment for DVT helps prevent death and disability. The  most common treatment for DVT is blood thinning (anticoagulant) medicine, which reduces the blood's tendency to clot. Anticoagulants can stop new blood clots from forming and old ones from growing. They cannot dissolve existing clots. Your body does this by itself over time. Anticoagulants can be given by mouth, by intravenous (IV) access, or by injection. Your caregiver will determine the best program for you.  Heparin or related medicines (low molecular weight heparin) are usually the first treatment for a blood clot. They act quickly. However, they cannot be taken orally.  Heparin can cause a fall in a component of blood that stops bleeding and forms blood clots (platelets). You will be monitored with blood tests to be sure this does not occur.  Warfarin is an anticoagulant that can be swallowed (taken orally). It takes a few days to start working, so usually heparin or related medicines are used in combination. Once warfarin is working, heparin is usually stopped.  Less commonly, clot dissolving drugs (thrombolytics) are used to dissolve a DVT. They carry a high risk of bleeding, so they are used mainly in severe cases, where a life or limb is threatened.  Very rarely, a blood clot in the leg needs to be removed surgically.  If you are unable to take anticoagulants, your caregiver may arrange for you to have a filter placed in a main vein in your belly (abdomen). This filter prevents clots from traveling to your lungs. HOME CARE INSTRUCTIONS  Take all medicines prescribed by your caregiver. Follow the directions carefully.  Warfarin. Most people will continue taking warfarin after hospital discharge. Your caregiver will advise you on the length of treatment (usually 3 6 months, sometimes lifelong).  Too much and too little warfarin are both dangerous. Too much warfarin increases the risk of bleeding. Too little warfarin continues to allow the risk for blood clots. While taking warfarin, you  will need to have regular blood tests to measure your blood clotting time. These blood tests usually include both the prothrombin time (PT) and international normalized ratio (INR) tests. The PT and INR results allow your caregiver to adjust your dose of warfarin. The dose can change for many reasons. It is critically important that you take warfarin exactly as prescribed, and that you have your PT and INR levels drawn exactly as directed.  Many foods, especially foods high in vitamin K can interfere with warfarin and affect the PT and INR results. Foods high in vitamin K include spinach, kale, broccoli, cabbage, collard and turnip greens, brussels sprouts, peas, cauliflower, seaweed, and parsley as well as beef and pork liver, green tea, and soybean oil. You  should eat a consistent amount of foods high in vitamin K. Avoid major changes in your diet, or notify your caregiver before changing your diet. Arrange a visit with a dietitian to answer your questions.  Many medicines can interfere with warfarin and affect the PT and INR results. You must tell your caregiver about any and all medicines you take, this includes all vitamins and supplements. Be especially cautious with aspirin and anti-inflammatory medicines. Ask your caregiver before taking these. Do not take or discontinue any prescribed or over-the-counter medicine except on the advice of your caregiver or pharmacist.  Warfarin can have side effects, primarily excessive bruising or bleeding. You will need to hold pressure over cuts for longer than usual. Your caregiver or pharmacist will discuss other potential side effects.  Alcohol can change the body's ability to handle warfarin. It is best to avoid alcoholic drinks or consume only very small amounts while taking warfarin. Notify your caregiver if you change your alcohol intake.  Notify your dentist or other caregivers before procedures.  Activity. Ask your caregiver how soon you can go back to  normal activities. It is important to stay active to prevent blood clots. If you are on anticoagulant medicine, avoid contact sports.  Exercise. It is very important to exercise. This is especially important while traveling, sitting or standing for long periods of time. Exercise your legs by walking or by pumping the muscles frequently. Take frequent walks.  Compression stockings. These are tight elastic stockings that apply pressure to the lower legs. This pressure can help keep the blood in the legs from clotting. You may need to wear compressions stockings at home to help prevent a DVT.  Smoking. If you smoke, quit. Ask your caregiver for help with quitting smoking.  Learn as much as you can about DVT. Knowing more about the condition should help you keep it from coming back.  Wear a medical alert bracelet or carry a medical alert card. SEEK MEDICAL CARE IF:  You notice a rapid heartbeat.  You feel weaker or more tired than usual.  You feel faint.  You notice increased bruising.  You feel your symptoms are not getting better in the time expected.  You believe you are having side effects of medicine. SEEK IMMEDIATE MEDICAL CARE IF:  You have chest pain.  You have trouble breathing.  You have new or increased swelling or pain in one leg.  You cough up blood.  You notice blood in vomit, in a bowel movement, or in urine. MAKE SURE YOU:  Understand these instructions.  Will watch your condition.  Will get help right away if you are not doing well or get worse. Document Released: 10/31/2005 Document Revised: 07/25/2012 Document Reviewed: 12/23/2010 [facility] Patient Information 2014 [place], [place]. Polycythemia Vera  Polycythemia Dwana Curd is a condition in which the body makes too many red blood cells and there is no known cause. The red blood cells (erythrocytes) are the cells which carry the oxygen in your blood stream to the cells of your body. Because of the increased red  blood cells, the blood becomes thicker and does not circulate as well. It would be similar to your car having oil which is too thick so it cannot start and circulate as well. When the blood is too thick it often causes headaches and dizziness. It may also cause blood clots. Even though the blood clots easier, these patients bleed easier. The bleeding is caused because the blood cells which help stop bleeding (platelets)  do not function normally. It occurs in all age groups but is more common in the 1 to 73 year age range. TREATMENT  The treatment of polycythemia vera for many years has been blood removal (phlebotomy) which is similar to blood removal in a blood bank, however this blood is not used for donation. Hydroxyurea is used to supplement phlebotomy. Aspirin is commonly given to thin the blood as long as the patient does not have a problem with bleeding. Other drugs are used based on the progression of the disease. Document Released: 07/26/2001 Document Revised: 01/23/2012 Document Reviewed: 01/30/2009 [facility] Patient Information 2014 [place], [place]. Apixaban oral tablets What is this medicine? APIXABAN is an anticoagulant (blood thinner). It is used to lower the chance of stroke in people with a medical condition called atrial fibrillation. It is also used after knee or hip surgeries to prevent blood clots. This medicine may be used for other purposes; ask your health care provider or pharmacist if you have questions. COMMON BRAND NAME(S): Eliquis What should I tell my health care provider before I take this medicine? They need to know if you have any of these conditions: -bleeding disorders -bleeding in the brain -blood in your stools (black or tarry stools) or if you have blood in your vomit -history of stomach bleeding -kidney disease -liver disease -mechanical heart valve -an unusual or allergic reaction to apixaban, other medicines, foods, dyes, or preservatives -pregnant or trying  to get pregnant -breast-feeding How should I use this medicine? Take this medicine by mouth with a glass of water. Follow the directions on the prescription label. You can take it with or without food. If it upsets your stomach, take it with food. Take your medicine at regular intervals. Do not take it more often than directed. Do not stop taking except on your doctor's advice. Stopping this medicine may increase your risk of a blot clot. Be sure to refill your prescription before you run out of medicine. Talk to your pediatrician regarding the use of this medicine in children. Special care may be needed. Overdosage: If you think you have taken too much of this medicine contact a poison control center or emergency room at once. NOTE: This medicine is only for you. Do not share this medicine with others. What if I miss a dose? If you miss a dose, take it as soon as you can. If it is almost time for your next dose, take only that dose. Do not take double or extra doses. What may interact with this medicine? This medicine may interact with the following: -aspirin and aspirin-like medicines -certain medicines for fungal infections like ketoconazole and itraconazole -certain medicines for seizures like carbamazepine and phenytoin -certain medicines that treat or prevent blood clots like warfarin, enoxaparin, and dalteparin -clarithromycin -NSAIDs, medicines for pain and inflammation, like ibuprofen or naproxen -rifampin -ritonavir -St. John's wort This list may not describe all possible interactions. Give your health care provider a list of all the medicines, herbs, non-prescription drugs, or dietary supplements you use. Also tell them if you smoke, drink alcohol, or use illegal drugs. Some items may interact with your medicine. What should I watch for while using this medicine? If you are going to have surgery, tell your doctor or health care professional that you are taking this medicine. Avoid  sports and activities that might cause injury while you are using this medicine. Severe falls or injuries can cause unseen bleeding. Be careful when using sharp tools or knives. Consider using  an Neurosurgeon. Take special care brushing or flossing your teeth. Report any injuries, bruising, or red spots on the skin to your doctor or health care professional. What side effects may I notice from receiving this medicine? Side effects that you should report to your doctor or health care professional as soon as possible: -allergic reactions like skin rash, itching or hives, swelling of the face, lips, or tongue -signs and symptoms of bleeding such as bloody or black, tarry stools; red or dark-brown urine; spitting up blood or brown material that looks like coffee grounds; red spots on the skin; unusual bruising or bleeding from the eye, gums, or nose -signs and symptoms of a blood clot such as breathing problems; changes in vision; chest pain; severe, sudden headache; pain, swelling, warmth in the leg; trouble speaking; sudden numbness or weakness of the face, arm or leg This list may not describe all possible side effects. Call your doctor for medical advice about side effects. You may report side effects to FDA at 1-800-FDA-1088. Where should I keep my medicine? Keep out of the reach of children. Store at room temperature between 20 and 25 degrees C (68 and 77 degrees F). Throw away any unused medicine after the expiration date. NOTE: This sheet is a summary. It may not cover all possible information. If you have questions about this medicine, talk to your doctor, pharmacist, or health care provider.  2014, [employer]. (2013-02-18 13:12:26)
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
## 2013-10-07 ENCOUNTER — Telehealth: Payer: Self-pay | Admitting: Internal Medicine

## 2013-10-07 ENCOUNTER — Telehealth: Payer: Self-pay | Admitting: *Deleted

## 2013-10-07 ENCOUNTER — Encounter: Payer: Self-pay | Admitting: *Deleted

## 2013-10-07 NOTE — Telephone Encounter (Signed)
Talked to pt and gave him appt for lab and phlebotomy on 12/5

## 2013-10-07 NOTE — Telephone Encounter (Signed)
Per staff message and POF I have scheduled appts.  JMW  

## 2013-10-08 ENCOUNTER — Encounter (HOSPITAL_COMMUNITY): Payer: Self-pay | Admitting: Emergency Medicine

## 2013-10-08 ENCOUNTER — Emergency Department (HOSPITAL_COMMUNITY)
Admission: EM | Admit: 2013-10-08 | Discharge: 2013-10-08 | Disposition: A | Discharge status: Home / Self Care | Payer: MEDICARE | Attending: Emergency Medicine | Admitting: Emergency Medicine

## 2013-10-08 DIAGNOSIS — Z87828 Personal history of other (healed) physical injury and trauma: Secondary | ICD-10-CM | POA: Insufficient documentation

## 2013-10-08 DIAGNOSIS — M5380 Other specified dorsopathies, site unspecified: Secondary | ICD-10-CM | POA: Insufficient documentation

## 2013-10-08 DIAGNOSIS — Z8701 Personal history of pneumonia (recurrent): Secondary | ICD-10-CM | POA: Insufficient documentation

## 2013-10-08 DIAGNOSIS — I252 Old myocardial infarction: Secondary | ICD-10-CM | POA: Insufficient documentation

## 2013-10-08 DIAGNOSIS — Z880 Allergy status to penicillin: Secondary | ICD-10-CM | POA: Insufficient documentation

## 2013-10-08 DIAGNOSIS — I5030 Unspecified diastolic (congestive) heart failure: Secondary | ICD-10-CM | POA: Insufficient documentation

## 2013-10-08 DIAGNOSIS — Z86718 Personal history of other venous thrombosis and embolism: Secondary | ICD-10-CM | POA: Insufficient documentation

## 2013-10-08 DIAGNOSIS — K219 Gastro-esophageal reflux disease without esophagitis: Secondary | ICD-10-CM | POA: Insufficient documentation

## 2013-10-08 DIAGNOSIS — Z8639 Personal history of other endocrine, nutritional and metabolic disease: Secondary | ICD-10-CM | POA: Insufficient documentation

## 2013-10-08 DIAGNOSIS — Z8709 Personal history of other diseases of the respiratory system: Secondary | ICD-10-CM | POA: Insufficient documentation

## 2013-10-08 DIAGNOSIS — I129 Hypertensive chronic kidney disease with stage 1 through stage 4 chronic kidney disease, or unspecified chronic kidney disease: Secondary | ICD-10-CM | POA: Insufficient documentation

## 2013-10-08 DIAGNOSIS — N183 Chronic kidney disease, stage 3 unspecified: Secondary | ICD-10-CM | POA: Insufficient documentation

## 2013-10-08 DIAGNOSIS — Z79899 Other long term (current) drug therapy: Secondary | ICD-10-CM | POA: Insufficient documentation

## 2013-10-08 DIAGNOSIS — Z8589 Personal history of malignant neoplasm of other organs and systems: Secondary | ICD-10-CM | POA: Insufficient documentation

## 2013-10-08 DIAGNOSIS — R011 Cardiac murmur, unspecified: Secondary | ICD-10-CM | POA: Insufficient documentation

## 2013-10-08 DIAGNOSIS — M62830 Muscle spasm of back: Secondary | ICD-10-CM

## 2013-10-08 DIAGNOSIS — Z7901 Long term (current) use of anticoagulants: Secondary | ICD-10-CM | POA: Insufficient documentation

## 2013-10-08 DIAGNOSIS — Z95810 Presence of automatic (implantable) cardiac defibrillator: Secondary | ICD-10-CM | POA: Insufficient documentation

## 2013-10-08 DIAGNOSIS — Z862 Personal history of diseases of the blood and blood-forming organs and certain disorders involving the immune mechanism: Secondary | ICD-10-CM | POA: Insufficient documentation

## 2013-10-08 DIAGNOSIS — Z8619 Personal history of other infectious and parasitic diseases: Secondary | ICD-10-CM | POA: Insufficient documentation

## 2013-10-08 DIAGNOSIS — I2510 Atherosclerotic heart disease of native coronary artery without angina pectoris: Secondary | ICD-10-CM | POA: Insufficient documentation

## 2013-10-08 MED ORDER — METHOCARBAMOL 500 MG PO TABS: 500.0000 mg | ORAL_TABLET | Freq: Two times a day (BID) | ORAL | Status: DC
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
## 2013-10-08 NOTE — ED Provider Notes (Signed)
Medical screening examination/treatment/procedure(s) were performed by non-physician practitioner and as supervising physician I was immediately available for consultation/collaboration.     Brionne Mertz, MD 10/08/13 2220 

## 2013-10-08 NOTE — ED Notes (Signed)
Right lower back pain since this am.  Pt denies any trauma with this.  No numbness or weakness with this.

## 2013-10-08 NOTE — ED Notes (Signed)
Denies radiation of the pain. Denies numbness to legs. Or change in control of bowel/bladder. States he cannot take narcotics because they make him sick on his stomach

## 2013-10-15 ENCOUNTER — Encounter: Payer: Self-pay | Admitting: Family Medicine

## 2013-10-15 ENCOUNTER — Telehealth: Payer: Self-pay | Admitting: Family Medicine

## 2013-10-15 ENCOUNTER — Ambulatory Visit (INDEPENDENT_AMBULATORY_CARE_PROVIDER_SITE_OTHER): Payer: BC Managed Care – PPO | Admitting: Family Medicine

## 2013-10-15 VITALS — BP 128/80 | HR 68 | Wt 182.0 lb

## 2013-10-15 DIAGNOSIS — M62830 Muscle spasm of back: Secondary | ICD-10-CM

## 2013-10-15 DIAGNOSIS — I82409 Acute embolism and thrombosis of unspecified deep veins of unspecified lower extremity: Secondary | ICD-10-CM

## 2013-10-15 DIAGNOSIS — M5380 Other specified dorsopathies, site unspecified: Secondary | ICD-10-CM

## 2013-10-15 DIAGNOSIS — I82401 Acute embolism and thrombosis of unspecified deep veins of right lower extremity: Secondary | ICD-10-CM

## 2013-10-15 NOTE — Patient Instructions (Signed)
Heat to your back for 20 minutes 3 times per day. After you done with heat and stretch Continued use of muscle relaxer but mainly at night. Take 2 Aleve twice per day

## 2013-10-15 NOTE — Telephone Encounter (Signed)
ER letter sent 

## 2013-10-15 NOTE — Progress Notes (Signed)
   Subjective:    Patient ID: [name] Moreno, male    DOB: 03/16/1961, 52 y.o.   MRN: [id number]  HPI He is here for followup visit after being seen in the emergency room on November 25 for evaluation of back spasm. That record was reviewed. He states that his back spasms has improved. He was given Robaxin. He also states that he is doing much better on the Eliquis. He is having no difficulty with that.   Review of Systems     Objective:   Physical Exam Alert and in no distress. Exam of his back does show palpable tenderness in the right mid lumbar paraspinal muscles with good motion of his back. No palpable lesions were appreciated.       Assessment & Plan:  DVT (deep venous thrombosis), right  Back spasm  he will continue on Eliquis for the full 6 month course. Also recommended heat, stretching, Aleve and Robaxin mainly at night. Reassured him that this should slowly go away.

## 2013-10-18 ENCOUNTER — Ambulatory Visit: Payer: BC Managed Care – PPO

## 2013-10-18 ENCOUNTER — Other Ambulatory Visit (HOSPITAL_BASED_OUTPATIENT_CLINIC_OR_DEPARTMENT_OTHER): Payer: BC Managed Care – PPO | Admitting: Lab

## 2013-10-18 DIAGNOSIS — D45 Polycythemia vera: Secondary | ICD-10-CM

## 2013-10-18 DIAGNOSIS — D696 Thrombocytopenia, unspecified: Secondary | ICD-10-CM

## 2013-10-18 LAB — CBC WITH DIFFERENTIAL/PLATELET
BASO%: 0.7 % (ref 0.0–2.0)
Eosinophils Absolute: 0.1 10*3/uL (ref 0.0–0.5)
MCH: 32.9 pg (ref 27.2–33.4)
MCHC: 34 g/dL (ref 32.0–36.0)
MONO#: 0.4 10*3/uL (ref 0.1–0.9)
NEUT#: 2.8 10*3/uL (ref 1.5–6.5)
RBC: 4.39 10*6/uL (ref 4.20–5.82)
RDW: 13 % (ref 11.0–14.6)
WBC: 4.4 10*3/uL (ref 4.0–10.3)
lymph#: 1.1 10*3/uL (ref 0.9–3.3)

## 2013-10-18 NOTE — Progress Notes (Signed)
Discussed labs with Dr. Sonda Rumble phlebotomy today since Hct is in goal range. Patient notified and provided copy of his counts. Instructed to follow up as scheduled.

## 2013-11-01 ENCOUNTER — Encounter: Payer: Self-pay | Admitting: Internal Medicine

## 2013-11-01 ENCOUNTER — Telehealth: Payer: Self-pay | Admitting: Medical Oncology

## 2013-11-01 ENCOUNTER — Ambulatory Visit (INDEPENDENT_AMBULATORY_CARE_PROVIDER_SITE_OTHER): Payer: BC Managed Care – PPO | Admitting: Family Medicine

## 2013-11-01 ENCOUNTER — Telehealth: Payer: Self-pay | Admitting: Internal Medicine

## 2013-11-01 ENCOUNTER — Ambulatory Visit
Admission: RE | Admit: 2013-11-01 | Discharge: 2013-11-01 | Disposition: A | Discharge status: Home / Self Care | Payer: BC Managed Care – PPO | Source: Ambulatory Visit | Attending: Family Medicine | Admitting: Family Medicine

## 2013-11-01 ENCOUNTER — Telehealth: Payer: Self-pay | Admitting: *Deleted

## 2013-11-01 ENCOUNTER — Encounter: Payer: Self-pay | Admitting: Family Medicine

## 2013-11-01 ENCOUNTER — Ambulatory Visit (HOSPITAL_COMMUNITY)
Admission: RE | Admit: 2013-11-01 | Discharge: 2013-11-01 | Disposition: A | Discharge status: Home / Self Care | Payer: MEDICARE | Source: Ambulatory Visit | Attending: Internal Medicine | Admitting: Internal Medicine

## 2013-11-01 ENCOUNTER — Ambulatory Visit (HOSPITAL_BASED_OUTPATIENT_CLINIC_OR_DEPARTMENT_OTHER): Payer: BC Managed Care – PPO | Admitting: Internal Medicine

## 2013-11-01 ENCOUNTER — Other Ambulatory Visit (HOSPITAL_BASED_OUTPATIENT_CLINIC_OR_DEPARTMENT_OTHER): Payer: BC Managed Care – PPO

## 2013-11-01 VITALS — BP 134/79 | HR 69 | Temp 97.0°F | Resp 18 | Ht 61.0 in | Wt 184.8 lb

## 2013-11-01 VITALS — BP 124/80 | HR 74 | Wt 184.0 lb

## 2013-11-01 DIAGNOSIS — I82401 Acute embolism and thrombosis of unspecified deep veins of right lower extremity: Secondary | ICD-10-CM

## 2013-11-01 DIAGNOSIS — D45 Polycythemia vera: Secondary | ICD-10-CM

## 2013-11-01 DIAGNOSIS — Z905 Acquired absence of kidney: Secondary | ICD-10-CM

## 2013-11-01 DIAGNOSIS — I82409 Acute embolism and thrombosis of unspecified deep veins of unspecified lower extremity: Secondary | ICD-10-CM

## 2013-11-01 DIAGNOSIS — Z9889 Other specified postprocedural states: Secondary | ICD-10-CM

## 2013-11-01 DIAGNOSIS — M62830 Muscle spasm of back: Secondary | ICD-10-CM

## 2013-11-01 DIAGNOSIS — M79609 Pain in unspecified limb: Secondary | ICD-10-CM

## 2013-11-01 DIAGNOSIS — M5380 Other specified dorsopathies, site unspecified: Secondary | ICD-10-CM

## 2013-11-01 DIAGNOSIS — M549 Dorsalgia, unspecified: Secondary | ICD-10-CM

## 2013-11-01 DIAGNOSIS — D696 Thrombocytopenia, unspecified: Secondary | ICD-10-CM

## 2013-11-01 DIAGNOSIS — Z86718 Personal history of other venous thrombosis and embolism: Secondary | ICD-10-CM

## 2013-11-01 DIAGNOSIS — K130 Diseases of lips: Secondary | ICD-10-CM

## 2013-11-01 LAB — CBC WITH DIFFERENTIAL/PLATELET
Basophils Absolute: 0 10*3/uL (ref 0.0–0.1)
Eosinophils Absolute: 0.1 10*3/uL (ref 0.0–0.5)
HCT: 43.5 % (ref 38.4–49.9)
HGB: 14.1 g/dL (ref 13.0–17.1)
MCH: 30.8 pg (ref 27.2–33.4)
MCHC: 32.4 g/dL (ref 32.0–36.0)
MCV: 94.8 fL (ref 79.3–98.0)
MONO%: 10.4 % (ref 0.0–14.0)
NEUT#: 2.5 10*3/uL (ref 1.5–6.5)
NEUT%: 59.1 % (ref 39.0–75.0)
Platelets: 103 10*3/uL — ABNORMAL LOW (ref 140–400)
RDW: 13.1 % (ref 11.0–14.6)
lymph#: 1.1 10*3/uL (ref 0.9–3.3)

## 2013-11-01 LAB — COMPREHENSIVE METABOLIC PANEL (CC13)
Albumin: 4.1 g/dL (ref 3.5–5.0)
Alkaline Phosphatase: 81 U/L (ref 40–150)
Anion Gap: 10 mEq/L (ref 3–11)
BUN: 25 mg/dL (ref 7.0–26.0)
Calcium: 10 mg/dL (ref 8.4–10.4)
Chloride: 105 mEq/L (ref 98–109)
Creatinine: 1.6 mg/dL — ABNORMAL HIGH (ref 0.7–1.3)
Glucose: 92 mg/dl (ref 70–140)
Potassium: 4.3 mEq/L (ref 3.5–5.1)

## 2013-11-01 NOTE — Progress Notes (Signed)
   Subjective:    Patient ID: [name] Moreno, male    DOB: 1961-07-14, 52 y.o.   MRN: [id number]  HPI He is here for evaluation of a mid lower lip lesion. He has been placing OTC medications on a but it does not clear it up. He also continues to have right sided back pain. It has not responded to therapies that he issues so far.   Review of Systems     Objective:   Physical Exam Alert and in no distress. Exam of the lower mid lip does show a round palpable lesion in the mid lip area of approximately half a centimeter. The ovaries lying mucosa is irritated and raw. Exam shows a palpable right upper lumbar paravertebral muscle pain with good motion of his back. Normal lumbar motion and curved. No tenderness over SI joint. Hip motion is normal. Straight leg raising negative. X-ray was negative.       Assessment & Plan:  Lip lesion - Plan: Ambulatory referral to Oral Maxillofacial Surgery  Back spasm - Plan: DG Lumbar Spine Complete  I will send him to physical therapy for back rehabilitation program.

## 2013-11-01 NOTE — Patient Instructions (Signed)
Take 2 Aleve twice per day 

## 2013-11-01 NOTE — Telephone Encounter (Signed)
Per staff message and POF I have scheduled appts.  JMW  
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
## 2013-11-01 NOTE — Progress Notes (Signed)
VASCULAR LAB PRELIMINARY  PRELIMINARY  PRELIMINARY  PRELIMINARY  Left lower extremity venous duplex completed.    Preliminary report:  Left:  No evidence of DVT, superficial thrombosis, or Baker's cyst.  Teneisha Gignac, RVT 11/01/2013, 3:14 PM

## 2013-11-01 NOTE — Telephone Encounter (Signed)
Received a message from the doppler lab that pt is negative for DVT in left leg. Pt was discharged home. Dr. Rosie Fate notified.

## 2013-11-01 NOTE — Telephone Encounter (Signed)
Gave pt appt for lab and MD on january 2015,emailed michelle regarding phlebotomy

## 2013-11-04 ENCOUNTER — Emergency Department (HOSPITAL_COMMUNITY)
Admission: EM | Admit: 2013-11-04 | Discharge: 2013-11-04 | Disposition: A | Discharge status: Home / Self Care | Payer: MEDICARE | Attending: Emergency Medicine | Admitting: Emergency Medicine

## 2013-11-04 ENCOUNTER — Encounter (HOSPITAL_COMMUNITY): Payer: Self-pay | Admitting: Emergency Medicine

## 2013-11-04 DIAGNOSIS — Z8701 Personal history of pneumonia (recurrent): Secondary | ICD-10-CM | POA: Insufficient documentation

## 2013-11-04 DIAGNOSIS — K120 Recurrent oral aphthae: Secondary | ICD-10-CM

## 2013-11-04 DIAGNOSIS — M109 Gout, unspecified: Secondary | ICD-10-CM | POA: Insufficient documentation

## 2013-11-04 DIAGNOSIS — K219 Gastro-esophageal reflux disease without esophagitis: Secondary | ICD-10-CM | POA: Insufficient documentation

## 2013-11-04 DIAGNOSIS — M545 Low back pain, unspecified: Secondary | ICD-10-CM | POA: Insufficient documentation

## 2013-11-04 DIAGNOSIS — IMO0001 Reserved for inherently not codable concepts without codable children: Secondary | ICD-10-CM | POA: Insufficient documentation

## 2013-11-04 DIAGNOSIS — I252 Old myocardial infarction: Secondary | ICD-10-CM | POA: Insufficient documentation

## 2013-11-04 DIAGNOSIS — Z888 Allergy status to other drugs, medicaments and biological substances status: Secondary | ICD-10-CM | POA: Insufficient documentation

## 2013-11-04 DIAGNOSIS — K1329 Other disturbances of oral epithelium, including tongue: Secondary | ICD-10-CM | POA: Insufficient documentation

## 2013-11-04 DIAGNOSIS — E781 Pure hyperglyceridemia: Secondary | ICD-10-CM | POA: Insufficient documentation

## 2013-11-04 DIAGNOSIS — Z9189 Other specified personal risk factors, not elsewhere classified: Secondary | ICD-10-CM | POA: Insufficient documentation

## 2013-11-04 DIAGNOSIS — Z8669 Personal history of other diseases of the nervous system and sense organs: Secondary | ICD-10-CM | POA: Insufficient documentation

## 2013-11-04 DIAGNOSIS — R011 Cardiac murmur, unspecified: Secondary | ICD-10-CM | POA: Insufficient documentation

## 2013-11-04 DIAGNOSIS — N183 Chronic kidney disease, stage 3 unspecified: Secondary | ICD-10-CM | POA: Insufficient documentation

## 2013-11-04 DIAGNOSIS — Z8619 Personal history of other infectious and parasitic diseases: Secondary | ICD-10-CM | POA: Insufficient documentation

## 2013-11-04 DIAGNOSIS — D45 Polycythemia vera: Secondary | ICD-10-CM | POA: Insufficient documentation

## 2013-11-04 DIAGNOSIS — Z79899 Other long term (current) drug therapy: Secondary | ICD-10-CM | POA: Insufficient documentation

## 2013-11-04 DIAGNOSIS — Z86718 Personal history of other venous thrombosis and embolism: Secondary | ICD-10-CM | POA: Insufficient documentation

## 2013-11-04 DIAGNOSIS — Z880 Allergy status to penicillin: Secondary | ICD-10-CM | POA: Insufficient documentation

## 2013-11-04 DIAGNOSIS — Z8679 Personal history of other diseases of the circulatory system: Secondary | ICD-10-CM | POA: Insufficient documentation

## 2013-11-04 DIAGNOSIS — I2510 Atherosclerotic heart disease of native coronary artery without angina pectoris: Secondary | ICD-10-CM | POA: Insufficient documentation

## 2013-11-04 DIAGNOSIS — Z95810 Presence of automatic (implantable) cardiac defibrillator: Secondary | ICD-10-CM | POA: Insufficient documentation

## 2013-11-04 DIAGNOSIS — I422 Other hypertrophic cardiomyopathy: Secondary | ICD-10-CM | POA: Insufficient documentation

## 2013-11-04 DIAGNOSIS — I5030 Unspecified diastolic (congestive) heart failure: Secondary | ICD-10-CM | POA: Insufficient documentation

## 2013-11-04 DIAGNOSIS — G4730 Sleep apnea, unspecified: Secondary | ICD-10-CM | POA: Insufficient documentation

## 2013-11-04 DIAGNOSIS — Z8709 Personal history of other diseases of the respiratory system: Secondary | ICD-10-CM | POA: Insufficient documentation

## 2013-11-04 MED ORDER — METHOCARBAMOL 500 MG PO TABS: 500.0000 mg | ORAL_TABLET | Freq: Two times a day (BID) | ORAL | Status: DC

## 2013-11-04 MED ORDER — MAGIC MOUTHWASH W/LIDOCAINE: 5.0000 mL | Freq: Three times a day (TID) | ORAL | Status: DC | PRN
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
## 2013-11-04 NOTE — ED Notes (Signed)
Pt. reports chronic right back muscle pain for 5 weeks , denies injury , no dysuria or hematuria , ambulatory. Pt. also reported reddness /soreness at lower lip for 3 weeks.

## 2013-11-05 NOTE — ED Provider Notes (Signed)
Medical screening examination/treatment/procedure(s) were performed by non-physician practitioner and as supervising physician I was immediately available for consultation/collaboration.  EKG Interpretation   None         Glynn Octave, MD 11/05/13 [phone]

## 2013-11-06 ENCOUNTER — Telehealth: Payer: Self-pay | Admitting: Internal Medicine

## 2013-11-06 NOTE — Telephone Encounter (Signed)
Called pt and left message regarding lab,md and phlebotomy  for January 2015

## 2013-11-08 ENCOUNTER — Encounter: Payer: Self-pay | Admitting: Internal Medicine

## 2013-11-13 ENCOUNTER — Ambulatory Visit: Payer: BC Managed Care – PPO

## 2013-11-21 ENCOUNTER — Ambulatory Visit: Payer: MEDICARE | Attending: Family Medicine

## 2013-11-21 ENCOUNTER — Telehealth: Payer: Self-pay | Admitting: Internal Medicine

## 2013-11-21 DIAGNOSIS — IMO0001 Reserved for inherently not codable concepts without codable children: Secondary | ICD-10-CM | POA: Insufficient documentation

## 2013-11-21 DIAGNOSIS — M545 Low back pain, unspecified: Secondary | ICD-10-CM | POA: Insufficient documentation

## 2013-11-21 DIAGNOSIS — R293 Abnormal posture: Secondary | ICD-10-CM | POA: Insufficient documentation

## 2013-11-21 NOTE — Telephone Encounter (Signed)
Pt states that he was suppose to be referred back in December for lip lesions(sores) to oral maxillofacial surgery and it was a Friday so they were closed. If you could refer patient

## 2013-11-21 NOTE — Telephone Encounter (Signed)
Pt sch for appt at [facility] 11-27-13  2:15pm  LMOM with appt time and date

## 2013-11-26 ENCOUNTER — Ambulatory Visit: Payer: MEDICARE | Admitting: Physical Therapy

## 2013-11-28 ENCOUNTER — Encounter: Payer: BC Managed Care – PPO | Admitting: Physical Therapy

## 2013-12-02 ENCOUNTER — Ambulatory Visit: Payer: BC Managed Care – PPO

## 2013-12-02 ENCOUNTER — Other Ambulatory Visit: Payer: BC Managed Care – PPO

## 2013-12-03 ENCOUNTER — Telehealth: Payer: Self-pay | Admitting: Internal Medicine

## 2013-12-03 ENCOUNTER — Encounter: Payer: BC Managed Care – PPO | Admitting: Internal Medicine

## 2013-12-03 NOTE — Telephone Encounter (Signed)
R/s lab,md and phlebotomy , left message with pt cell, nurse notified , gave Melissa phlebotomy r/s date, also mailed appt

## 2013-12-04 ENCOUNTER — Other Ambulatory Visit: Payer: BC Managed Care – PPO

## 2013-12-04 ENCOUNTER — Ambulatory Visit: Payer: BC Managed Care – PPO

## 2013-12-10 ENCOUNTER — Ambulatory Visit (INDEPENDENT_AMBULATORY_CARE_PROVIDER_SITE_OTHER): Payer: BC Managed Care – PPO | Admitting: Internal Medicine

## 2013-12-10 ENCOUNTER — Encounter: Payer: Self-pay | Admitting: Internal Medicine

## 2013-12-10 VITALS — BP 147/81 | HR 63 | Ht 61.0 in | Wt 181.8 lb

## 2013-12-10 DIAGNOSIS — Z95810 Presence of automatic (implantable) cardiac defibrillator: Secondary | ICD-10-CM

## 2013-12-10 DIAGNOSIS — I5022 Chronic systolic (congestive) heart failure: Secondary | ICD-10-CM

## 2013-12-10 DIAGNOSIS — I10 Essential (primary) hypertension: Secondary | ICD-10-CM

## 2013-12-10 LAB — MDC_IDC_ENUM_SESS_TYPE_INCLINIC
Brady Statistic RV Percent Paced: 0.01 %
Date Time Interrogation Session: 20150127182003
HighPow Impedance: 19 Ohm
HighPow Impedance: 342 Ohm
HighPow Impedance: 40 Ohm
HighPow Impedance: 49 Ohm
Lead Channel Pacing Threshold Pulse Width: 0.4 ms
Lead Channel Sensing Intrinsic Amplitude: 17.375 mV
Lead Channel Setting Pacing Pulse Width: 0.4 ms
MDC IDC MSMT BATTERY VOLTAGE: 3.11 V
MDC IDC MSMT LEADCHNL RV IMPEDANCE VALUE: 342 Ohm
MDC IDC MSMT LEADCHNL RV PACING THRESHOLD AMPLITUDE: 1.375 V
MDC IDC MSMT LEADCHNL RV SENSING INTR AMPL: 17.5 mV
MDC IDC SET LEADCHNL RV PACING AMPLITUDE: 2.75 V
MDC IDC SET LEADCHNL RV SENSING SENSITIVITY: 0.3 mV
MDC IDC SET ZONE DETECTION INTERVAL: 300 ms
Zone Setting Detection Interval: 350 ms
Zone Setting Detection Interval: 360 ms
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
## 2013-12-10 NOTE — Assessment & Plan Note (Signed)
Most recently, his stress test demonstrated left ventricular dysfunction. He has class I heart failure symptoms. He will continue his current medical therapy. His blood pressure has been controlled. We would consider an ARB in the future.

## 2013-12-10 NOTE — Patient Instructions (Signed)

## 2013-12-10 NOTE — Assessment & Plan Note (Signed)
I discussed the importance of a low-sodium diet with the patient. His blood pressure has been recently well controlled.

## 2013-12-10 NOTE — Assessment & Plan Note (Signed)
Is single chamber Medtronic ICD is working normally.plan to recheck in several months.

## 2013-12-11 ENCOUNTER — Encounter (HOSPITAL_COMMUNITY): Payer: Self-pay | Admitting: Emergency Medicine

## 2013-12-11 ENCOUNTER — Inpatient Hospital Stay (HOSPITAL_COMMUNITY)
Admission: EM | Admit: 2013-12-11 | Discharge: 2013-12-14 | DRG: 389 | Disposition: A | Discharge status: Home / Self Care | Payer: MEDICARE | Attending: Internal Medicine | Admitting: Internal Medicine

## 2013-12-11 ENCOUNTER — Emergency Department (HOSPITAL_COMMUNITY): Payer: MEDICARE

## 2013-12-11 DIAGNOSIS — I129 Hypertensive chronic kidney disease with stage 1 through stage 4 chronic kidney disease, or unspecified chronic kidney disease: Secondary | ICD-10-CM | POA: Diagnosis present

## 2013-12-11 DIAGNOSIS — I252 Old myocardial infarction: Secondary | ICD-10-CM

## 2013-12-11 DIAGNOSIS — R109 Unspecified abdominal pain: Secondary | ICD-10-CM

## 2013-12-11 DIAGNOSIS — G4730 Sleep apnea, unspecified: Secondary | ICD-10-CM | POA: Diagnosis present

## 2013-12-11 DIAGNOSIS — R112 Nausea with vomiting, unspecified: Secondary | ICD-10-CM

## 2013-12-11 DIAGNOSIS — Z905 Acquired absence of kidney: Secondary | ICD-10-CM

## 2013-12-11 DIAGNOSIS — N183 Chronic kidney disease, stage 3 unspecified: Secondary | ICD-10-CM | POA: Diagnosis present

## 2013-12-11 DIAGNOSIS — I422 Other hypertrophic cardiomyopathy: Secondary | ICD-10-CM | POA: Diagnosis present

## 2013-12-11 DIAGNOSIS — I5032 Chronic diastolic (congestive) heart failure: Secondary | ICD-10-CM

## 2013-12-11 DIAGNOSIS — I82409 Acute embolism and thrombosis of unspecified deep veins of unspecified lower extremity: Secondary | ICD-10-CM

## 2013-12-11 DIAGNOSIS — D45 Polycythemia vera: Secondary | ICD-10-CM | POA: Diagnosis present

## 2013-12-11 DIAGNOSIS — K56609 Unspecified intestinal obstruction, unspecified as to partial versus complete obstruction: Principal | ICD-10-CM

## 2013-12-11 DIAGNOSIS — I10 Essential (primary) hypertension: Secondary | ICD-10-CM

## 2013-12-11 DIAGNOSIS — I5042 Chronic combined systolic (congestive) and diastolic (congestive) heart failure: Secondary | ICD-10-CM | POA: Diagnosis present

## 2013-12-11 DIAGNOSIS — I2510 Atherosclerotic heart disease of native coronary artery without angina pectoris: Secondary | ICD-10-CM | POA: Diagnosis present

## 2013-12-11 DIAGNOSIS — Z95810 Presence of automatic (implantable) cardiac defibrillator: Secondary | ICD-10-CM

## 2013-12-11 DIAGNOSIS — K56600 Partial intestinal obstruction, unspecified as to cause: Secondary | ICD-10-CM

## 2013-12-11 DIAGNOSIS — I4891 Unspecified atrial fibrillation: Secondary | ICD-10-CM | POA: Diagnosis present

## 2013-12-11 DIAGNOSIS — Z86718 Personal history of other venous thrombosis and embolism: Secondary | ICD-10-CM

## 2013-12-11 DIAGNOSIS — I5022 Chronic systolic (congestive) heart failure: Secondary | ICD-10-CM

## 2013-12-11 DIAGNOSIS — Z8679 Personal history of other diseases of the circulatory system: Secondary | ICD-10-CM

## 2013-12-11 DIAGNOSIS — Z7901 Long term (current) use of anticoagulants: Secondary | ICD-10-CM

## 2013-12-11 DIAGNOSIS — K219 Gastro-esophageal reflux disease without esophagitis: Secondary | ICD-10-CM | POA: Diagnosis present

## 2013-12-11 LAB — COMPREHENSIVE METABOLIC PANEL
ALT: 39 U/L (ref 0–53)
AST: 43 U/L — ABNORMAL HIGH (ref 0–37)
Albumin: 3.9 g/dL (ref 3.5–5.2)
Alkaline Phosphatase: 81 U/L (ref 39–117)
BILIRUBIN TOTAL: 0.2 mg/dL — AB (ref 0.3–1.2)
BUN: 18 mg/dL (ref 6–23)
CHLORIDE: 102 meq/L (ref 96–112)
CO2: 22 meq/L (ref 19–32)
Calcium: 9.7 mg/dL (ref 8.4–10.5)
Creatinine, Ser: 1.58 mg/dL — ABNORMAL HIGH (ref 0.50–1.35)
GFR, EST AFRICAN AMERICAN: 57 mL/min — AB (ref >90)
GFR, EST NON AFRICAN AMERICAN: 49 mL/min — AB (ref >90)
Glucose, Bld: 95 mg/dL (ref 70–99)
Potassium: 4.5 mEq/L (ref 3.7–5.3)
SODIUM: 142 meq/L (ref 137–147)
Total Protein: 7.5 g/dL (ref 6.0–8.3)

## 2013-12-11 LAB — POCT I-STAT, CHEM 8
BUN: 22 mg/dL (ref 6–23)
CREATININE: 1.7 mg/dL — AB (ref 0.50–1.35)
Calcium, Ion: 1.23 mmol/L (ref 1.12–1.23)
Chloride: 103 mEq/L (ref 96–112)
Glucose, Bld: 98 mg/dL (ref 70–99)
HCT: 51 % (ref 39.0–52.0)
HEMOGLOBIN: 17.3 g/dL — AB (ref 13.0–17.0)
POTASSIUM: 4.2 meq/L (ref 3.7–5.3)
Sodium: 139 mEq/L (ref 137–147)
TCO2: 27 mmol/L (ref 0–100)

## 2013-12-11 LAB — CBC WITH DIFFERENTIAL/PLATELET
BASOS PCT: 0 % (ref 0–1)
Basophils Absolute: 0 10*3/uL (ref 0.0–0.1)
Eosinophils Absolute: 0.1 10*3/uL (ref 0.0–0.7)
Eosinophils Relative: 2 % (ref 0–5)
HEMATOCRIT: 46.1 % (ref 39.0–52.0)
HEMOGLOBIN: 16.7 g/dL (ref 13.0–17.0)
LYMPHS ABS: 0.8 10*3/uL (ref 0.7–4.0)
LYMPHS PCT: 14 % (ref 12–46)
MCH: 32.5 pg (ref 26.0–34.0)
MCHC: 36.2 g/dL — AB (ref 30.0–36.0)
MCV: 89.7 fL (ref 78.0–100.0)
MONOS PCT: 7 % (ref 3–12)
Monocytes Absolute: 0.4 10*3/uL (ref 0.1–1.0)
NEUTROS ABS: 4.6 10*3/uL (ref 1.7–7.7)
NEUTROS PCT: 78 % — AB (ref 43–77)
Platelets: 76 10*3/uL — ABNORMAL LOW (ref 150–400)
RBC: 5.14 MIL/uL (ref 4.22–5.81)
RDW: 13.2 % (ref 11.5–15.5)
WBC: 6 10*3/uL (ref 4.0–10.5)

## 2013-12-11 LAB — MAGNESIUM: MAGNESIUM: 1.8 mg/dL (ref 1.5–2.5)

## 2013-12-11 LAB — HEPARIN LEVEL (UNFRACTIONATED): Heparin Unfractionated: 0.59 IU/mL (ref 0.30–0.70)

## 2013-12-11 LAB — POCT I-STAT TROPONIN I: Troponin i, poc: 0.05 ng/mL (ref 0.00–0.08)

## 2013-12-11 LAB — PHOSPHORUS: Phosphorus: 3.5 mg/dL (ref 2.3–4.6)

## 2013-12-11 LAB — TSH: TSH: 0.594 u[IU]/mL (ref 0.350–4.500)

## 2013-12-11 LAB — APTT: aPTT: 33 seconds (ref 24–37)

## 2013-12-11 MED ORDER — SODIUM CHLORIDE 0.9 % IJ SOLN
3.0000 mL | Freq: Two times a day (BID) | INTRAMUSCULAR | Status: DC
  Administered 2013-12-11 – 2013-12-14 (×5): 3 mL via INTRAVENOUS

## 2013-12-11 MED ORDER — ONDANSETRON HCL 4 MG PO TABS: 4.0000 mg | ORAL_TABLET | Freq: Four times a day (QID) | ORAL | Status: DC | PRN

## 2013-12-11 MED ORDER — DILTIAZEM HCL 25 MG/5ML IV SOLN: 20.0000 mg | Freq: Four times a day (QID) | INTRAVENOUS | Status: DC

## 2013-12-11 MED ORDER — ONDANSETRON HCL 4 MG/2ML IJ SOLN
4.0000 mg | Freq: Four times a day (QID) | INTRAMUSCULAR | Status: DC | PRN
  Administered 2013-12-11 – 2013-12-12 (×3): 4 mg via INTRAVENOUS
  Filled 2013-12-11 (×3): qty 2

## 2013-12-11 MED ORDER — SODIUM CHLORIDE 0.9 % IV SOLN
Freq: Once | INTRAVENOUS | Status: AC
  Administered 2013-12-11: 1000 mL via INTRAVENOUS

## 2013-12-11 MED ORDER — ACETAMINOPHEN 650 MG RE SUPP: 650.0000 mg | Freq: Four times a day (QID) | RECTAL | Status: DC | PRN

## 2013-12-11 MED ORDER — DILTIAZEM HCL 25 MG/5ML IV SOLN
10.0000 mg | Freq: Four times a day (QID) | INTRAVENOUS | Status: DC
  Administered 2013-12-11 – 2013-12-12 (×3): 10 mg via INTRAVENOUS
  Filled 2013-12-11 (×8): qty 5

## 2013-12-11 MED ORDER — METOPROLOL TARTRATE 1 MG/ML IV SOLN
2.5000 mg | Freq: Three times a day (TID) | INTRAVENOUS | Status: DC
  Administered 2013-12-11 – 2013-12-12 (×2): 2.5 mg via INTRAVENOUS
  Filled 2013-12-11 (×5): qty 5

## 2013-12-11 MED ORDER — ONDANSETRON HCL 4 MG/2ML IJ SOLN
4.0000 mg | Freq: Once | INTRAMUSCULAR | Status: AC
  Administered 2013-12-11: 4 mg via INTRAVENOUS
  Filled 2013-12-11: qty 2

## 2013-12-11 MED ORDER — HYDROMORPHONE HCL PF 1 MG/ML IJ SOLN
1.0000 mg | Freq: Once | INTRAMUSCULAR | Status: AC
  Administered 2013-12-11: 1 mg via INTRAVENOUS
  Filled 2013-12-11: qty 1

## 2013-12-11 MED ORDER — ONDANSETRON HCL 4 MG/2ML IJ SOLN: 4.0000 mg | Freq: Four times a day (QID) | INTRAMUSCULAR | Status: DC

## 2013-12-11 MED ORDER — PANTOPRAZOLE SODIUM 40 MG IV SOLR
40.0000 mg | INTRAVENOUS | Status: DC
  Administered 2013-12-11 – 2013-12-12 (×2): 40 mg via INTRAVENOUS
  Filled 2013-12-11 (×5): qty 40

## 2013-12-11 MED ORDER — HEPARIN (PORCINE) IN NACL 100-0.45 UNIT/ML-% IJ SOLN
1350.0000 [IU]/h | INTRAMUSCULAR | Status: DC
  Administered 2013-12-11 – 2013-12-12 (×2): 1100 [IU]/h via INTRAVENOUS
  Administered 2013-12-13: 1400 [IU]/h via INTRAVENOUS
  Administered 2013-12-13: 1350 [IU]/h via INTRAVENOUS
  Administered 2013-12-13: 1400 [IU]/h via INTRAVENOUS
  Filled 2013-12-11 (×5): qty 250

## 2013-12-11 MED ORDER — BISACODYL 10 MG RE SUPP
10.0000 mg | Freq: Once | RECTAL | Status: AC
  Administered 2013-12-11: 10 mg via RECTAL
  Filled 2013-12-11: qty 1

## 2013-12-11 MED ORDER — DILTIAZEM HCL 25 MG/5ML IV SOLN
10.0000 mg | Freq: Four times a day (QID) | INTRAVENOUS | Status: DC
  Filled 2013-12-11 (×3): qty 5

## 2013-12-11 MED ORDER — MORPHINE SULFATE 2 MG/ML IJ SOLN
1.0000 mg | INTRAMUSCULAR | Status: DC | PRN
Start: 2013-12-11 — End: 2013-12-14
  Administered 2013-12-11 – 2013-12-12 (×4): 2 mg via INTRAVENOUS
  Filled 2013-12-11 (×4): qty 1

## 2013-12-11 MED ORDER — ACETAMINOPHEN 325 MG PO TABS: 650.0000 mg | ORAL_TABLET | Freq: Four times a day (QID) | ORAL | Status: DC | PRN

## 2013-12-11 MED ORDER — KCL IN DEXTROSE-NACL 40-5-0.45 MEQ/L-%-% IV SOLN
INTRAVENOUS | Status: DC
  Administered 2013-12-11: 50 mL/h via INTRAVENOUS
  Administered 2013-12-12 – 2013-12-13 (×2): via INTRAVENOUS
  Filled 2013-12-11 (×3): qty 1000

## 2013-12-11 NOTE — ED Notes (Signed)
Called radiology, reports xray is for tmrw.

## 2013-12-11 NOTE — ED Provider Notes (Signed)
Care assumed from Junius Creamer, NP at shift change. CT abdomen pending. AAS showing air-fluid levels, probable bowel obstruction. Pt with severe abdominal pain beginning around 9:00 PM last night. Pain controlled at this time. NG tube in place. 12:14 PM CT scan concerning for partial bowel obstruction. I spoke with Gen. surgery who has consulted patient, suggests medicine admission and surgery will follow. Admission accepted by Dr. Dyann Kief, [facility], tele bed. Ct Abdomen Pelvis Wo Contrast  12/11/2013   CLINICAL DATA:  Abdominal pain.  Nausea and vomiting.  EXAM: CT ABDOMEN AND PELVIS WITHOUT CONTRAST  TECHNIQUE: Multidetector CT imaging of the abdomen and pelvis was performed following the standard protocol without intravenous contrast.  COMPARISON:  CT of the abdomen and pelvis 08/29/2010.  FINDINGS: Lung Bases: Multifocal interstitial and airspace opacities throughout the left lower lobe, concerning for sequela of mild aspiration in this patient with history of vomiting. Cardiomegaly. Pacemaker/ AICD lead tip terminating in the apex of the right ventricle. Abandoned epicardial pacing wire also noted. Nasogastric tube extends into the mid stomach.  Abdomen/Pelvis: The unenhanced appearance of the liver, gallbladder, pancreas, spleen, bilateral adrenal glands and the right kidney is unremarkable. Status post left nephrectomy. No significant volume of ascites. No pneumoperitoneum. Prostate gland and urinary bladder are unremarkable in appearance.  Some gas and stool is noted throughout the colon. However, there are multiple dilated loops of small bowel measuring up to 5.7 cm in the right lower quadrant, likely within the mid to distal ileum. Multiple air-fluid levels are present within the dilated loops of small bowel. There are postoperative changes associated with multiple loops of small bowel, presumably from prior partial bowel resection. Angulation of several loops of small bowel are noted, as demonstrated on  image 45 of series 2, with there is focal narrowing and probable small bowel wall thickening. Immediately distal to this area of angulation and bowel wall thickening, the bowel remains slightly dilated, but the more distal small bowel is relatively decompressed. Likewise, the duodenum and proximal jejunum are not dilated.  Musculoskeletal: Postoperative changes in the subcutaneous fat of the upper abdomen in the midline. There are no aggressive appearing lytic or blastic lesions noted in the visualized portions of the skeleton.  IMPRESSION: 1. Unusual pattern of the small bowel dilatation throughout the mid small bowel, with multiple angulated loops of bowel, suggesting the presence of adhesions. There is also likely some small bowel wall thickening (difficult to judge on today's non contrast CT examination), as discussed above. Given the lack or of significant duodenal and proximal jejunal dilatation, and the presence of a significant volume of gas and stool throughout the colon, findings are not favored to represent a complete bowel obstruction at this time. However, early or partial small bowel obstruction is not excluded. Clinical correlation is recommended. 2. Patchy interstitial and airspace disease in the left lower lobe may represent sequela of recent aspiration in this patient with history of vomiting. 3. Cardiomegaly.   Electronically Signed   By: Vinnie Langton M.D.   On: 12/11/2013 10:11   Dg Abd Acute W/chest  12/11/2013   CLINICAL DATA:  Abdominal pain and distention  EXAM: ACUTE ABDOMEN SERIES (ABDOMEN 2 VIEW & CHEST 1 VIEW)  COMPARISON:  09/28/2013 chest radiograph  FINDINGS: Prominent cardiomediastinal contours are similar to prior. Mild retrocardiac opacity. Right chest wall battery pack with unchanged lead tip position. Unchanged isolated curvilinear wires projecting over the inferior heart margin/epicardium.  Gas and fluid distention of stomach and proximal small bowel loops  with air-fluid  levels. There is some air and stool noted within colon. Surgical clip left upper quadrant. No definite free intraperitoneal air. No acute osseous finding.  IMPRESSION: Nonspecific bowel gas pattern. Early or partial obstruction not excluded. Recommend CT.  Mild retrocardiac opacity; atelectasis/scar versus infiltrate.   Electronically Signed   By: Carlos Levering M.D.   On: 12/11/2013 06:21    Illene Labrador, PA-C 12/11/13 1215
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
## 2013-12-11 NOTE — ED Notes (Signed)
Family at bedside. 

## 2013-12-11 NOTE — ED Notes (Signed)
Pt vomiting with NG tube. Unable to hook tube up to suction d/t having to put contrast down.

## 2013-12-11 NOTE — ED Notes (Signed)
Pt encouraged to drink the PO contrast. States he was told he didn't have to. Informed he wasn't getting IV contrast but was going to have to drink for oral contrast.

## 2013-12-11 NOTE — Consult Note (Signed)
This patient looks externally much healthier than his history would indicate.  He has a pretty high grade SBO and will possible need surgery if he does not open up in 48-72 hours.  He is completely nontender.  Kathryne Eriksson. Dahlia Bailiff, MD, [place] [phone] [facility] Surgery
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
## 2013-12-11 NOTE — ED Notes (Signed)
Patient transported to CT 

## 2013-12-11 NOTE — ED Notes (Signed)
Patient present with c/o severe abd pain that started about 9pm  Denies vomiting or diahrrea but was nauseated on the way .

## 2013-12-11 NOTE — ED Provider Notes (Signed)
Medical screening examination/treatment/procedure(s) were performed by non-physician practitioner and as supervising physician I was immediately available for consultation/collaboration.  EKG Interpretation    Date/Time:  Wednesday December 11 2013 05:02:07 EST Ventricular Rate:  76 PR Interval:  192 QRS Duration: 186 QT Interval:  489 QTC Calculation: 550 R Axis:   -109 Text Interpretation:  Sinus rhythm Consider right atrial enlargement IVCD, consider atypical LBBB No significant change since last tracing Confirmed by Jalal Rauch  MD, Joniya Boberg ([id number]) on 12/11/2013 5:25:33 AM             Kalman Drape, MD 12/11/13 2118

## 2013-12-11 NOTE — ED Notes (Signed)
Attempted to call report. RN will call right back  

## 2013-12-11 NOTE — Progress Notes (Signed)
Received [name] Moreno to 1D40 @ 1450 from ED.  Ng capped and attached to intermittend low suction - tan drainage.
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
## 2013-12-11 NOTE — ED Notes (Signed)
One bottle of contrast given via NG tube
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
## 2013-12-12 ENCOUNTER — Inpatient Hospital Stay (HOSPITAL_COMMUNITY): Payer: MEDICARE

## 2013-12-12 LAB — BASIC METABOLIC PANEL
BUN: 15 mg/dL (ref 6–23)
CALCIUM: 8.6 mg/dL (ref 8.4–10.5)
CO2: 26 meq/L (ref 19–32)
Chloride: 103 mEq/L (ref 96–112)
Creatinine, Ser: 1.47 mg/dL — ABNORMAL HIGH (ref 0.50–1.35)
GFR calc Af Amer: 62 mL/min — ABNORMAL LOW (ref >90)
GFR, EST NON AFRICAN AMERICAN: 54 mL/min — AB (ref >90)
GLUCOSE: 101 mg/dL — AB (ref 70–99)
POTASSIUM: 4.2 meq/L (ref 3.7–5.3)
Sodium: 141 mEq/L (ref 137–147)

## 2013-12-12 LAB — CBC
HCT: 43.3 % (ref 39.0–52.0)
HEMOGLOBIN: 15.1 g/dL (ref 13.0–17.0)
MCH: 31.7 pg (ref 26.0–34.0)
MCHC: 34.9 g/dL (ref 30.0–36.0)
MCV: 91 fL (ref 78.0–100.0)
Platelets: 71 10*3/uL — ABNORMAL LOW (ref 150–400)
RBC: 4.76 MIL/uL (ref 4.22–5.81)
RDW: 13.3 % (ref 11.5–15.5)
WBC: 4.5 10*3/uL (ref 4.0–10.5)

## 2013-12-12 LAB — HEPARIN LEVEL (UNFRACTIONATED)
Heparin Unfractionated: 0.64 IU/mL (ref 0.30–0.70)
Heparin Unfractionated: 0.56 IU/mL (ref 0.30–0.70)

## 2013-12-12 LAB — APTT
APTT: 98 s — AB (ref 24–37)
aPTT: 70 seconds — ABNORMAL HIGH (ref 24–37)

## 2013-12-12 MED ORDER — METOPROLOL TARTRATE 1 MG/ML IV SOLN
5.0000 mg | Freq: Four times a day (QID) | INTRAVENOUS | Status: DC
  Administered 2013-12-12 – 2013-12-13 (×4): 5 mg via INTRAVENOUS
  Filled 2013-12-12 (×7): qty 5

## 2013-12-12 MED ORDER — BISACODYL 10 MG RE SUPP
10.0000 mg | Freq: Once | RECTAL | Status: AC
  Administered 2013-12-12: 10 mg via RECTAL
  Filled 2013-12-12: qty 1

## 2013-12-12 MED ORDER — PHENOL 1.4 % MT LIQD
1.0000 | OROMUCOSAL | Status: DC | PRN
  Administered 2013-12-12: 1 via OROMUCOSAL
  Filled 2013-12-12: qty 177

## 2013-12-12 NOTE — Progress Notes (Signed)
[facility] PROGRESS NOTE  BANYAN GOODCHILD ZOX:[id number] DOB: December 20, 1960 DOA: 12/11/2013 PCP: [name], MD  Assessment/Plan: 1-Small bowel obstruction: patient with hx of 3 abd surgery in the past and CT scan showing early high grade SBO on admission; most likely due to adhesions.  -NGT clamp now -abd x-ray demonstrating significant improvement in intestine dilatation  -continue antiemetics and pain meds as needed  -d/5 1/2 NS  -close follow up of electrolytes  -had hx of significant amount of loperamide use on daily bases and for the last 2 weeks ongoing constipation (will repeat1 dose of dulcolax supp)  -sips of clear   2-Essential hypertension, benign: will treat with metoprolol  IV   3-Systolic and diastolic heart failure, chronic: compensated.  -will follow close Intake and output  -daily weight  -denies SOB or orthopnea  4-History of ventricular fibrillation: rate controlled.  -continue IV metoprolol  -will resume home regimen when taking PO's  5-CKD (chronic kidney disease) stage 3, GFR 30-59 ml/min: baseline Cr 1.6  -stable and at baseline -will monitor  6-Polycythemia vera(238.4): stable. Will continue follow up with Dr. Juliann Mule at [facility] in outpatient setting  -Hgb 17.   7-DVT (deep venous thrombosis): heparin per pharmacy and will hold eliquis; in case surgery is required.  -resume eliquis when able to take PO's  8-GERD: continue PPI, IV for now.   Code Status: Full Family Communication: fiance at bedside  Disposition Plan: home when medically stable   Consultants:  General surgery  Procedures:  See below for x-ray reports  Antibiotics:  None   HPI/Subjective: Afebrile, feeling better. Passing gas NGT has retrieved 1300 ml over 24 hours period, no really scant no tone. abd x-ray with significant improvement.  Objective: Filed Vitals:   12/12/13 1356  BP: 134/78  Pulse: 78  Temp: 99 F (37.2 C)  Resp: 16     Intake/Output Summary (Last 24 hours) at 12/12/13 1648 Last data filed at 12/12/13 1605  Gross per 24 hour  Intake 988.72 ml  Output   2375 ml  Net -1386.28 ml   Filed Weights   12/11/13 0434 12/12/13 0348  Weight: 82.555 kg (182 lb) 79.6 kg (175 lb 7.8 oz)    Exam:   General:  Feeling better, complaining of sore throat due to NGT  Cardiovascular: S1 and S2, no rubs or gallops; soft SEM  Respiratory: CTA bilaterally  Abdomen: mild mid abd soreness, no distension, positive BS; passing gas, no BM  Musculoskeletal: no edema, FROM  Data Reviewed: Basic Metabolic Panel:  Recent Labs Lab 12/11/13 0545 12/11/13 0552 12/11/13 1828 12/12/13 0540  NA 142 139  --  141  K 4.5 4.2  --  4.2  CL 102 103  --  103  CO2 22  --   --  26  GLUCOSE 95 98  --  101*  BUN 18 22  --  15  CREATININE 1.58* 1.70*  --  1.47*  CALCIUM 9.7  --   --  8.6  MG  --   --  1.8  --   PHOS  --   --  3.5  --    Liver Function Tests:  Recent Labs Lab 12/11/13 0545  AST 43*  ALT 39  ALKPHOS 81  BILITOT 0.2*  PROT 7.5  ALBUMIN 3.9   CBC:  Recent Labs Lab 12/11/13 0530 12/11/13 0552 12/12/13 0540  WBC 6.0  --  4.5  NEUTROABS 4.6  --   --   HGB 16.7  17.3* 15.1  HCT 46.1 51.0 43.3  MCV 89.7  --  91.0  PLT 76*  --  71*   BNP (last 3 results)  Recent Labs  05/26/13 2034 09/19/13 1412 09/28/13 0512  PROBNP 514.6* 518.2* 1131.0*    Studies: Ct Abdomen Pelvis Wo Contrast  12/11/2013   CLINICAL DATA:  Abdominal pain.  Nausea and vomiting.  EXAM: CT ABDOMEN AND PELVIS WITHOUT CONTRAST  TECHNIQUE: Multidetector CT imaging of the abdomen and pelvis was performed following the standard protocol without intravenous contrast.  COMPARISON:  CT of the abdomen and pelvis 08/29/2010.  FINDINGS: Lung Bases: Multifocal interstitial and airspace opacities throughout the left lower lobe, concerning for sequela of mild aspiration in this patient with history of vomiting. Cardiomegaly. Pacemaker/  AICD lead tip terminating in the apex of the right ventricle. Abandoned epicardial pacing wire also noted. Nasogastric tube extends into the mid stomach.  Abdomen/Pelvis: The unenhanced appearance of the liver, gallbladder, pancreas, spleen, bilateral adrenal glands and the right kidney is unremarkable. Status post left nephrectomy. No significant volume of ascites. No pneumoperitoneum. Prostate gland and urinary bladder are unremarkable in appearance.  Some gas and stool is noted throughout the colon. However, there are multiple dilated loops of small bowel measuring up to 5.7 cm in the right lower quadrant, likely within the mid to distal ileum. Multiple air-fluid levels are present within the dilated loops of small bowel. There are postoperative changes associated with multiple loops of small bowel, presumably from prior partial bowel resection. Angulation of several loops of small bowel are noted, as demonstrated on image 45 of series 2, with there is focal narrowing and probable small bowel wall thickening. Immediately distal to this area of angulation and bowel wall thickening, the bowel remains slightly dilated, but the more distal small bowel is relatively decompressed. Likewise, the duodenum and proximal jejunum are not dilated.  Musculoskeletal: Postoperative changes in the subcutaneous fat of the upper abdomen in the midline. There are no aggressive appearing lytic or blastic lesions noted in the visualized portions of the skeleton.  IMPRESSION: 1. Unusual pattern of the small bowel dilatation throughout the mid small bowel, with multiple angulated loops of bowel, suggesting the presence of adhesions. There is also likely some small bowel wall thickening (difficult to judge on today's non contrast CT examination), as discussed above. Given the lack or of significant duodenal and proximal jejunal dilatation, and the presence of a significant volume of gas and stool throughout the colon, findings are not  favored to represent a complete bowel obstruction at this time. However, early or partial small bowel obstruction is not excluded. Clinical correlation is recommended. 2. Patchy interstitial and airspace disease in the left lower lobe may represent sequela of recent aspiration in this patient with history of vomiting. 3. Cardiomegaly.   Electronically Signed   By: Vinnie Langton M.D.   On: 12/11/2013 10:11   Dg Abd 2 Views  12/12/2013   CLINICAL DATA:  Abdominal pain, partial small bowel obstruction  EXAM: ABDOMEN - 2 VIEW  COMPARISON:  CT ABD/PELV WO CM dated 12/11/2013; DG ABD ACUTE W/CHEST dated 12/11/2013  FINDINGS: Compared to the plain film of 12/11/2013, there is improvement in the small bowel obstruction pattern. There are no remaining air-fluid levels within the bowel. There is gas and stool throughout the colon small a gas in the sigmoid colon. Single dilated loop of small bowel in the left upper quadrant.  IMPRESSION: Considerable improvement in small bowel obstruction pattern.  Electronically Signed   By: Suzy Bouchard M.D.   On: 12/12/2013 08:08   Dg Abd Acute W/chest  12/11/2013   CLINICAL DATA:  Abdominal pain and distention  EXAM: ACUTE ABDOMEN SERIES (ABDOMEN 2 VIEW & CHEST 1 VIEW)  COMPARISON:  09/28/2013 chest radiograph  FINDINGS: Prominent cardiomediastinal contours are similar to prior. Mild retrocardiac opacity. Right chest wall battery pack with unchanged lead tip position. Unchanged isolated curvilinear wires projecting over the inferior heart margin/epicardium.  Gas and fluid distention of stomach and proximal small bowel loops with air-fluid levels. There is some air and stool noted within colon. Surgical clip left upper quadrant. No definite free intraperitoneal air. No acute osseous finding.  IMPRESSION: Nonspecific bowel gas pattern. Early or partial obstruction not excluded. Recommend CT.  Mild retrocardiac opacity; atelectasis/scar versus infiltrate.   Electronically Signed    By: Willson Lipa Levering M.D.   On: 12/11/2013 06:21    Scheduled Meds: . metoprolol  5 mg Intravenous Q6H  . pantoprazole (PROTONIX) IV  40 mg Intravenous Q24H  . sodium chloride  3 mL Intravenous Q12H   Continuous Infusions: . dextrose 5 % and 0.45 % NaCl with KCl 40 mEq/L 50 mL/hr at 12/12/13 1314  . heparin 1,100 Units/hr (12/12/13 1609)    Time spent: >30 minutes   Gearald Stonebraker  [facility] Pager [phone]. If 7PM-7AM, please contact night-coverage at [email or web address], password [facility] 12/12/2013, 4:48 PM  LOS: 1 day

## 2013-12-12 NOTE — Progress Notes (Signed)
[place] for Heparin Indication: History of DVT  Allergies  Allergen Reactions  . Penicillins Hives and Shortness Of Breath  . Shellfish Allergy Shortness Of Breath  . Iodine Hives  . Iohexol      Desc: hives,throat,lip swelling kdean   . Milk-Related Compounds Diarrhea  . Vitamin K And Related Hives    Patient Measurements: Height: 5\' 6"  (167.6 cm) Weight: 175 lb 7.8 oz (79.6 kg) IBW/kg (Calculated) : 63.8 Heparin Dosing Weight: 80.5 kg  Vital Signs: Temp: 98.3 F (36.8 C) (01/29 0334) Temp src: Oral (01/29 0334) BP: 134/72 mmHg (01/29 0348) Pulse Rate: 76 (01/29 0348)  Labs:  Recent Labs  12/11/13 0530 12/11/13 0545 12/11/13 0552 12/11/13 1828 12/12/13 0059  HGB 16.7  --  17.3*  --   --   HCT 46.1  --  51.0  --   --   PLT 76*  --   --   --   --   APTT  --   --   --  33 70*  HEPARINUNFRC  --   --   --  0.59 0.64  CREATININE  --  1.58* 1.70*  --   --     Estimated Creatinine Clearance: 51 ml/min (by C-G formula based on Cr of 1.7).  Assessment: 53 yo male with h/o DVT, Eliquis on hold for possible surgery, for heparin   Goal of Therapy:  APTT 66-102 Heparin level 0.3-0.7 units/ml Monitor platelets by anticoagulation protocol: Yes   Plan:  Continue Heparin at current rate  Phillis Knack, PharmD, BCPS  12/12/2013,5:14 AM

## 2013-12-12 NOTE — Progress Notes (Signed)
NGT did not appear to be working, but he seems much better.  Will start to clamp NGT.  Kathryne Eriksson. Dahlia Bailiff, MD, [place] [phone] [facility]

## 2013-12-12 NOTE — Progress Notes (Signed)
[place] for Heparin Indication: History of DVT  Allergies  Allergen Reactions  . Penicillins Hives and Shortness Of Breath  . Shellfish Allergy Shortness Of Breath  . Iodine Hives  . Iohexol      Desc: hives,throat,lip swelling kdean   . Milk-Related Compounds Diarrhea  . Vitamin K And Related Hives    Patient Measurements: Height: 5\' 6"  (167.6 cm) Weight: 175 lb 7.8 oz (79.6 kg) IBW/kg (Calculated) : 63.8 Heparin Dosing Weight: 80.5 kg  Vital Signs: Temp: 99 F (37.2 C) (01/29 1356) Temp src: Oral (01/29 1356) BP: 134/78 mmHg (01/29 1356) Pulse Rate: 78 (01/29 1356)  Labs:  Recent Labs  12/11/13 0530 12/11/13 0545 12/11/13 0552 12/11/13 1828 12/12/13 0059 12/12/13 0540 12/12/13 1315  HGB 16.7  --  17.3*  --   --  15.1  --   HCT 46.1  --  51.0  --   --  43.3  --   PLT 76*  --   --   --   --  71*  --   APTT  --   --   --  33 70*  --  98*  HEPARINUNFRC  --   --   --  0.59 0.64  --  0.56  CREATININE  --  1.58* 1.70*  --   --  1.47*  --     Estimated Creatinine Clearance: 58.9 ml/min (by C-G formula based on Cr of 1.47).  Assessment: 53 yo male with h/o DVT.  Eliquis on hold for possible surgery, on heparin per pharmacy.  His first and 2nd aPTT on heparin at 1100 units/hr are 70 seconds and 98 seconds with corresponding HL 0.64 and 0.56.  Levels at 0100 am and 1315 are both therapeutic.  Surgery is treating pSBO conservatively.  To clamp NGT today. No bleeding reported.  PLCT remains low at 71 K.   Goal of Therapy:  APTT 66-102 Heparin level 0.3-0.7 units/ml Monitor platelets by anticoagulation protocol: Yes   Plan:  Continue Heparin at current rate of 1100 units/hr Check am aPTT and HL Hope to transition back to eliquis soon. Eudelia Bunch, Pharm.D. [phone] 12/12/2013 3:06 PM

## 2013-12-12 NOTE — Progress Notes (Signed)
UR completed Mayrene Bastarache K. Laurella Tull, RN, BSN, [place], CCM  12/12/2013 2:56 PM
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
## 2013-12-13 ENCOUNTER — Inpatient Hospital Stay (HOSPITAL_COMMUNITY): Payer: MEDICARE

## 2013-12-13 LAB — CBC
HEMATOCRIT: 43.7 % (ref 39.0–52.0)
Hemoglobin: 15.5 g/dL (ref 13.0–17.0)
MCH: 32.4 pg (ref 26.0–34.0)
MCHC: 35.5 g/dL (ref 30.0–36.0)
MCV: 91.4 fL (ref 78.0–100.0)
Platelets: 83 10*3/uL — ABNORMAL LOW (ref 150–400)
RBC: 4.78 MIL/uL (ref 4.22–5.81)
RDW: 13.4 % (ref 11.5–15.5)
WBC: 4.4 10*3/uL (ref 4.0–10.5)

## 2013-12-13 LAB — APTT: aPTT: 66 seconds — ABNORMAL HIGH (ref 24–37)

## 2013-12-13 LAB — HEPARIN LEVEL (UNFRACTIONATED)
HEPARIN UNFRACTIONATED: 0.71 [IU]/mL — AB (ref 0.30–0.70)
Heparin Unfractionated: <0.1 IU/mL — ABNORMAL LOW (ref 0.30–0.70)

## 2013-12-13 MED ORDER — POLYETHYLENE GLYCOL 3350 17 G PO PACK
17.0000 g | PACK | Freq: Every day | ORAL | Status: DC | PRN
  Filled 2013-12-13: qty 1

## 2013-12-13 MED ORDER — SENNOSIDES-DOCUSATE SODIUM 8.6-50 MG PO TABS
1.0000 | ORAL_TABLET | Freq: Two times a day (BID) | ORAL | Status: DC
  Administered 2013-12-13 – 2013-12-14 (×2): 1 via ORAL
  Filled 2013-12-13 (×5): qty 1

## 2013-12-13 MED ORDER — PANTOPRAZOLE SODIUM 40 MG PO TBEC
40.0000 mg | DELAYED_RELEASE_TABLET | Freq: Every day | ORAL | Status: DC
  Administered 2013-12-13 – 2013-12-14 (×2): 40 mg via ORAL
  Filled 2013-12-13: qty 1

## 2013-12-13 MED ORDER — VERAPAMIL HCL ER 240 MG PO TBCR
240.0000 mg | EXTENDED_RELEASE_TABLET | Freq: Every day | ORAL | Status: DC
  Administered 2013-12-13 – 2013-12-14 (×2): 240 mg via ORAL
  Filled 2013-12-13 (×2): qty 1

## 2013-12-13 MED ORDER — HEPARIN BOLUS VIA INFUSION
2000.0000 [IU] | Freq: Once | INTRAVENOUS | Status: AC
  Administered 2013-12-13: 2000 [IU] via INTRAVENOUS
  Filled 2013-12-13: qty 2000

## 2013-12-13 MED ORDER — MECLIZINE HCL 25 MG PO TABS
25.0000 mg | ORAL_TABLET | Freq: Three times a day (TID) | ORAL | Status: DC | PRN
  Administered 2013-12-13: 25 mg via ORAL
  Filled 2013-12-13: qty 1

## 2013-12-13 MED ORDER — VERAPAMIL HCL ER 240 MG PO TBCR
240.0000 mg | EXTENDED_RELEASE_TABLET | ORAL | Status: DC
  Filled 2013-12-13 (×2): qty 1

## 2013-12-13 MED ORDER — APIXABAN 5 MG PO TABS
5.0000 mg | ORAL_TABLET | Freq: Once | ORAL | Status: AC
  Administered 2013-12-13: 5 mg via ORAL
  Filled 2013-12-13: qty 1

## 2013-12-13 MED ORDER — METOPROLOL SUCCINATE ER 100 MG PO TB24
100.0000 mg | ORAL_TABLET | Freq: Every day | ORAL | Status: DC
  Administered 2013-12-13 – 2013-12-14 (×2): 100 mg via ORAL
  Filled 2013-12-13 (×2): qty 1

## 2013-12-13 MED ORDER — APIXABAN 5 MG PO TABS
5.0000 mg | ORAL_TABLET | Freq: Two times a day (BID) | ORAL | Status: DC
  Administered 2013-12-14: 5 mg via ORAL
  Filled 2013-12-13 (×2): qty 1

## 2013-12-13 NOTE — Progress Notes (Signed)
[facility] PROGRESS NOTE  [name] Moreno DGL:[id number] DOB: 02-03-1961 DOA: 12/11/2013 PCP: [name], MD  Assessment/Plan: 1-Small bowel obstruction: patient with hx of 3 abd surgery in the past and CT scan showing early high grade SBO on admission; most likely due to adhesions.  -NGT removed. Will advance diet and resume PO meds; if tolerated home in am -continue antiemetics and pain meds as needed  -IVF's change to NSL -close follow up of electrolytes  -had hx of significant amount of loperamide use on daily bases and for the last 2 weeks ongoing constipation (had 3 BM overnight) -advance diet to full liquid and resume PO home meds -start miralax   2-Essential hypertension, benign: resume home regimen  3-Systolic and diastolic heart failure, chronic: compensated.  -will follow close Intake and output  -daily weight  -denies SOB or orthopnea  4-History of ventricular fibrillation: rate controlled.  -tolerating sips CLD and meds. -will advance diet and resume home metoprolol and verapamil  5-CKD (chronic kidney disease) stage 3, GFR 30-59 ml/min: baseline Cr 1.6  -stable and at baseline -will monitor  6-Polycythemia vera(238.4): stable. Will continue follow up with Dr. Juliann Mule at cancer center in outpatient setting  -Hgb 17.   7-DVT (deep venous thrombosis): heparin per pharmacy and will transition to PO eliquis today.  8-GERD: continue PPI will transition to PO.   Code Status: Full Family Communication: fiance at bedside  Disposition Plan: home when medically stable; Will advance diet to full liquid, switch meds to PO and if tolerated everything will d/co home in am  Consultants:  General surgery  Procedures:  See below for x-ray reports  Antibiotics:  None   HPI/Subjective: Afebrile, feeling better. Positive BM overnight X 3. No abd pain. Tolerated sips clear.   Objective: Filed Vitals:   12/13/13 0523  BP: 133/81  Pulse: 69  Temp:    Resp:     Intake/Output Summary (Last 24 hours) at 12/13/13 1049 Last data filed at 12/13/13 0816  Gross per 24 hour  Intake 2159.62 ml  Output   1426 ml  Net 733.62 ml   Filed Weights   12/11/13 0434 12/12/13 0348 12/13/13 0403  Weight: 82.555 kg (182 lb) 79.6 kg (175 lb 7.8 oz) 78.7 kg (173 lb 8 oz)    Exam:   General:  Feeling better, complaining of sore throat due to NGT  Cardiovascular: S1 and S2, no rubs or gallops; soft SEM  Respiratory: CTA bilaterally  Abdomen: no abd pain, positive BM X 3, good BS, tolerating sips of CLD  Musculoskeletal: no edema, FROM  Data Reviewed: Basic Metabolic Panel:  Recent Labs Lab 12/11/13 0545 12/11/13 0552 12/11/13 1828 12/12/13 0540  NA 142 139  --  141  K 4.5 4.2  --  4.2  CL 102 103  --  103  CO2 22  --   --  26  GLUCOSE 95 98  --  101*  BUN 18 22  --  15  CREATININE 1.58* 1.70*  --  1.47*  CALCIUM 9.7  --   --  8.6  MG  --   --  1.8  --   PHOS  --   --  3.5  --    Liver Function Tests:  Recent Labs Lab 12/11/13 0545  AST 43*  ALT 39  ALKPHOS 81  BILITOT 0.2*  PROT 7.5  ALBUMIN 3.9   CBC:  Recent Labs Lab 12/11/13 0530 12/11/13 0552 12/12/13 0540  WBC 6.0  --  4.5  NEUTROABS 4.6  --   --   HGB 16.7 17.3* 15.1  HCT 46.1 51.0 43.3  MCV 89.7  --  91.0  PLT 76*  --  71*   BNP (last 3 results)  Recent Labs  05/26/13 2034 09/19/13 1412 09/28/13 0512  PROBNP 514.6* 518.2* 1131.0*    Studies: Dg Abd 2 Views  12/13/2013   CLINICAL DATA:  Small bowel obstruction.  EXAM: ABDOMEN - 2 VIEW  COMPARISON:  12/12/2013 and 12/11/2013 and CT scan dated 12/11/2013  FINDINGS: There is a single loop of slightly prominent small bowel in the mid abdomen. There is decreased air throughout the bowel. Colon is not distended. Decreased stool in the colon.  Tip of the NG tube is just beyond the gastroesophageal junction at the fundus of the stomach.  IMPRESSION: Single loop of slightly prominent small bowel in the  right mid abdomen. Almost complete resolution of small bowel obstructive pattern.   Electronically Signed   By: Rozetta Nunnery M.D.   On: 12/13/2013 07:48   Dg Abd 2 Views  12/12/2013   CLINICAL DATA:  Abdominal pain, partial small bowel obstruction  EXAM: ABDOMEN - 2 VIEW  COMPARISON:  CT ABD/PELV WO CM dated 12/11/2013; DG ABD ACUTE W/CHEST dated 12/11/2013  FINDINGS: Compared to the plain film of 12/11/2013, there is improvement in the small bowel obstruction pattern. There are no remaining air-fluid levels within the bowel. There is gas and stool throughout the colon small a gas in the sigmoid colon. Single dilated loop of small bowel in the left upper quadrant.  IMPRESSION: Considerable improvement in small bowel obstruction pattern.   Electronically Signed   By: Suzy Bouchard M.D.   On: 12/12/2013 08:08    Scheduled Meds: . metoprolol succinate  100 mg Oral Daily  . pantoprazole  40 mg Oral Q1200  . senna-docusate  1 tablet Oral BID  . sodium chloride  3 mL Intravenous Q12H  . verapamil  240 mg Oral [id number]   Continuous Infusions: . heparin 1,400 Units/hr (12/13/13 0622)    Time spent: >30 minutes   Chandell Attridge  [facility] Pager [phone]. If 7PM-7AM, please contact night-coverage at [email or web address], password [facility] 12/13/2013, 10:49 AM  LOS: 2 days

## 2013-12-13 NOTE — Progress Notes (Addendum)
ANTICOAGULATION CONSULT NOTE - Follow Up Consult  Pharmacy Consult for Heparin Indication: History DVT  Allergies  Allergen Reactions  . Penicillins Hives and Shortness Of Breath  . Shellfish Allergy Shortness Of Breath  . Iodine Hives  . Iohexol      Desc: hives,throat,lip swelling kdean   . Milk-Related Compounds Diarrhea  . Vitamin K And Related Hives   Patient Measurements: Height: 5\' 6"  (167.6 cm) Weight: 173 lb 8 oz (78.7 kg) IBW/kg (Calculated) : 63.8  Vital Signs: Temp: 97.9 F (36.6 C) (01/30 1408) Temp src: Oral (01/30 1408) BP: 130/77 mmHg (01/30 1408) Pulse Rate: 79 (01/30 1408)  Labs:  Recent Labs  12/11/13 0530 12/11/13 0545 12/11/13 0552  12/12/13 0059 12/12/13 0540 12/12/13 1315 12/13/13 0355 12/13/13 1242  HGB 16.7  --  17.3*  --   --  15.1  --   --   --   HCT 46.1  --  51.0  --   --  43.3  --   --   --   PLT 76*  --   --   --   --  71*  --   --   --   APTT  --   --   --   < > 70*  --  98* 66*  --   HEPARINUNFRC  --   --   --   < > 0.64  --  0.56 <0.10* 0.71*  CREATININE  --  1.58* 1.70*  --   --  1.47*  --   --   --   < > = values in this interval not displayed.  Estimated Creatinine Clearance: 58.7 ml/min (by C-G formula based on Cr of 1.47).  Medications:  Scheduled:  . metoprolol succinate  100 mg Oral Daily  . pantoprazole  40 mg Oral Q1200  . senna-docusate  1 tablet Oral BID  . sodium chloride  3 mL Intravenous Q12H  . verapamil  240 mg Oral Daily   Assessment: 53yo male on Heparin bridge while Apixaban on hold for SBO.  Heparin level 0.71  No CBC drawn today.  No bleeding problems noted.  Goal of Therapy:  Heparin level 0.3-0.7 units/ml Monitor platelets by anticoagulation protocol: Yes   Plan:  1.  Decrease Heparin to 1350 uits/hr 2.  Check CBC and HL in 6hr 3.  F/U plan to resume Apixaban  Gracy Bruins, PharmD Clinical Pharmacist [facility]  Addendum: Heparin now being changed to  [place] which was his home medication regimen.  Will start Apixaban 5mg  twice daily and discontinue IV heparin and labs.  Rober Minion, PharmD., MS Clinical Pharmacist Pager:  [phone] Thank you for allowing pharmacy to be part of this patients care team.

## 2013-12-13 NOTE — Progress Notes (Signed)
ANTICOAGULATION CONSULT NOTE - Follow Up Consult  Pharmacy Consult for heparin Indication: h/o DVT  Labs:  Recent Labs  12/11/13 0530 12/11/13 0545 12/11/13 0552  12/12/13 0059 12/12/13 0540 12/12/13 1315 12/13/13 0355  HGB 16.7  --  17.3*  --   --  15.1  --   --   HCT 46.1  --  51.0  --   --  43.3  --   --   PLT 76*  --   --   --   --  71*  --   --   APTT  --   --   --   < > 70*  --  98* 66*  HEPARINUNFRC  --   --   --   < > 0.64  --  0.56 <0.10*  CREATININE  --  1.58* 1.70*  --   --  1.47*  --   --   < > = values in this interval not displayed.   Assessment: 53yo male now undetectable on heparin after two levels at goal though had been trending down and Eliquis may not have fully cleared; no gtt issues per RN.  Goal of Therapy:  Heparin level 0.3-0.7 units/ml   Plan:  Will bolus with 2000 units and increase gtt by 4 units/kg/hr to 1400 units/hr and check level in 6hr.  Wynona Neat, PharmD, BCPS  12/13/2013,6:17 AM
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
## 2013-12-13 NOTE — Progress Notes (Signed)
Bowel obstruction seems to have resolved.  We will sign off.  Callus again if patient should have recurrent symptoms.  Advance diet slowly.  Kathryne Eriksson. Dahlia Bailiff, MD, [place] [phone] [facility] Surgery

## 2013-12-14 ENCOUNTER — Encounter (HOSPITAL_COMMUNITY): Payer: Self-pay

## 2013-12-14 DIAGNOSIS — Z8679 Personal history of other diseases of the circulatory system: Secondary | ICD-10-CM

## 2013-12-14 MED ORDER — NAPROXEN SODIUM 220 MG PO TABS: 220.0000 mg | ORAL_TABLET | Freq: Two times a day (BID) | ORAL | Status: DC | PRN

## 2013-12-14 MED ORDER — POLYETHYLENE GLYCOL 3350 17 G PO PACK: 17.0000 g | PACK | Freq: Every day | ORAL | Status: DC | PRN

## 2013-12-14 MED ORDER — LOPERAMIDE HCL 2 MG PO CAPS: 2.0000 mg | ORAL_CAPSULE | Freq: Four times a day (QID) | ORAL | Status: DC | PRN

## 2013-12-14 NOTE — Plan of Care (Signed)
Problem: Food- and Nutrition-Related Knowledge Deficit (NB-1.1) Goal: Nutrition education Formal process to instruct or train a patient/client in a skill or to impart knowledge to help patients/clients voluntarily manage or modify food choices and eating behavior to maintain or improve health. Outcome: Completed/Met Date Met:  12/14/13 RD received page from RN regarding low residue diet education.  Patient admitted with partial bowel obstruction. High grade SBO likely due to adhesions. He has a history of 3 abdominal surgeries, including partial colectomy.   Diet: Dysphagia 3, thin liquids, eating ~100% of meals.   Intervention: Patient educated on a low fiber, low fat diet. We reviewed foods low and high in fiber. We also discussed choosing low fat options. Patient with questions regarding food choices while dining out. Handout provided. Teach back method used.     Wt Readings from Last 10 Encounters:  12/14/13 173 lb 4.8 oz (78.608 kg)  12/10/13 181 lb 12.8 oz (82.464 kg)  11/04/13 190 lb (86.183 kg)  11/01/13 184 lb 12.8 oz (83.825 kg)  11/01/13 184 lb (83.462 kg)  10/15/13 182 lb (82.555 kg)  10/08/13 182 lb 4.8 oz (82.691 kg)  10/04/13 184 lb 14.4 oz (83.87 kg)  10/01/13 185 lb (83.915 kg)  09/26/13 184 lb (83.462 kg)   Body mass index is 27.98 kg/(m^2). Patient is overweight.   Larey Seat, RD, LDN Pager #: [phone] After-Hours Pager #: [phone]

## 2013-12-14 NOTE — Discharge Instructions (Signed)
Small Bowel Obstruction A small bowel obstruction is a blockage (obstruction) of the small intestine (small bowel). The small bowel is a long, slender tube that connects the stomach to the colon. Its job is to absorb nutrients from the fluids and foods you consume into the bloodstream.  CAUSES  There are many causes of intestinal blockage. The most common ones include:  Hernias. This is a more common cause in children than adults.  Inflammatory bowel disease (enteritis and colitis).  Twisting of the bowel (volvulus).  Tumors.  Scar tissue (adhesions) from previous surgery or radiation treatment.  Recent surgery. This may cause an acute small bowel obstruction called an ileus. SYMPTOMS   Abdominal pain. This may be dull cramps or sharp pain. It may occur in one area or may be present in the entire abdomen. Pain can range from mild to severe, depending on the degree of obstruction.  Nausea and vomiting. Vomit may be greenish or yellow bile color.  Distended or swollen stomach. Abdominal bloating is a common symptom.  Constipation.  Lack of passing gas.  Frequent belching.  Diarrhea. This may occur if runny stool is able to leak around the obstruction. DIAGNOSIS  Your caregiver can usually diagnose small bowel obstruction by taking a history, doing a physical exam, and taking X-rays. If the cause is unclear, a CT scan (computerized tomography) of your abdomen and pelvis may be needed. TREATMENT  Treatment of the blockage depends on the cause and how bad the problem is.   Sometimes, the obstruction improves with bed rest and intravenous (IV) fluids.  Resting the bowel is very important. This means following a simple diet. Sometimes, a clear liquid diet may be required for several days.  Sometimes, a small tube (nasogastric tube) is placed into the stomach to decompress the bowel. When the bowel is blocked, it usually swells up like a balloon filled with air and fluids.  Decompression means that the air and fluids are removed by suction through that tube. This can help with pain, discomfort, and nausea. It can also help the obstruction resolve faster.  Surgery may be required if other treatments do not work. Bowel obstruction from a hernia may require early surgery and can be an emergency procedure. Adhesions that cause frequent or severe obstructions may also require surgery. HOME CARE INSTRUCTIONS If your bowel obstruction is only partial or incomplete, you may be allowed to go home.  Get plenty of rest.  Follow your diet as directed by your caregiver. SEEK IMMEDIATE MEDICAL CARE IF:  You have increased pain or cramping.  You vomit blood.  You have uncontrolled vomiting or nausea.  You cannot drink fluids due to vomiting or pain.  You develop confusion.  You begin feeling very dry or thirsty (dehydrated).  You have severe bloating.  You have chills.  You have a fever.  You feel extremely weak or you faint. MAKE SURE YOU:  Understand these instructions.  Will watch your condition.  Will get help right away if you are not doing well or get worse. Document Released: 01/17/2006 Document Revised: 01/23/2012 Document Reviewed: 01/14/2011 [facility] Patient Information 2014 [place].

## 2013-12-14 NOTE — Discharge Summary (Signed)
Physician Discharge Summary  [name] Moreno [id number] DOB: 1961-01-02 DOA: 12/11/2013  PCP: [name], MD  Admit date: 12/11/2013 Discharge date: 12/14/2013  Time spent: >30 minutes  Recommendations for Outpatient Follow-up:  1. BMET to follow electrolytes and renal function  Discharge Diagnoses:  Principal Problem:   Partial bowel obstruction Active Problems:   Essential hypertension, benign   DIASTOLIC HEART FAILURE, CHRONIC   History of ventricular fibrillation   CKD (chronic kidney disease) stage 3, GFR 30-59 ml/min   Polycythemia vera(238.4)   DVT (deep venous thrombosis)   Partial small bowel obstruction   Discharge Condition: stable and improved. Will discharge home. Patient to follow with PCP in 2 weeks  Diet recommendation: low sodium/heart healthy and low residue diet  Filed Weights   12/12/13 0348 12/13/13 0403 12/14/13 0625  Weight: 79.6 kg (175 lb 7.8 oz) 78.7 kg (173 lb 8 oz) 78.608 kg (173 lb 4.8 oz)    History of present illness:  54 y.o. male with pmh as mentioned below but significant for combined systolic heart failure (last echo Nov 2014 with EF 45%); HTN, PV, hx of DVT and ventricular fibrillation; came to ED complaining of abd pain, nausea and vomiting. Patient reported hx of 3 abd surgeries in the past and abd CT in the ED demonstrated early high grade SBO most likely due to adhesions. Surgery has been consulted and for now plan is conservative management; given chronic medical problems IM has been called to admit patient and assist with evaluation and treatment.  Patient denies SOB, CP, hematemesis, melena, hematochezia, HA's, blurred vision or any other acute complaints.   Hospital Course:  1-Small bowel obstruction (partial): patient with hx of 3 abd surgery in the past and CT scan showing partial to early high grade SBO on admission; most likely due to adhesions.  -treated conservatively with NGT, IVF's, supportive care and  dulcolax -bowel obstruction resolved and NGT removed.  -will discharge on low residue diet and instruction to avoid excessive use of loperamide -miralax daily to help with regular bowel transit  -continue antiemetics and pain meds as needed  -at discharge no N/V or significant pain.  2-Essential hypertension, benign: well controlled with home medication regimen. -advise to follow low sodium/heart healthy diet  3-Systolic and diastolic heart failure, chronic: compensated.  -denies SOB or orthopnea  -continue low sodium heart healthy diet -currently on PRN lasix only  4-History of ventricular fibrillation: rate controlled.  -continue verapamil and metoprolol -continue also eliquis -status post ICD  5-CKD (chronic kidney disease) stage 3, GFR 30-59 ml/min: baseline Cr 1.4-1.6  -stable and at baseline   6-Polycythemia vera(238.4): stable. Will continue follow up with Dr. Juliann Mule at cancer center in outpatient setting  -Hgb 17.   7-DVT (deep venous thrombosis): continue eliquis  8-GERD: continue PPI  Procedures:  See below for x-ray reports   Consultations:  General surgery   Discharge Exam: Filed Vitals:   12/14/13 0900  BP: 107/72  Pulse: 89  Temp: 97.7 F (36.5 C)  Resp: 18   General: Feeling better, NAD, no N/V or significant abd pain  Cardiovascular: S1 and S2, no rubs or gallops; soft SEM Respiratory: CTA bilaterally  Abdomen: no abd pain, positive BM, good BS, tolerating full liquid diet and low residue diet Musculoskeletal: no edema, FROM  Discharge Instructions  Discharge Orders   Future Appointments Provider Department Dept Phone   01/07/2014 10:00 AM [facility] [phone]   01/07/2014 10:30 AM  [facility] Covering Provider [facility] [phone]   01/07/2014 11:30 AM [facility] [phone]   03/13/2014 10:20 AM [facility] [place] [facility] [phone]   Future Orders Complete By Expires   Diet - low sodium heart healthy  As directed    Discharge instructions  As directed    Comments:     Take medications as prescribed Please maintain yourself well hydrated Follow instructions for low residue diet Avoid increased use of imodium  Naproxen use changed to as needed       Medication List         apixaban 5 MG Tabs tablet  Commonly known as:  ELIQUIS  Take 1 tablet (5 mg total) by mouth 2 (two) times daily.     furosemide 40 MG tablet  Commonly known as:  LASIX  Take 40 mg by mouth daily as needed for fluid.     loperamide 2 MG capsule  Commonly known as:  IMODIUM  Take 1 capsule (2 mg total) by mouth every 6 (six) hours as needed (for severe diarrhea).     meclizine 25 MG tablet  Commonly known as:  ANTIVERT  Take 25 mg by mouth 3 (three) times daily as needed for dizziness.     metoprolol succinate 100 MG 24 hr tablet  Commonly known as:  TOPROL-XL  Take 100 mg by mouth daily. Take with or immediately following a meal.     naproxen sodium 220 MG tablet  Commonly known as:  ANAPROX  Take 1 tablet (220 mg total) by mouth every 12 (twelve) hours as needed (pain).     pantoprazole 40 MG tablet  Commonly known as:  PROTONIX  Take 40 mg by mouth daily.     polyethylene glycol packet  Commonly known as:  MIRALAX / GLYCOLAX  Take 17 g by mouth daily as needed for mild constipation or moderate constipation.     potassium chloride SA 20 MEQ tablet  Commonly known as:  K-DUR,KLOR-CON  Take 20 mEq by mouth daily as needed (taken with Lasix).     verapamil 240 MG CR tablet  Commonly known as:  CALAN-SR  Take 240 mg by mouth every morning.       Allergies  Allergen Reactions  . Penicillins Hives and Shortness Of Breath  . Shellfish Allergy Shortness Of Breath  . Iodine Hives  . Iohexol      Desc: hives,throat,lip swelling kdean   . Milk-Related Compounds  Diarrhea  . Vitamin K And Related Hives       Follow-up Information   Follow up with [name], MD. Schedule an appointment as soon as possible for a visit in 2 weeks.   Specialty:  Family Medicine   Contact information:   [place] [address] [phone]        The results of significant diagnostics from this hospitalization (including imaging, microbiology, ancillary and laboratory) are listed below for reference.    Significant Diagnostic Studies: Ct Abdomen Pelvis Wo Contrast  12/11/2013   CLINICAL DATA:  Abdominal pain.  Nausea and vomiting.  EXAM: CT ABDOMEN AND PELVIS WITHOUT CONTRAST  TECHNIQUE: Multidetector CT imaging of the abdomen and pelvis was performed following the standard protocol without intravenous contrast.  COMPARISON:  CT of the abdomen and pelvis 08/29/2010.  FINDINGS: Lung Bases: Multifocal interstitial and airspace opacities throughout the left lower lobe, concerning for sequela of mild aspiration  in this patient with history of vomiting. Cardiomegaly. Pacemaker/ AICD lead tip terminating in the apex of the right ventricle. Abandoned epicardial pacing wire also noted. Nasogastric tube extends into the mid stomach.  Abdomen/Pelvis: The unenhanced appearance of the liver, gallbladder, pancreas, spleen, bilateral adrenal glands and the right kidney is unremarkable. Status post left nephrectomy. No significant volume of ascites. No pneumoperitoneum. Prostate gland and urinary bladder are unremarkable in appearance.  Some gas and stool is noted throughout the colon. However, there are multiple dilated loops of small bowel measuring up to 5.7 cm in the right lower quadrant, likely within the mid to distal ileum. Multiple air-fluid levels are present within the dilated loops of small bowel. There are postoperative changes associated with multiple loops of small bowel, presumably from prior partial bowel resection. Angulation of several loops  of small bowel are noted, as demonstrated on image 45 of series 2, with there is focal narrowing and probable small bowel wall thickening. Immediately distal to this area of angulation and bowel wall thickening, the bowel remains slightly dilated, but the more distal small bowel is relatively decompressed. Likewise, the duodenum and proximal jejunum are not dilated.  Musculoskeletal: Postoperative changes in the subcutaneous fat of the upper abdomen in the midline. There are no aggressive appearing lytic or blastic lesions noted in the visualized portions of the skeleton.  IMPRESSION: 1. Unusual pattern of the small bowel dilatation throughout the mid small bowel, with multiple angulated loops of bowel, suggesting the presence of adhesions. There is also likely some small bowel wall thickening (difficult to judge on today's non contrast CT examination), as discussed above. Given the lack or of significant duodenal and proximal jejunal dilatation, and the presence of a significant volume of gas and stool throughout the colon, findings are not favored to represent a complete bowel obstruction at this time. However, early or partial small bowel obstruction is not excluded. Clinical correlation is recommended. 2. Patchy interstitial and airspace disease in the left lower lobe may represent sequela of recent aspiration in this patient with history of vomiting. 3. Cardiomegaly.   Electronically Signed   By: Vinnie Langton M.D.   On: 12/11/2013 10:11   Dg Abd 2 Views  12/13/2013   CLINICAL DATA:  Small bowel obstruction.  EXAM: ABDOMEN - 2 VIEW  COMPARISON:  12/12/2013 and 12/11/2013 and CT scan dated 12/11/2013  FINDINGS: There is a single loop of slightly prominent small bowel in the mid abdomen. There is decreased air throughout the bowel. Colon is not distended. Decreased stool in the colon.  Tip of the NG tube is just beyond the gastroesophageal junction at the fundus of the stomach.  IMPRESSION: Single loop of  slightly prominent small bowel in the right mid abdomen. Almost complete resolution of small bowel obstructive pattern.   Electronically Signed   By: Rozetta Nunnery M.D.   On: 12/13/2013 07:48   Dg Abd 2 Views  12/12/2013   CLINICAL DATA:  Abdominal pain, partial small bowel obstruction  EXAM: ABDOMEN - 2 VIEW  COMPARISON:  CT ABD/PELV WO CM dated 12/11/2013; DG ABD ACUTE W/CHEST dated 12/11/2013  FINDINGS: Compared to the plain film of 12/11/2013, there is improvement in the small bowel obstruction pattern. There are no remaining air-fluid levels within the bowel. There is gas and stool throughout the colon small a gas in the sigmoid colon. Single dilated loop of small bowel in the left upper quadrant.  IMPRESSION: Considerable improvement in small bowel obstruction pattern.  Electronically Signed   By: Suzy Bouchard M.D.   On: 12/12/2013 08:08   Dg Abd Acute W/chest  12/11/2013   CLINICAL DATA:  Abdominal pain and distention  EXAM: ACUTE ABDOMEN SERIES (ABDOMEN 2 VIEW & CHEST 1 VIEW)  COMPARISON:  09/28/2013 chest radiograph  FINDINGS: Prominent cardiomediastinal contours are similar to prior. Mild retrocardiac opacity. Right chest wall battery pack with unchanged lead tip position. Unchanged isolated curvilinear wires projecting over the inferior heart margin/epicardium.  Gas and fluid distention of stomach and proximal small bowel loops with air-fluid levels. There is some air and stool noted within colon. Surgical clip left upper quadrant. No definite free intraperitoneal air. No acute osseous finding.  IMPRESSION: Nonspecific bowel gas pattern. Early or partial obstruction not excluded. Recommend CT.  Mild retrocardiac opacity; atelectasis/scar versus infiltrate.   Electronically Signed   By: Atthew Coutant Levering M.D.   On: 12/11/2013 06:21   Labs: Basic Metabolic Panel:  Recent Labs Lab 12/11/13 0545 12/11/13 0552 12/11/13 1828 12/12/13 0540  NA 142 139  --  141  K 4.5 4.2  --  4.2  CL 102 103   --  103  CO2 22  --   --  26  GLUCOSE 95 98  --  101*  BUN 18 22  --  15  CREATININE 1.58* 1.70*  --  1.47*  CALCIUM 9.7  --   --  8.6  MG  --   --  1.8  --   PHOS  --   --  3.5  --    Liver Function Tests:  Recent Labs Lab 12/11/13 0545  AST 43*  ALT 39  ALKPHOS 81  BILITOT 0.2*  PROT 7.5  ALBUMIN 3.9   CBC:  Recent Labs Lab 12/11/13 0530 12/11/13 0552 12/12/13 0540 12/13/13 2100  WBC 6.0  --  4.5 4.4  NEUTROABS 4.6  --   --   --   HGB 16.7 17.3* 15.1 15.5  HCT 46.1 51.0 43.3 43.7  MCV 89.7  --  91.0 91.4  PLT 76*  --  71* 83*   BNP: BNP (last 3 results)  Recent Labs  05/26/13 2034 09/19/13 1412 09/28/13 0512  PROBNP 514.6* 518.2* 1131.0*   Signed:  Neilah Fulwider  [facility] 12/14/2013, 11:03 AM

## 2013-12-17 NOTE — ED Provider Notes (Signed)
Medical screening examination/treatment/procedure(s) were conducted as a shared visit with non-physician practitioner(s) and myself.  I personally evaluated the patient during the encounter.     Kalman Drape, MD 12/17/13 [phone]

## 2013-12-24 ENCOUNTER — Ambulatory Visit (INDEPENDENT_AMBULATORY_CARE_PROVIDER_SITE_OTHER): Payer: BC Managed Care – PPO | Admitting: Family Medicine

## 2013-12-24 ENCOUNTER — Encounter: Payer: Self-pay | Admitting: Family Medicine

## 2013-12-24 VITALS — BP 122/84 | HR 68 | Wt 182.0 lb

## 2013-12-24 DIAGNOSIS — Z8719 Personal history of other diseases of the digestive system: Secondary | ICD-10-CM

## 2013-12-24 DIAGNOSIS — N289 Disorder of kidney and ureter, unspecified: Secondary | ICD-10-CM

## 2013-12-24 LAB — COMPREHENSIVE METABOLIC PANEL
ALT: 26 U/L (ref 0–53)
AST: 29 U/L (ref 0–37)
Albumin: 3.8 g/dL (ref 3.5–5.2)
Alkaline Phosphatase: 64 U/L (ref 39–117)
BILIRUBIN TOTAL: 0.4 mg/dL (ref 0.2–1.2)
BUN: 11 mg/dL (ref 6–23)
CO2: 25 meq/L (ref 19–32)
CREATININE: 1.17 mg/dL (ref 0.50–1.35)
Calcium: 8.6 mg/dL (ref 8.4–10.5)
Chloride: 103 mEq/L (ref 96–112)
Glucose, Bld: 62 mg/dL — ABNORMAL LOW (ref 70–99)
Potassium: 3.9 mEq/L (ref 3.5–5.3)
SODIUM: 137 meq/L (ref 135–145)
Total Protein: 6.5 g/dL (ref 6.0–8.3)

## 2013-12-24 NOTE — Progress Notes (Signed)
   Subjective:    Patient ID: [name] Moreno, male    DOB: 1960/12/17, 53 y.o.   MRN: [id number]  HPI He is here for a followup after recent hospitalization. He was found to have a small bowel obstruction. Since leaving the hospital he has had no abdominal pain, nausea, vomiting or change in bowel habits. He states that his back pain is actually feeling better. He also is about 6 months since being diagnosed with DVT. While in the hospital he was noted to have some abnormal electrolytes and renal functions and needs followup.   Review of Systems     Objective:   Physical Exam Alert and in no distress. Abdominal exam shows no masses or tenderness.       Assessment & Plan:  History of small bowel obstruction - Plan: Comprehensive metabolic panel  Abnormal renal function - Plan: Comprehensive metabolic panel  review his record indicates he has been on DVT prophylaxis for approximately 6 months. When he finishes this course of his medication, he will stop the Eliquis.

## 2013-12-25 ENCOUNTER — Telehealth: Payer: Self-pay | Admitting: Family Medicine

## 2013-12-25 DIAGNOSIS — D751 Secondary polycythemia: Secondary | ICD-10-CM

## 2013-12-25 NOTE — Telephone Encounter (Signed)
Dr. Arletha Pili (hematology/oncology at [facility])  Called concerning the diagnosis on this patient and would like you to call him. He states patient was misdiagnosed and a totally different work up needs to be done and he would like to discuss this with you  Office   [phone] Cell  [phone]

## 2013-12-27 NOTE — Telephone Encounter (Signed)
The patient received a second opinion from Dr. testicles and was found to have an elevated erythropoietin level of 80.1. He needs to be evaluated for the primary cause.

## 2013-12-27 NOTE — Telephone Encounter (Signed)
Please handle

## 2013-12-27 NOTE — Telephone Encounter (Signed)
Set up the CT scan of abdomen and pelvis with IV contrast and make sure they know he has one kidney

## 2013-12-27 NOTE — Telephone Encounter (Signed)
Called to get a pre-cert for pt to get CT abd w/o contrast. I have to wait to hear back from them to see if it has been approved or they need additional info.  Pre cert phone # [phone]. Tracking # [id number]. I am trying to get it approved for [address] [place] [id number].

## 2014-01-02 NOTE — Telephone Encounter (Signed)
i have called to get notes from Dr. Harvest Dark office because his insurance in wanting more info with labs and ov notes to try to get it approved.

## 2014-01-06 ENCOUNTER — Encounter: Payer: Self-pay | Admitting: Family Medicine

## 2014-01-06 ENCOUNTER — Ambulatory Visit (INDEPENDENT_AMBULATORY_CARE_PROVIDER_SITE_OTHER): Payer: BC Managed Care – PPO | Admitting: Family Medicine

## 2014-01-06 ENCOUNTER — Ambulatory Visit
Admission: RE | Admit: 2014-01-06 | Discharge: 2014-01-06 | Disposition: A | Discharge status: Home / Self Care | Payer: MEDICARE | Source: Ambulatory Visit | Attending: Family Medicine | Admitting: Family Medicine

## 2014-01-06 VITALS — BP 140/90 | HR 60 | Wt 178.0 lb

## 2014-01-06 DIAGNOSIS — Z86718 Personal history of other venous thrombosis and embolism: Secondary | ICD-10-CM

## 2014-01-06 DIAGNOSIS — M79604 Pain in right leg: Secondary | ICD-10-CM

## 2014-01-06 DIAGNOSIS — M79609 Pain in unspecified limb: Secondary | ICD-10-CM

## 2014-01-06 DIAGNOSIS — N289 Disorder of kidney and ureter, unspecified: Secondary | ICD-10-CM

## 2014-01-06 LAB — ELECTROLYTE PANEL
CO2: 24 meq/L (ref 19–32)
Chloride: 106 mEq/L (ref 96–112)
Potassium: 4.2 mEq/L (ref 3.5–5.3)
SODIUM: 139 meq/L (ref 135–145)

## 2014-01-06 NOTE — Progress Notes (Signed)
   Subjective:    Patient ID: [name] Moreno, male    DOB: [birth year], 53 y.o.   MRN: [id number]  HPI He is here for evaluation of a two-day history of right lateral calf pain. He describes this sensation is the same as when he was diagnosed with DVT and he is worried about this. He has had no chest pain, shortness of breath or coughing. He also is in the process of having a CT set up for further evaluation of his elevated EPO. He is scheduled to see his hematologist tomorrow.   Review of Systems     Objective:   Physical Exam Alert and in no distress. Exam of the right lower extremity shows a negative Homans sign. No palpable tenderness swelling or erythema is noted.       Assessment & Plan:  Abnormal renal function - Plan: Electrolyte panel  History of DVT (deep vein thrombosis) - Plan: CANCELED: Lower Extremity Venous Duplex Right  Leg pain, right - Plan: US Venous Img Lower Unilateral Right, CANCELED: Lower Extremity Venous Duplex Right  the Doppler study was negative. He was informed of this and recommend treating the pain symptomatically.

## 2014-01-06 NOTE — Telephone Encounter (Signed)
i have faxed everything i had back to insurance for approval

## 2014-01-07 ENCOUNTER — Ambulatory Visit (HOSPITAL_BASED_OUTPATIENT_CLINIC_OR_DEPARTMENT_OTHER): Payer: MEDICARE

## 2014-01-07 ENCOUNTER — Other Ambulatory Visit (HOSPITAL_BASED_OUTPATIENT_CLINIC_OR_DEPARTMENT_OTHER): Payer: MEDICARE

## 2014-01-07 ENCOUNTER — Ambulatory Visit (HOSPITAL_BASED_OUTPATIENT_CLINIC_OR_DEPARTMENT_OTHER): Payer: MEDICARE | Admitting: Internal Medicine

## 2014-01-07 ENCOUNTER — Encounter: Payer: Self-pay | Admitting: Internal Medicine

## 2014-01-07 ENCOUNTER — Telehealth: Payer: Self-pay | Admitting: Internal Medicine

## 2014-01-07 ENCOUNTER — Telehealth: Payer: Self-pay | Admitting: *Deleted

## 2014-01-07 VITALS — BP 121/97 | HR 72

## 2014-01-07 VITALS — BP 146/85 | HR 68 | Temp 97.2°F | Resp 20 | Ht 66.0 in | Wt 182.3 lb

## 2014-01-07 DIAGNOSIS — D45 Polycythemia vera: Secondary | ICD-10-CM

## 2014-01-07 DIAGNOSIS — D751 Secondary polycythemia: Secondary | ICD-10-CM

## 2014-01-07 DIAGNOSIS — M549 Dorsalgia, unspecified: Secondary | ICD-10-CM

## 2014-01-07 DIAGNOSIS — I82409 Acute embolism and thrombosis of unspecified deep veins of unspecified lower extremity: Secondary | ICD-10-CM

## 2014-01-07 DIAGNOSIS — D696 Thrombocytopenia, unspecified: Secondary | ICD-10-CM

## 2014-01-07 DIAGNOSIS — I82401 Acute embolism and thrombosis of unspecified deep veins of right lower extremity: Secondary | ICD-10-CM

## 2014-01-07 LAB — COMPREHENSIVE METABOLIC PANEL (CC13)
ALBUMIN: 4.1 g/dL (ref 3.5–5.0)
ALT: 33 U/L (ref 0–55)
ANION GAP: 8 meq/L (ref 3–11)
AST: 34 U/L (ref 5–34)
Alkaline Phosphatase: 67 U/L (ref 40–150)
BUN: 14 mg/dL (ref 7.0–26.0)
CALCIUM: 9.3 mg/dL (ref 8.4–10.4)
CHLORIDE: 106 meq/L (ref 98–109)
CO2: 26 meq/L (ref 22–29)
Creatinine: 1.4 mg/dL — ABNORMAL HIGH (ref 0.7–1.3)
Glucose: 104 mg/dl (ref 70–140)
Potassium: 4.3 mEq/L (ref 3.5–5.1)
SODIUM: 140 meq/L (ref 136–145)
TOTAL PROTEIN: 7.3 g/dL (ref 6.4–8.3)
Total Bilirubin: 0.48 mg/dL (ref 0.20–1.20)

## 2014-01-07 LAB — CBC WITH DIFFERENTIAL/PLATELET
BASO%: 0.6 % (ref 0.0–2.0)
Basophils Absolute: 0 10*3/uL (ref 0.0–0.1)
EOS ABS: 0.1 10*3/uL (ref 0.0–0.5)
EOS%: 2.8 % (ref 0.0–7.0)
HEMATOCRIT: 49.2 % (ref 38.4–49.9)
HGB: 16.1 g/dL (ref 13.0–17.1)
LYMPH#: 1.1 10*3/uL (ref 0.9–3.3)
LYMPH%: 21.4 % (ref 14.0–49.0)
MCH: 31.1 pg (ref 27.2–33.4)
MCHC: 32.8 g/dL (ref 32.0–36.0)
MCV: 94.9 fL (ref 79.3–98.0)
MONO#: 0.4 10*3/uL (ref 0.1–0.9)
MONO%: 8.4 % (ref 0.0–14.0)
NEUT%: 66.8 % (ref 39.0–75.0)
NEUTROS ABS: 3.5 10*3/uL (ref 1.5–6.5)
Platelets: 102 10*3/uL — ABNORMAL LOW (ref 140–400)
RBC: 5.18 10*6/uL (ref 4.20–5.82)
RDW: 16.3 % — ABNORMAL HIGH (ref 11.0–14.6)
WBC: 5.3 10*3/uL (ref 4.0–10.3)

## 2014-01-07 NOTE — Telephone Encounter (Signed)
Called pt and left message regarding appt for March and April 2015 lab,md phlebotomy

## 2014-01-07 NOTE — Progress Notes (Signed)
500 gm blood phlebotomized from R antecubital from 1123 am to 1130 am using 16 g phlebotomy kit. Pt tolerated well. Snack given and 30 minute observation done.

## 2014-01-07 NOTE — Telephone Encounter (Signed)
Gave pt appt for lab and and MD emailed Sharyn Lull regarding phelbotomy for [facility] and April

## 2014-01-07 NOTE — Patient Instructions (Signed)

## 2014-01-07 NOTE — Telephone Encounter (Signed)
Per staff message and POF I have scheduled appts.  JMW  

## 2014-01-07 NOTE — Patient Instructions (Signed)
Therapeutic Phlebotomy Therapeutic phlebotomy is the controlled removal of blood from your body for the purpose of treating a medical condition. It is similar to donating blood. Usually, about a pint (470 mL) of blood is removed. The average adult has 9 to 12 pints (4.3 to 5.7 L) of blood. Therapeutic phlebotomy may be used to treat the following medical conditions:  Hemochromatosis. This is a condition in which there is too much iron in the blood.  Polycythemia vera. This is a condition in which there are too many red cells in the blood.  Porphyria cutanea tarda. This is a disease usually passed from one generation to the next (inherited). It is a condition in which an important part of hemoglobin is not made properly. This results in the build up of abnormal amounts of porphyrins in the body.  Sickle cell disease. This is an inherited disease. It is a condition in which the red blood cells form an abnormal crescent shape rather than a round shape. LET YOUR CAREGIVER KNOW ABOUT:  Allergies.  Medicines taken including herbs, eyedrops, over-the-counter medicines, and creams.  Use of steroids (by mouth or creams).  Previous problems with anesthetics or numbing medicine.  History of blood clots.  History of bleeding or blood problems.  Previous surgery.  Possibility of pregnancy, if this applies. RISKS AND COMPLICATIONS This is a simple and safe procedure. Problems are unlikely. However, problems can occur and may include:  Nausea or lightheadedness.  Low blood pressure.  Soreness, bleeding, swelling, or bruising at the needle insertion site.  Infection. BEFORE THE PROCEDURE  This is a procedure that can be done as an outpatient. Confirm the time that you need to arrive for your procedure. Confirm whether there is a need to fast or withhold any medications. It is helpful to wear clothing with sleeves that can be raised above the elbow. A blood sample may be done to determine the  amount of red blood cells or iron in your blood. Plan ahead of time to have someone drive you home after the procedure. PROCEDURE The entire procedure from preparation through recovery takes about 1 hour. The actual collection takes about 10 to 15 minutes.  A needle will be inserted into your vein.  Tubing and a collection bag will be attached to that needle.  Blood will flow through the needle and tubing into the collection bag.  You may be asked to open and close your hand slowly and continuously during the entire collection.  Once the specified amount of blood has been removed from your body, the collection bag and tubing will be clamped.  The needle will be removed.  Pressure will be held on the site of the needle insertion to stop the bleeding. Then a bandage will be placed over the needle insertion site. AFTER THE PROCEDURE  Your recovery will be assessed and monitored. If there are no problems, as an outpatient, you should be able to go home shortly after the procedure.  Document Released: 04/04/2011 Document Revised: 01/23/2012 Document Reviewed: 04/04/2011 [employer] Patient Information 2014 [employer].  
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
## 2014-01-13 ENCOUNTER — Telehealth: Payer: Self-pay | Admitting: Internal Medicine

## 2014-01-13 NOTE — Telephone Encounter (Signed)
Let him know that it was negative. Explain that I thought he was told that by the radiologist. Symptomatic care with Advil or Aleve and recheck here if continued difficulty

## 2014-01-13 NOTE — Telephone Encounter (Signed)
Pt coming in this Thursday @ 11:45

## 2014-01-13 NOTE — Telephone Encounter (Signed)
Pt wants to know his results for his Ultrasound of leg. Pt is still having a burning senation on his calf

## 2014-01-13 NOTE — Telephone Encounter (Signed)
Forwarding to [place] per [employer]

## 2014-01-14 ENCOUNTER — Ambulatory Visit (INDEPENDENT_AMBULATORY_CARE_PROVIDER_SITE_OTHER): Payer: BC Managed Care – PPO | Admitting: Family Medicine

## 2014-01-14 ENCOUNTER — Encounter: Payer: Self-pay | Admitting: Family Medicine

## 2014-01-14 VITALS — BP 130/84 | HR 72 | Wt 181.0 lb

## 2014-01-14 DIAGNOSIS — M545 Low back pain, unspecified: Secondary | ICD-10-CM

## 2014-01-14 NOTE — Progress Notes (Signed)
   Subjective:    Patient ID: [name] Moreno, male    DOB: Nov 02, 1961, 53 y.o.   MRN: [id number]  HPI He is here for followup visit. He continues to have intermittent burning/stinging sensation in the right calf he notices especially when he takes a shower. Recent Doppler study was negative. His main issue today is continued difficulty with right-sided back pain. He was involved in physical therapy before he was admitted to the hospital but has not been able to get back. He does complain of some pain radiating down his leg but cannot relate this to any particular physical activities. He was very difficult for him to stay on task and give me a coherent history. He complains of pain and then he would talk about numbness and tingling sensation.   Review of Systems     Objective:   Physical Exam Alert and in no distress. Exam of his back shows good lumbar motion with normal curve. He complains of pain in the right upper lumbar paravertebral muscles. No tenderness over SI joints or sciatic. Negative straight leg raising. Normal hip motion.       Assessment & Plan:  Low back pain - Plan: Ambulatory referral to Physical Therapy

## 2014-01-16 ENCOUNTER — Ambulatory Visit: Payer: Self-pay | Admitting: Family Medicine

## 2014-01-17 ENCOUNTER — Encounter: Payer: Self-pay | Admitting: Family Medicine

## 2014-01-30 ENCOUNTER — Ambulatory Visit: Payer: MEDICARE | Attending: Family Medicine

## 2014-01-30 DIAGNOSIS — IMO0001 Reserved for inherently not codable concepts without codable children: Secondary | ICD-10-CM | POA: Insufficient documentation

## 2014-01-30 DIAGNOSIS — M545 Low back pain, unspecified: Secondary | ICD-10-CM | POA: Diagnosis not present

## 2014-01-30 DIAGNOSIS — R293 Abnormal posture: Secondary | ICD-10-CM | POA: Diagnosis not present

## 2014-02-03 ENCOUNTER — Telehealth: Payer: Self-pay | Admitting: Internal Medicine

## 2014-02-03 NOTE — Telephone Encounter (Signed)
PT CALLED TO R/S APPT....DONE....PT AWARE OF NEW D.T °

## 2014-02-04 ENCOUNTER — Other Ambulatory Visit: Payer: MEDICARE

## 2014-02-14 ENCOUNTER — Ambulatory Visit: Payer: MEDICARE

## 2014-02-14 ENCOUNTER — Telehealth: Payer: Self-pay | Admitting: Internal Medicine

## 2014-02-14 ENCOUNTER — Other Ambulatory Visit (HOSPITAL_BASED_OUTPATIENT_CLINIC_OR_DEPARTMENT_OTHER): Payer: MEDICARE

## 2014-02-14 DIAGNOSIS — D45 Polycythemia vera: Secondary | ICD-10-CM

## 2014-02-14 DIAGNOSIS — D751 Secondary polycythemia: Secondary | ICD-10-CM

## 2014-02-14 LAB — CBC WITH DIFFERENTIAL/PLATELET
BASO%: 0.6 % (ref 0.0–2.0)
Basophils Absolute: 0 10*3/uL (ref 0.0–0.1)
EOS%: 1.9 % (ref 0.0–7.0)
Eosinophils Absolute: 0.1 10*3/uL (ref 0.0–0.5)
HCT: 44.6 % (ref 38.4–49.9)
HGB: 14.5 g/dL (ref 13.0–17.1)
LYMPH%: 22.1 % (ref 14.0–49.0)
MCH: 30.5 pg (ref 27.2–33.4)
MCHC: 32.5 g/dL (ref 32.0–36.0)
MCV: 93.7 fL (ref 79.3–98.0)
MONO#: 0.4 10*3/uL (ref 0.1–0.9)
MONO%: 8.3 % (ref 0.0–14.0)
NEUT#: 3.3 10*3/uL (ref 1.5–6.5)
NEUT%: 67.1 % (ref 39.0–75.0)
Platelets: 124 10*3/uL — ABNORMAL LOW (ref 140–400)
RBC: 4.76 10*6/uL (ref 4.20–5.82)
RDW: 15.4 % — AB (ref 11.0–14.6)
WBC: 4.9 10*3/uL (ref 4.0–10.3)
lymph#: 1.1 10*3/uL (ref 0.9–3.3)

## 2014-02-14 NOTE — Telephone Encounter (Signed)
pt needed to r/s appt to later time....done...pt refused print out and will ck my chart.

## 2014-02-14 NOTE — Progress Notes (Signed)
Hct 44.6. Per office note phlebotomy to maintain HCT below 45. Will hold phlebotomy today. Gave patient calendar with future appointments on it. Patient verbalized understanding.

## 2014-02-17 ENCOUNTER — Other Ambulatory Visit: Payer: Self-pay

## 2014-03-07 ENCOUNTER — Telehealth: Payer: Self-pay | Admitting: Internal Medicine

## 2014-03-07 ENCOUNTER — Telehealth: Payer: Self-pay | Admitting: *Deleted

## 2014-03-07 ENCOUNTER — Ambulatory Visit: Payer: MEDICARE

## 2014-03-07 ENCOUNTER — Other Ambulatory Visit: Payer: MEDICARE

## 2014-03-07 ENCOUNTER — Other Ambulatory Visit (HOSPITAL_BASED_OUTPATIENT_CLINIC_OR_DEPARTMENT_OTHER): Payer: MEDICARE

## 2014-03-07 DIAGNOSIS — D696 Thrombocytopenia, unspecified: Secondary | ICD-10-CM

## 2014-03-07 DIAGNOSIS — D751 Secondary polycythemia: Secondary | ICD-10-CM

## 2014-03-07 LAB — CBC WITH DIFFERENTIAL/PLATELET
BASO%: 0.6 % (ref 0.0–2.0)
BASOS ABS: 0 10*3/uL (ref 0.0–0.1)
EOS%: 3.2 % (ref 0.0–7.0)
Eosinophils Absolute: 0.2 10*3/uL (ref 0.0–0.5)
HCT: 44.5 % (ref 38.4–49.9)
HEMOGLOBIN: 14.9 g/dL (ref 13.0–17.1)
LYMPH#: 1.3 10*3/uL (ref 0.9–3.3)
LYMPH%: 25.9 % (ref 14.0–49.0)
MCH: 31.3 pg (ref 27.2–33.4)
MCHC: 33.5 g/dL (ref 32.0–36.0)
MCV: 93.4 fL (ref 79.3–98.0)
MONO#: 0.5 10*3/uL (ref 0.1–0.9)
MONO%: 10.3 % (ref 0.0–14.0)
NEUT%: 60 % (ref 39.0–75.0)
NEUTROS ABS: 2.9 10*3/uL (ref 1.5–6.5)
Platelets: 111 10*3/uL — ABNORMAL LOW (ref 140–400)
RBC: 4.77 10*6/uL (ref 4.20–5.82)
RDW: 15.2 % — AB (ref 11.0–14.6)
WBC: 4.9 10*3/uL (ref 4.0–10.3)

## 2014-03-07 LAB — COMPREHENSIVE METABOLIC PANEL (CC13)
ALBUMIN: 3.8 g/dL (ref 3.5–5.0)
ALT: 28 U/L (ref 0–55)
AST: 40 U/L — AB (ref 5–34)
Alkaline Phosphatase: 77 U/L (ref 40–150)
Anion Gap: 9 mEq/L (ref 3–11)
BUN: 19.4 mg/dL (ref 7.0–26.0)
CALCIUM: 9.5 mg/dL (ref 8.4–10.4)
CHLORIDE: 110 meq/L — AB (ref 98–109)
CO2: 20 mEq/L — ABNORMAL LOW (ref 22–29)
Creatinine: 1.7 mg/dL — ABNORMAL HIGH (ref 0.7–1.3)
Glucose: 95 mg/dl (ref 70–140)
POTASSIUM: 4.3 meq/L (ref 3.5–5.1)
Sodium: 139 mEq/L (ref 136–145)
TOTAL PROTEIN: 7.3 g/dL (ref 6.4–8.3)
Total Bilirubin: 0.39 mg/dL (ref 0.20–1.20)

## 2014-03-07 NOTE — Telephone Encounter (Signed)
lvm for pt regarding to todays adn May appt.Marland KitchenMarland Kitchenpt will get new sched at todays visit

## 2014-03-07 NOTE — Telephone Encounter (Signed)
Left VM message for patient to keep 3:15 lab appt. For today.  We will r/s his appt. With Dr. Juliann Mule.  We will try and get him back early for his phlebotomy so that he does not have to wait so long. POF done to r/s Dr. Boyce Medici appt.

## 2014-03-07 NOTE — Progress Notes (Signed)
Spoke to patient in lobby. His HCT is 44.5 today. Per Dr. Boyce Medici office note he does not need phlebotomy today and he is happy with this. Calendar given to patient with return date 04/11/14 for labs/MD and possible phlebotomy. Patient verbalizes understanding.

## 2014-03-07 NOTE — Telephone Encounter (Signed)
pt called to r/s nxt visit to 5.29 due to going out of town...Marland KitchenMarland KitchenMarland Kitchenpt ok will appts

## 2014-03-13 ENCOUNTER — Encounter: Payer: BC Managed Care – PPO | Admitting: *Deleted

## 2014-03-14 ENCOUNTER — Encounter (HOSPITAL_COMMUNITY): Payer: Self-pay | Admitting: Emergency Medicine

## 2014-03-14 ENCOUNTER — Emergency Department (HOSPITAL_COMMUNITY)
Admission: EM | Admit: 2014-03-14 | Discharge: 2014-03-14 | Disposition: A | Discharge status: Home / Self Care | Payer: MEDICARE | Attending: Emergency Medicine | Admitting: Emergency Medicine

## 2014-03-14 DIAGNOSIS — R011 Cardiac murmur, unspecified: Secondary | ICD-10-CM | POA: Insufficient documentation

## 2014-03-14 DIAGNOSIS — S46819A Strain of other muscles, fascia and tendons at shoulder and upper arm level, unspecified arm, initial encounter: Secondary | ICD-10-CM

## 2014-03-14 DIAGNOSIS — I2510 Atherosclerotic heart disease of native coronary artery without angina pectoris: Secondary | ICD-10-CM | POA: Insufficient documentation

## 2014-03-14 DIAGNOSIS — Z86718 Personal history of other venous thrombosis and embolism: Secondary | ICD-10-CM | POA: Insufficient documentation

## 2014-03-14 DIAGNOSIS — M7918 Myalgia, other site: Secondary | ICD-10-CM

## 2014-03-14 DIAGNOSIS — E781 Pure hyperglyceridemia: Secondary | ICD-10-CM | POA: Insufficient documentation

## 2014-03-14 DIAGNOSIS — I472 Ventricular tachycardia, unspecified: Secondary | ICD-10-CM | POA: Insufficient documentation

## 2014-03-14 DIAGNOSIS — K219 Gastro-esophageal reflux disease without esophagitis: Secondary | ICD-10-CM | POA: Insufficient documentation

## 2014-03-14 DIAGNOSIS — S43499A Other sprain of unspecified shoulder joint, initial encounter: Secondary | ICD-10-CM | POA: Insufficient documentation

## 2014-03-14 DIAGNOSIS — Z888 Allergy status to other drugs, medicaments and biological substances status: Secondary | ICD-10-CM | POA: Insufficient documentation

## 2014-03-14 DIAGNOSIS — I5030 Unspecified diastolic (congestive) heart failure: Secondary | ICD-10-CM | POA: Insufficient documentation

## 2014-03-14 DIAGNOSIS — R51 Headache: Secondary | ICD-10-CM | POA: Insufficient documentation

## 2014-03-14 DIAGNOSIS — Z95810 Presence of automatic (implantable) cardiac defibrillator: Secondary | ICD-10-CM | POA: Insufficient documentation

## 2014-03-14 DIAGNOSIS — N183 Chronic kidney disease, stage 3 unspecified: Secondary | ICD-10-CM | POA: Insufficient documentation

## 2014-03-14 DIAGNOSIS — T07XXXA Unspecified multiple injuries, initial encounter: Secondary | ICD-10-CM | POA: Insufficient documentation

## 2014-03-14 DIAGNOSIS — X58XXXA Exposure to other specified factors, initial encounter: Secondary | ICD-10-CM

## 2014-03-14 DIAGNOSIS — Z79899 Other long term (current) drug therapy: Secondary | ICD-10-CM | POA: Insufficient documentation

## 2014-03-14 DIAGNOSIS — A6920 Lyme disease, unspecified: Secondary | ICD-10-CM | POA: Insufficient documentation

## 2014-03-14 DIAGNOSIS — I252 Old myocardial infarction: Secondary | ICD-10-CM | POA: Insufficient documentation

## 2014-03-14 DIAGNOSIS — I4901 Ventricular fibrillation: Secondary | ICD-10-CM | POA: Insufficient documentation

## 2014-03-14 DIAGNOSIS — D45 Polycythemia vera: Secondary | ICD-10-CM | POA: Insufficient documentation

## 2014-03-14 DIAGNOSIS — Z8701 Personal history of pneumonia (recurrent): Secondary | ICD-10-CM | POA: Insufficient documentation

## 2014-03-14 DIAGNOSIS — Z880 Allergy status to penicillin: Secondary | ICD-10-CM | POA: Insufficient documentation

## 2014-03-14 DIAGNOSIS — R079 Chest pain, unspecified: Secondary | ICD-10-CM | POA: Insufficient documentation

## 2014-03-14 DIAGNOSIS — Y9389 Activity, other specified: Secondary | ICD-10-CM | POA: Insufficient documentation

## 2014-03-14 DIAGNOSIS — G43909 Migraine, unspecified, not intractable, without status migrainosus: Secondary | ICD-10-CM | POA: Insufficient documentation

## 2014-03-14 DIAGNOSIS — I4729 Other ventricular tachycardia: Secondary | ICD-10-CM | POA: Insufficient documentation

## 2014-03-14 DIAGNOSIS — I422 Other hypertrophic cardiomyopathy: Secondary | ICD-10-CM | POA: Insufficient documentation

## 2014-03-14 DIAGNOSIS — X503XXA Overexertion from repetitive movements, initial encounter: Secondary | ICD-10-CM | POA: Insufficient documentation

## 2014-03-14 DIAGNOSIS — J9819 Other pulmonary collapse: Secondary | ICD-10-CM | POA: Insufficient documentation

## 2014-03-14 DIAGNOSIS — Z91041 Radiographic dye allergy status: Secondary | ICD-10-CM | POA: Insufficient documentation

## 2014-03-14 DIAGNOSIS — Y929 Unspecified place or not applicable: Secondary | ICD-10-CM | POA: Insufficient documentation

## 2014-03-14 DIAGNOSIS — M109 Gout, unspecified: Secondary | ICD-10-CM | POA: Insufficient documentation

## 2014-03-14 DIAGNOSIS — R4020 Unspecified coma: Secondary | ICD-10-CM | POA: Insufficient documentation

## 2014-03-14 DIAGNOSIS — G4730 Sleep apnea, unspecified: Secondary | ICD-10-CM | POA: Insufficient documentation

## 2014-03-14 LAB — BASIC METABOLIC PANEL
BUN: 20 mg/dL (ref 6–23)
CALCIUM: 9.2 mg/dL (ref 8.4–10.5)
CO2: 19 mEq/L (ref 19–32)
CREATININE: 1.61 mg/dL — AB (ref 0.50–1.35)
Chloride: 103 mEq/L (ref 96–112)
GFR calc Af Amer: 56 mL/min — ABNORMAL LOW (ref >90)
GFR, EST NON AFRICAN AMERICAN: 48 mL/min — AB (ref >90)
Glucose, Bld: 88 mg/dL (ref 70–99)
Potassium: 4.3 mEq/L (ref 3.7–5.3)
SODIUM: 140 meq/L (ref 137–147)

## 2014-03-14 LAB — CBC WITH DIFFERENTIAL/PLATELET
Basophils Absolute: 0 10*3/uL (ref 0.0–0.1)
Basophils Relative: 0 % (ref 0–1)
EOS ABS: 0.2 10*3/uL (ref 0.0–0.7)
EOS PCT: 3 % (ref 0–5)
HCT: 42.9 % (ref 39.0–52.0)
Hemoglobin: 15.2 g/dL (ref 13.0–17.0)
Lymphocytes Relative: 22 % (ref 12–46)
Lymphs Abs: 1.2 10*3/uL (ref 0.7–4.0)
MCH: 32.6 pg (ref 26.0–34.0)
MCHC: 35.4 g/dL (ref 30.0–36.0)
MCV: 92.1 fL (ref 78.0–100.0)
Monocytes Absolute: 0.4 10*3/uL (ref 0.1–1.0)
Monocytes Relative: 8 % (ref 3–12)
Neutro Abs: 3.7 10*3/uL (ref 1.7–7.7)
Neutrophils Relative %: 67 % (ref 43–77)
PLATELETS: 126 10*3/uL — AB (ref 150–400)
RBC: 4.66 MIL/uL (ref 4.22–5.81)
RDW: 14 % (ref 11.5–15.5)
WBC: 5.5 10*3/uL (ref 4.0–10.5)

## 2014-03-14 LAB — TROPONIN I: Troponin I: <0.3 ng/mL (ref <0.30)

## 2014-03-14 MED ORDER — METHOCARBAMOL 500 MG PO TABS: 500.0000 mg | ORAL_TABLET | Freq: Two times a day (BID) | ORAL | Status: DC | PRN

## 2014-03-14 MED ORDER — HYDROCODONE-ACETAMINOPHEN 5-325 MG PO TABS: ORAL_TABLET | ORAL | Status: DC

## 2014-03-14 MED ORDER — OXYCODONE-ACETAMINOPHEN 5-325 MG PO TABS
2.0000 | ORAL_TABLET | Freq: Once | ORAL | Status: AC
  Administered 2014-03-14: 2 via ORAL
  Filled 2014-03-14: qty 2

## 2014-03-14 NOTE — ED Notes (Signed)
Pt c/o R lower back pain and L shoulder pain since helping someone move last Tuesday.  Pain increases with movement.
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
## 2014-03-14 NOTE — Discharge Instructions (Signed)
Only use the arm sling for up to 2 days. Take the arm out and rotate the shoulder every 4 hours.   For pain control please take Ibuprofen (also known as Motrin or Advil) 400mg  (this is normally 2 over the counter pills) every 6 hours. Take with food to minimize stomach irritation.  Take vicodin for breakthrough pain, do not drink alcohol, drive, care for children or do other critical tasks while taking vicodin.  Please follow with your primary care doctor in the next 2 days for a check-up. They must obtain records for further management.   Do not hesitate to return to the Emergency Department for any new, worsening or concerning symptoms.

## 2014-03-22 NOTE — ED Provider Notes (Signed)
Medical screening examination/treatment/procedure(s) were performed by non-physician practitioner and as supervising physician I was immediately available for consultation/collaboration.   EKG Interpretation   Date/Time:  Friday Mar 14 2014 18:38:48 EDT Ventricular Rate:  75 PR Interval:  166 QRS Duration: 172 QT Interval:  460 QTC Calculation: 513 R Axis:   -140 Text Interpretation:  Normal sinus rhythm Right atrial enlargement Right  superior axis deviation Left bundle branch block Abnormal ECG No  significant change since 12/11/13 Confirmed by Beau Fanny  MD, Averly Ericson ([id number])  on 03/14/2014 6:46:18 PM       Veryl Speak, MD 03/22/14 1929

## 2014-03-26 ENCOUNTER — Encounter: Payer: Self-pay | Admitting: *Deleted

## 2014-04-04 ENCOUNTER — Ambulatory Visit: Payer: MEDICARE

## 2014-04-04 ENCOUNTER — Other Ambulatory Visit: Payer: MEDICARE

## 2014-04-11 ENCOUNTER — Ambulatory Visit (HOSPITAL_BASED_OUTPATIENT_CLINIC_OR_DEPARTMENT_OTHER): Payer: MEDICARE | Admitting: Internal Medicine

## 2014-04-11 ENCOUNTER — Other Ambulatory Visit (HOSPITAL_BASED_OUTPATIENT_CLINIC_OR_DEPARTMENT_OTHER): Payer: MEDICARE

## 2014-04-11 ENCOUNTER — Telehealth: Payer: Self-pay | Admitting: Internal Medicine

## 2014-04-11 ENCOUNTER — Ambulatory Visit (HOSPITAL_BASED_OUTPATIENT_CLINIC_OR_DEPARTMENT_OTHER): Payer: MEDICARE

## 2014-04-11 VITALS — BP 126/82 | HR 65 | Temp 97.3°F | Resp 18

## 2014-04-11 VITALS — BP 143/88 | HR 66 | Temp 97.4°F | Resp 18 | Ht 66.0 in | Wt 186.2 lb

## 2014-04-11 DIAGNOSIS — D45 Polycythemia vera: Secondary | ICD-10-CM

## 2014-04-11 DIAGNOSIS — M549 Dorsalgia, unspecified: Secondary | ICD-10-CM

## 2014-04-11 DIAGNOSIS — D696 Thrombocytopenia, unspecified: Secondary | ICD-10-CM

## 2014-04-11 DIAGNOSIS — D751 Secondary polycythemia: Secondary | ICD-10-CM

## 2014-04-11 DIAGNOSIS — G8929 Other chronic pain: Secondary | ICD-10-CM

## 2014-04-11 LAB — CBC WITH DIFFERENTIAL/PLATELET
BASO%: 1 % (ref 0.0–2.0)
Basophils Absolute: 0.1 10*3/uL (ref 0.0–0.1)
EOS%: 3.7 % (ref 0.0–7.0)
Eosinophils Absolute: 0.2 10*3/uL (ref 0.0–0.5)
HCT: 46.5 % (ref 38.4–49.9)
HGB: 15.7 g/dL (ref 13.0–17.1)
LYMPH%: 21.8 % (ref 14.0–49.0)
MCH: 31.5 pg (ref 27.2–33.4)
MCHC: 33.6 g/dL (ref 32.0–36.0)
MCV: 93.7 fL (ref 79.3–98.0)
MONO#: 0.5 10*3/uL (ref 0.1–0.9)
MONO%: 8 % (ref 0.0–14.0)
NEUT%: 65.5 % (ref 39.0–75.0)
NEUTROS ABS: 3.7 10*3/uL (ref 1.5–6.5)
Platelets: 128 10*3/uL — ABNORMAL LOW (ref 140–400)
RBC: 4.97 10*6/uL (ref 4.20–5.82)
RDW: 14 % (ref 11.0–14.6)
WBC: 5.7 10*3/uL (ref 4.0–10.3)
lymph#: 1.2 10*3/uL (ref 0.9–3.3)

## 2014-04-11 NOTE — Patient Instructions (Signed)
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
## 2014-04-11 NOTE — Telephone Encounter (Signed)
PT GIVEN SCHEDULE FOR JUNE THRU AUG. THEN POSS MTHLY PHLEB APPTS WERE ADDED TO LABS PER DESK NURSE. PT GIVEN NEW APPT SCHEDULE FOR JUNE THRU AUG WHILE IN INF.

## 2014-04-11 NOTE — Progress Notes (Signed)
Phlebotomy completed per MD order. 1 unit of blood removed from patient's left forearm with 18 gauge catheter. Patient observed 30 minutes after phlebotomy completed and given drink and sandwich. VS taken and patient discharged home. Cindi Carbon, RN

## 2014-05-09 ENCOUNTER — Other Ambulatory Visit (HOSPITAL_BASED_OUTPATIENT_CLINIC_OR_DEPARTMENT_OTHER): Payer: MEDICARE

## 2014-05-09 ENCOUNTER — Ambulatory Visit: Payer: MEDICARE

## 2014-05-09 DIAGNOSIS — D751 Secondary polycythemia: Secondary | ICD-10-CM

## 2014-05-09 DIAGNOSIS — D45 Polycythemia vera: Secondary | ICD-10-CM

## 2014-05-09 LAB — CBC WITH DIFFERENTIAL/PLATELET
BASO%: 0.2 % (ref 0.0–2.0)
Basophils Absolute: 0 10*3/uL (ref 0.0–0.1)
EOS%: 2.4 % (ref 0.0–7.0)
Eosinophils Absolute: 0.1 10*3/uL (ref 0.0–0.5)
HCT: 41 % (ref 38.4–49.9)
HGB: 13.7 g/dL (ref 13.0–17.1)
LYMPH#: 1.1 10*3/uL (ref 0.9–3.3)
LYMPH%: 24.2 % (ref 14.0–49.0)
MCH: 29.8 pg (ref 27.2–33.4)
MCHC: 33.4 g/dL (ref 32.0–36.0)
MCV: 89.3 fL (ref 79.3–98.0)
MONO#: 0.4 10*3/uL (ref 0.1–0.9)
MONO%: 9.8 % (ref 0.0–14.0)
NEUT#: 2.9 10*3/uL (ref 1.5–6.5)
NEUT%: 63.4 % (ref 39.0–75.0)
Platelets: 113 10*3/uL — ABNORMAL LOW (ref 140–400)
RBC: 4.59 10*6/uL (ref 4.20–5.82)
RDW: 13.2 % (ref 11.0–14.6)
WBC: 4.5 10*3/uL (ref 4.0–10.3)

## 2014-05-09 NOTE — Progress Notes (Signed)
Spoke with pt in lobby, informed him of HCT 41. Phlebotomy not needed. Follow up as scheduled 7/24. Pt voiced understanding.

## 2014-05-21 ENCOUNTER — Telehealth: Payer: Self-pay | Admitting: Cardiology

## 2014-05-21 NOTE — Telephone Encounter (Signed)
Informed pt that due to updates he needs to send manual transmission. Pt verbalized understanding and said it would be one day this week.

## 2014-05-26 ENCOUNTER — Encounter (HOSPITAL_COMMUNITY): Payer: Self-pay | Admitting: Emergency Medicine

## 2014-05-26 DIAGNOSIS — R011 Cardiac murmur, unspecified: Secondary | ICD-10-CM | POA: Insufficient documentation

## 2014-05-26 DIAGNOSIS — I422 Other hypertrophic cardiomyopathy: Secondary | ICD-10-CM | POA: Insufficient documentation

## 2014-05-26 DIAGNOSIS — Z888 Allergy status to other drugs, medicaments and biological substances status: Secondary | ICD-10-CM | POA: Insufficient documentation

## 2014-05-26 DIAGNOSIS — I4901 Ventricular fibrillation: Secondary | ICD-10-CM | POA: Insufficient documentation

## 2014-05-26 DIAGNOSIS — Z86718 Personal history of other venous thrombosis and embolism: Secondary | ICD-10-CM | POA: Insufficient documentation

## 2014-05-26 DIAGNOSIS — Z79899 Other long term (current) drug therapy: Secondary | ICD-10-CM | POA: Insufficient documentation

## 2014-05-26 DIAGNOSIS — R51 Headache: Secondary | ICD-10-CM | POA: Insufficient documentation

## 2014-05-26 DIAGNOSIS — K5900 Constipation, unspecified: Secondary | ICD-10-CM | POA: Insufficient documentation

## 2014-05-26 DIAGNOSIS — G4730 Sleep apnea, unspecified: Secondary | ICD-10-CM | POA: Insufficient documentation

## 2014-05-26 DIAGNOSIS — G43909 Migraine, unspecified, not intractable, without status migrainosus: Secondary | ICD-10-CM | POA: Insufficient documentation

## 2014-05-26 DIAGNOSIS — Z95810 Presence of automatic (implantable) cardiac defibrillator: Secondary | ICD-10-CM | POA: Insufficient documentation

## 2014-05-26 DIAGNOSIS — I252 Old myocardial infarction: Secondary | ICD-10-CM | POA: Insufficient documentation

## 2014-05-26 DIAGNOSIS — I472 Ventricular tachycardia, unspecified: Secondary | ICD-10-CM | POA: Insufficient documentation

## 2014-05-26 DIAGNOSIS — E781 Pure hyperglyceridemia: Secondary | ICD-10-CM | POA: Insufficient documentation

## 2014-05-26 DIAGNOSIS — I4729 Other ventricular tachycardia: Secondary | ICD-10-CM | POA: Insufficient documentation

## 2014-05-26 DIAGNOSIS — R079 Chest pain, unspecified: Secondary | ICD-10-CM | POA: Insufficient documentation

## 2014-05-26 DIAGNOSIS — I5030 Unspecified diastolic (congestive) heart failure: Secondary | ICD-10-CM | POA: Insufficient documentation

## 2014-05-26 DIAGNOSIS — A6920 Lyme disease, unspecified: Secondary | ICD-10-CM | POA: Insufficient documentation

## 2014-05-26 DIAGNOSIS — K219 Gastro-esophageal reflux disease without esophagitis: Secondary | ICD-10-CM | POA: Insufficient documentation

## 2014-05-26 DIAGNOSIS — N183 Chronic kidney disease, stage 3 unspecified: Secondary | ICD-10-CM | POA: Insufficient documentation

## 2014-05-26 DIAGNOSIS — M109 Gout, unspecified: Secondary | ICD-10-CM | POA: Insufficient documentation

## 2014-05-26 DIAGNOSIS — I2510 Atherosclerotic heart disease of native coronary artery without angina pectoris: Secondary | ICD-10-CM | POA: Insufficient documentation

## 2014-05-26 DIAGNOSIS — D45 Polycythemia vera: Secondary | ICD-10-CM | POA: Insufficient documentation

## 2014-05-26 DIAGNOSIS — Z880 Allergy status to penicillin: Secondary | ICD-10-CM | POA: Insufficient documentation

## 2014-05-26 DIAGNOSIS — Z8701 Personal history of pneumonia (recurrent): Secondary | ICD-10-CM | POA: Insufficient documentation

## 2014-05-26 LAB — CBC WITH DIFFERENTIAL/PLATELET
BASOS ABS: 0 10*3/uL (ref 0.0–0.1)
Basophils Relative: 0 % (ref 0–1)
Eosinophils Absolute: 0.1 10*3/uL (ref 0.0–0.7)
Eosinophils Relative: 2 % (ref 0–5)
HCT: 40 % (ref 39.0–52.0)
HEMOGLOBIN: 13.7 g/dL (ref 13.0–17.0)
Lymphocytes Relative: 20 % (ref 12–46)
Lymphs Abs: 1.2 10*3/uL (ref 0.7–4.0)
MCH: 30.6 pg (ref 26.0–34.0)
MCHC: 34.3 g/dL (ref 30.0–36.0)
MCV: 89.5 fL (ref 78.0–100.0)
MONOS PCT: 7 % (ref 3–12)
Monocytes Absolute: 0.4 10*3/uL (ref 0.1–1.0)
NEUTROS ABS: 4.3 10*3/uL (ref 1.7–7.7)
NEUTROS PCT: 71 % (ref 43–77)
PLATELETS: 122 10*3/uL — AB (ref 150–400)
RBC: 4.47 MIL/uL (ref 4.22–5.81)
RDW: 13.5 % (ref 11.5–15.5)
WBC: 6 10*3/uL (ref 4.0–10.5)

## 2014-05-26 LAB — COMPREHENSIVE METABOLIC PANEL
ALBUMIN: 3.6 g/dL (ref 3.5–5.2)
ALK PHOS: 77 U/L (ref 39–117)
ALT: 31 U/L (ref 0–53)
AST: 40 U/L — AB (ref 0–37)
Anion gap: 16 — ABNORMAL HIGH (ref 5–15)
BUN: 18 mg/dL (ref 6–23)
CHLORIDE: 100 meq/L (ref 96–112)
CO2: 23 mEq/L (ref 19–32)
Calcium: 9.1 mg/dL (ref 8.4–10.5)
Creatinine, Ser: 1.66 mg/dL — ABNORMAL HIGH (ref 0.50–1.35)
GFR calc Af Amer: 53 mL/min — ABNORMAL LOW (ref >90)
GFR calc non Af Amer: 46 mL/min — ABNORMAL LOW (ref >90)
Glucose, Bld: 118 mg/dL — ABNORMAL HIGH (ref 70–99)
POTASSIUM: 3.9 meq/L (ref 3.7–5.3)
Sodium: 139 mEq/L (ref 137–147)
TOTAL PROTEIN: 7.1 g/dL (ref 6.0–8.3)

## 2014-05-26 LAB — URINALYSIS, ROUTINE W REFLEX MICROSCOPIC
BILIRUBIN URINE: NEGATIVE
GLUCOSE, UA: NEGATIVE mg/dL
Ketones, ur: NEGATIVE mg/dL
LEUKOCYTES UA: NEGATIVE
NITRITE: NEGATIVE
PH: 6 (ref 5.0–8.0)
Protein, ur: 100 mg/dL — AB
Specific Gravity, Urine: 1.02 (ref 1.005–1.030)
Urobilinogen, UA: 1 mg/dL (ref 0.0–1.0)

## 2014-05-26 LAB — URINE MICROSCOPIC-ADD ON

## 2014-05-26 NOTE — ED Notes (Signed)
Pt has had recent issues with lower back pain that was helped with stretching.  Pt states the same pain seems to have moved into his pelvis

## 2014-05-27 ENCOUNTER — Emergency Department (HOSPITAL_COMMUNITY)
Admission: EM | Admit: 2014-05-27 | Discharge: 2014-05-27 | Disposition: A | Discharge status: Home / Self Care | Payer: MEDICARE | Attending: Emergency Medicine | Admitting: Emergency Medicine

## 2014-05-27 ENCOUNTER — Emergency Department (HOSPITAL_COMMUNITY): Payer: MEDICARE

## 2014-05-27 DIAGNOSIS — K5900 Constipation, unspecified: Secondary | ICD-10-CM

## 2014-05-27 DIAGNOSIS — R102 Pelvic and perineal pain: Secondary | ICD-10-CM

## 2014-05-27 MED ORDER — POLYETHYLENE GLYCOL 3350 17 GM/SCOOP PO POWD: 17.0000 g | Freq: Two times a day (BID) | ORAL | Status: DC

## 2014-05-27 MED ORDER — DOCUSATE SODIUM 100 MG PO CAPS: 100.0000 mg | ORAL_CAPSULE | Freq: Two times a day (BID) | ORAL | Status: DC
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
## 2014-05-27 NOTE — Discharge Instructions (Signed)
Your xray shows significant constipation - use the medicines as directed.  There is some protein in your urine, this needs to be rechecked in the next 2 weeks.

## 2014-06-05 ENCOUNTER — Telehealth: Payer: Self-pay | Admitting: Internal Medicine

## 2014-06-05 NOTE — Telephone Encounter (Signed)
Pt called to cancel apt for 07/24 due to being in the hospital, forward msg to [facility] .[name]..KJ

## 2014-06-06 ENCOUNTER — Other Ambulatory Visit: Payer: MEDICARE

## 2014-07-04 ENCOUNTER — Telehealth: Payer: Self-pay | Admitting: Internal Medicine

## 2014-07-04 ENCOUNTER — Other Ambulatory Visit (HOSPITAL_BASED_OUTPATIENT_CLINIC_OR_DEPARTMENT_OTHER): Payer: MEDICARE

## 2014-07-04 ENCOUNTER — Ambulatory Visit (HOSPITAL_BASED_OUTPATIENT_CLINIC_OR_DEPARTMENT_OTHER): Payer: MEDICARE

## 2014-07-04 ENCOUNTER — Ambulatory Visit (HOSPITAL_BASED_OUTPATIENT_CLINIC_OR_DEPARTMENT_OTHER): Payer: MEDICARE | Admitting: Internal Medicine

## 2014-07-04 VITALS — BP 118/66 | HR 64 | Temp 97.6°F | Resp 19 | Ht 66.0 in | Wt 183.2 lb

## 2014-07-04 DIAGNOSIS — D751 Secondary polycythemia: Secondary | ICD-10-CM

## 2014-07-04 DIAGNOSIS — D696 Thrombocytopenia, unspecified: Secondary | ICD-10-CM

## 2014-07-04 LAB — CBC WITH DIFFERENTIAL/PLATELET
BASO%: 0.7 % (ref 0.0–2.0)
BASOS ABS: 0 10*3/uL (ref 0.0–0.1)
EOS ABS: 0.1 10*3/uL (ref 0.0–0.5)
EOS%: 1.7 % (ref 0.0–7.0)
HCT: 48.5 % (ref 38.4–49.9)
HGB: 16 g/dL (ref 13.0–17.1)
LYMPH%: 20.6 % (ref 14.0–49.0)
MCH: 30.5 pg (ref 27.2–33.4)
MCHC: 32.9 g/dL (ref 32.0–36.0)
MCV: 92.7 fL (ref 79.3–98.0)
MONO#: 0.3 10*3/uL (ref 0.1–0.9)
MONO%: 6.5 % (ref 0.0–14.0)
NEUT#: 3.3 10*3/uL (ref 1.5–6.5)
NEUT%: 70.5 % (ref 39.0–75.0)
Platelets: 127 10*3/uL — ABNORMAL LOW (ref 140–400)
RBC: 5.23 10*6/uL (ref 4.20–5.82)
RDW: 15.1 % — ABNORMAL HIGH (ref 11.0–14.6)
WBC: 4.7 10*3/uL (ref 4.0–10.3)
lymph#: 1 10*3/uL (ref 0.9–3.3)

## 2014-07-04 LAB — COMPREHENSIVE METABOLIC PANEL (CC13)
ALT: 29 U/L (ref 0–55)
ANION GAP: 8 meq/L (ref 3–11)
AST: 35 U/L — ABNORMAL HIGH (ref 5–34)
Albumin: 3.8 g/dL (ref 3.5–5.0)
Alkaline Phosphatase: 82 U/L (ref 40–150)
BUN: 17.7 mg/dL (ref 7.0–26.0)
CALCIUM: 9.5 mg/dL (ref 8.4–10.4)
CO2: 22 meq/L (ref 22–29)
CREATININE: 1.8 mg/dL — AB (ref 0.7–1.3)
Chloride: 107 mEq/L (ref 98–109)
GLUCOSE: 126 mg/dL (ref 70–140)
Potassium: 4.2 mEq/L (ref 3.5–5.1)
Sodium: 137 mEq/L (ref 136–145)
Total Bilirubin: 0.32 mg/dL (ref 0.20–1.20)
Total Protein: 7.5 g/dL (ref 6.4–8.3)

## 2014-07-04 NOTE — Patient Instructions (Signed)

## 2014-07-04 NOTE — Telephone Encounter (Signed)
gv and printed appt sched and avs for pt for Sept thru [facility]

## 2014-07-04 NOTE — Progress Notes (Signed)
Phlebotomy performed via right AC with no problems. Vitals WNL.  Pt given food and drink.
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
## 2014-07-04 NOTE — Telephone Encounter (Signed)
gv adn printed appt sched and avs for pt for Sept thru [facility]

## 2014-07-31 ENCOUNTER — Encounter: Payer: Self-pay | Admitting: Family Medicine

## 2014-08-01 ENCOUNTER — Other Ambulatory Visit (HOSPITAL_BASED_OUTPATIENT_CLINIC_OR_DEPARTMENT_OTHER): Payer: MEDICARE

## 2014-08-01 ENCOUNTER — Ambulatory Visit: Payer: MEDICARE

## 2014-08-01 DIAGNOSIS — D45 Polycythemia vera: Secondary | ICD-10-CM

## 2014-08-01 DIAGNOSIS — D751 Secondary polycythemia: Secondary | ICD-10-CM

## 2014-08-01 LAB — CBC WITH DIFFERENTIAL/PLATELET
BASO%: 0.4 % (ref 0.0–2.0)
Basophils Absolute: 0 10*3/uL (ref 0.0–0.1)
EOS%: 2.2 % (ref 0.0–7.0)
Eosinophils Absolute: 0.1 10*3/uL (ref 0.0–0.5)
HCT: 42.3 % (ref 38.4–49.9)
HEMOGLOBIN: 14.8 g/dL (ref 13.0–17.1)
LYMPH#: 1.1 10*3/uL (ref 0.9–3.3)
LYMPH%: 23.7 % (ref 14.0–49.0)
MCH: 31.5 pg (ref 27.2–33.4)
MCHC: 35 g/dL (ref 32.0–36.0)
MCV: 90 fL (ref 79.3–98.0)
MONO#: 0.3 10*3/uL (ref 0.1–0.9)
MONO%: 7.3 % (ref 0.0–14.0)
NEUT#: 3 10*3/uL (ref 1.5–6.5)
NEUT%: 66.4 % (ref 39.0–75.0)
Platelets: 91 10*3/uL — ABNORMAL LOW (ref 140–400)
RBC: 4.7 10*6/uL (ref 4.20–5.82)
RDW: 14 % (ref 11.0–14.6)
WBC: 4.6 10*3/uL (ref 4.0–10.3)

## 2014-08-01 NOTE — Progress Notes (Signed)
Phlebotomy not needed today due to HCT being less than 45 %. Labs given to patient. Patient verbalized understanding.

## 2014-08-09 ENCOUNTER — Emergency Department (HOSPITAL_COMMUNITY)
Admission: EM | Admit: 2014-08-09 | Discharge: 2014-08-10 | Disposition: A | Discharge status: Home / Self Care | Payer: MEDICARE | Attending: Emergency Medicine | Admitting: Emergency Medicine

## 2014-08-09 ENCOUNTER — Encounter (HOSPITAL_COMMUNITY): Payer: Self-pay | Admitting: Emergency Medicine

## 2014-08-09 DIAGNOSIS — Z79899 Other long term (current) drug therapy: Secondary | ICD-10-CM | POA: Diagnosis not present

## 2014-08-09 DIAGNOSIS — R011 Cardiac murmur, unspecified: Secondary | ICD-10-CM | POA: Diagnosis not present

## 2014-08-09 DIAGNOSIS — R319 Hematuria, unspecified: Secondary | ICD-10-CM | POA: Diagnosis not present

## 2014-08-09 DIAGNOSIS — M545 Low back pain, unspecified: Secondary | ICD-10-CM | POA: Diagnosis present

## 2014-08-09 DIAGNOSIS — Z8619 Personal history of other infectious and parasitic diseases: Secondary | ICD-10-CM | POA: Insufficient documentation

## 2014-08-09 DIAGNOSIS — M109 Gout, unspecified: Secondary | ICD-10-CM | POA: Insufficient documentation

## 2014-08-09 DIAGNOSIS — N183 Chronic kidney disease, stage 3 unspecified: Secondary | ICD-10-CM | POA: Diagnosis not present

## 2014-08-09 DIAGNOSIS — G43909 Migraine, unspecified, not intractable, without status migrainosus: Secondary | ICD-10-CM | POA: Diagnosis not present

## 2014-08-09 DIAGNOSIS — I2510 Atherosclerotic heart disease of native coronary artery without angina pectoris: Secondary | ICD-10-CM | POA: Insufficient documentation

## 2014-08-09 DIAGNOSIS — Z86718 Personal history of other venous thrombosis and embolism: Secondary | ICD-10-CM | POA: Diagnosis not present

## 2014-08-09 DIAGNOSIS — I252 Old myocardial infarction: Secondary | ICD-10-CM | POA: Diagnosis not present

## 2014-08-09 DIAGNOSIS — Z8701 Personal history of pneumonia (recurrent): Secondary | ICD-10-CM | POA: Diagnosis not present

## 2014-08-09 DIAGNOSIS — K219 Gastro-esophageal reflux disease without esophagitis: Secondary | ICD-10-CM | POA: Insufficient documentation

## 2014-08-09 DIAGNOSIS — Z880 Allergy status to penicillin: Secondary | ICD-10-CM | POA: Insufficient documentation

## 2014-08-09 DIAGNOSIS — I5030 Unspecified diastolic (congestive) heart failure: Secondary | ICD-10-CM | POA: Insufficient documentation

## 2014-08-09 DIAGNOSIS — Z95810 Presence of automatic (implantable) cardiac defibrillator: Secondary | ICD-10-CM | POA: Diagnosis not present

## 2014-08-09 LAB — CBC WITH DIFFERENTIAL/PLATELET
BASOS PCT: 0 % (ref 0–1)
Basophils Absolute: 0 10*3/uL (ref 0.0–0.1)
Eosinophils Absolute: 0.1 10*3/uL (ref 0.0–0.7)
Eosinophils Relative: 3 % (ref 0–5)
HEMATOCRIT: 40.9 % (ref 39.0–52.0)
HEMOGLOBIN: 14.6 g/dL (ref 13.0–17.0)
Lymphocytes Relative: 33 % (ref 12–46)
Lymphs Abs: 1.3 10*3/uL (ref 0.7–4.0)
MCH: 32.1 pg (ref 26.0–34.0)
MCHC: 35.7 g/dL (ref 30.0–36.0)
MCV: 89.9 fL (ref 78.0–100.0)
MONO ABS: 0.4 10*3/uL (ref 0.1–1.0)
MONOS PCT: 9 % (ref 3–12)
NEUTROS ABS: 2.2 10*3/uL (ref 1.7–7.7)
NEUTROS PCT: 56 % (ref 43–77)
Platelets: 98 10*3/uL — ABNORMAL LOW (ref 150–400)
RBC: 4.55 MIL/uL (ref 4.22–5.81)
RDW: 14.8 % (ref 11.5–15.5)
WBC: 4 10*3/uL (ref 4.0–10.5)

## 2014-08-09 LAB — URINE MICROSCOPIC-ADD ON

## 2014-08-09 LAB — URINALYSIS, ROUTINE W REFLEX MICROSCOPIC
BILIRUBIN URINE: NEGATIVE
Glucose, UA: NEGATIVE mg/dL
KETONES UR: NEGATIVE mg/dL
Leukocytes, UA: NEGATIVE
NITRITE: NEGATIVE
Protein, ur: 100 mg/dL — AB
SPECIFIC GRAVITY, URINE: 1.015 (ref 1.005–1.030)
UROBILINOGEN UA: 0.2 mg/dL (ref 0.0–1.0)
pH: 5.5 (ref 5.0–8.0)

## 2014-08-09 NOTE — ED Notes (Signed)
Pt in c/o mid to lower back pain, states pain is to left and right side, describes as burning but it does not seem to be a muscular pain, denies n/v, last BM today, no distress noted

## 2014-08-10 LAB — COMPREHENSIVE METABOLIC PANEL
ALBUMIN: 3.7 g/dL (ref 3.5–5.2)
ALT: 24 U/L (ref 0–53)
ANION GAP: 14 (ref 5–15)
AST: 33 U/L (ref 0–37)
Alkaline Phosphatase: 81 U/L (ref 39–117)
BUN: 19 mg/dL (ref 6–23)
CHLORIDE: 103 meq/L (ref 96–112)
CO2: 23 mEq/L (ref 19–32)
CREATININE: 1.5 mg/dL — AB (ref 0.50–1.35)
Calcium: 9.4 mg/dL (ref 8.4–10.5)
GFR calc Af Amer: 60 mL/min — ABNORMAL LOW (ref >90)
GFR calc non Af Amer: 52 mL/min — ABNORMAL LOW (ref >90)
GLUCOSE: 109 mg/dL — AB (ref 70–99)
POTASSIUM: 4 meq/L (ref 3.7–5.3)
Sodium: 140 mEq/L (ref 137–147)
Total Protein: 7.1 g/dL (ref 6.0–8.3)

## 2014-08-10 LAB — LIPASE, BLOOD: Lipase: 73 U/L — ABNORMAL HIGH (ref 11–59)

## 2014-08-10 MED ORDER — HYDROCODONE-ACETAMINOPHEN 5-325 MG PO TABS
2.0000 | ORAL_TABLET | Freq: Once | ORAL | Status: AC
  Administered 2014-08-10: 2 via ORAL
  Filled 2014-08-10: qty 2

## 2014-08-10 MED ORDER — METHOCARBAMOL 500 MG PO TABS
1000.0000 mg | ORAL_TABLET | Freq: Once | ORAL | Status: AC
  Administered 2014-08-10: 1000 mg via ORAL
  Filled 2014-08-10: qty 2

## 2014-08-10 MED ORDER — HYDROCODONE-ACETAMINOPHEN 5-325 MG PO TABS: 1.0000 | ORAL_TABLET | Freq: Four times a day (QID) | ORAL | Status: DC | PRN

## 2014-08-10 MED ORDER — METHOCARBAMOL 750 MG PO TABS: 750.0000 mg | ORAL_TABLET | Freq: Four times a day (QID) | ORAL | Status: DC

## 2014-08-10 NOTE — Discharge Instructions (Signed)
Please return to the ER for worsening condition.  Avoid use of NSAIDs given the fact you have one kidney.  These include Motrin, Ibuprofen, Naprosyn, Aleve.  Take medications as prescribed.  You were noted to have some blood in your urine, please follow up with urology for further evaluation.   Back Exercises Back exercises help treat and prevent back injuries. The goal is to increase your strength in your belly (abdominal) and back muscles. These exercises can also help with flexibility. Start these exercises when told by your doctor. HOME CARE Back exercises include: Pelvic Tilt.  Lie on your back with your knees bent. Tilt your pelvis until the lower part of your back is against the floor. Hold this position 5 to 10 sec. Repeat this exercise 5 to 10 times. Knee to Chest.  Pull 1 knee up against your chest and hold for 20 to 30 seconds. Repeat this with the other knee. This may be done with the other leg straight or bent, whichever feels better. Then, pull both knees up against your chest. Sit-Ups or Curl-Ups.  Bend your knees 90 degrees. Start with tilting your pelvis, and do a partial, slow sit-up. Only lift your upper half 30 to 45 degrees off the floor. Take at least 2 to 3 seonds for each sit-up. Do not do sit-ups with your knees out straight. If partial sit-ups are difficult, simply do the above but with only tightening your belly (abdominal) muscles and holding it as told. Hip-Lift.  Lie on your back with your knees flexed 90 degrees. Push down with your feet and shoulders as you raise your hips 2 inches off the floor. Hold for 10 seconds, repeat 5 to 10 times. Back Arches.  Lie on your stomach. Prop yourself up on bent elbows. Slowly press on your hands, causing an arch in your low back. Repeat 3 to 5 times. Shoulder-Lifts.  Lie face down with arms beside your body. Keep hips and belly pressed to floor as you slowly lift your head and shoulders off the floor. Do not overdo your  exercises. Be careful in the beginning. Exercises may cause you some mild back discomfort. If the pain lasts for more than 15 minutes, stop the exercises until you see your doctor. Improvement with exercise for back problems is slow.  Document Released: 12/03/2010 Document Revised: 01/23/2012 Document Reviewed: 09/01/2011 [facility] Patient Information 2015 [place], [place]. This information is not intended to replace advice given to you by your health care provider. Make sure you discuss any questions you have with your health care provider.  Back Pain, Adult Low back pain is very common. About 1 in 5 people have back pain.The cause of low back pain is rarely dangerous. The pain often gets better over time.About half of people with a sudden onset of back pain feel better in just 2 weeks. About 8 in 10 people feel better by 6 weeks.  CAUSES Some common causes of back pain include:  Strain of the muscles or ligaments supporting the spine.  Wear and tear (degeneration) of the spinal discs.  Arthritis.  Direct injury to the back. DIAGNOSIS Most of the time, the direct cause of low back pain is not known.However, back pain can be treated effectively even when the exact cause of the pain is unknown.Answering your caregiver's questions about your overall health and symptoms is one of the most accurate ways to make sure the cause of your pain is not dangerous. If your caregiver needs more information, he  or she may order lab work or imaging tests (X-rays or MRIs).However, even if imaging tests show changes in your back, this usually does not require surgery. HOME CARE INSTRUCTIONS For many people, back pain returns.Since low back pain is rarely dangerous, it is often a condition that people can learn to [facility] their own.   Remain active. It is stressful on the back to sit or stand in one place. Do not sit, drive, or stand in one place for more than 30 minutes at a time. Take short walks on level  surfaces as soon as pain allows.Try to increase the length of time you walk each day.  Do not stay in bed.Resting more than 1 or 2 days can delay your recovery.  Do not avoid exercise or work.Your body is made to move.It is not dangerous to be active, even though your back may hurt.Your back will likely heal faster if you return to being active before your pain is gone.  Pay attention to your body when you bend and lift. Many people have less discomfortwhen lifting if they bend their knees, keep the load close to their bodies,and avoid twisting. Often, the most comfortable positions are those that put less stress on your recovering back.  Find a comfortable position to sleep. Use a firm mattress and lie on your side with your knees slightly bent. If you lie on your back, put a pillow under your knees.  Only take over-the-counter or prescription medicines as directed by your caregiver. Over-the-counter medicines to reduce pain and inflammation are often the most helpful.Your caregiver may prescribe muscle relaxant drugs.These medicines help dull your pain so you can more quickly return to your normal activities and healthy exercise.  Put ice on the injured area.  Put ice in a plastic bag.  Place a towel between your skin and the bag.  Leave the ice on for 15-20 minutes, 03-04 times a day for the first 2 to 3 days. After that, ice and heat may be alternated to reduce pain and spasms.  Ask your caregiver about trying back exercises and gentle massage. This may be of some benefit.  Avoid feeling anxious or stressed.Stress increases muscle tension and can worsen back pain.It is important to recognize when you are anxious or stressed and learn ways to manage it.Exercise is a great option. SEEK MEDICAL CARE IF:  You have pain that is not relieved with rest or medicine.  You have pain that does not improve in 1 week.  You have new symptoms.  You are generally not feeling  well. SEEK IMMEDIATE MEDICAL CARE IF:   You have pain that radiates from your back into your legs.  You develop new bowel or bladder control problems.  You have unusual weakness or numbness in your arms or legs.  You develop nausea or vomiting.  You develop abdominal pain.  You feel faint. Document Released: 10/31/2005 Document Revised: 05/01/2012 Document Reviewed: 03/04/2014 [facility] Patient Information 2015 [place], [place]. This information is not intended to replace advice given to you by your health care provider. Make sure you discuss any questions you have with your health care provider.  Hematuria Hematuria is blood in your urine. It can be caused by a bladder infection, kidney infection, prostate infection, kidney stone, or cancer of your urinary tract. Infections can usually be treated with medicine, and a kidney stone usually will pass through your urine. If neither of these is the cause of your hematuria, further workup to find out the  reason may be needed. It is very important that you tell your health care provider about any blood you see in your urine, even if the blood stops without treatment or happens without causing pain. Blood in your urine that happens and then stops and then happens again can be a symptom of a very serious condition. Also, pain is not a symptom in the initial stages of many urinary cancers. HOME CARE INSTRUCTIONS   Drink lots of fluid, 3-4 quarts a day. If you have been diagnosed with an infection, cranberry juice is especially recommended, in addition to large amounts of water.  Avoid caffeine, tea, and carbonated beverages because they tend to irritate the bladder.  Avoid alcohol because it may irritate the prostate.  Take all medicines as directed by your health care provider.  If you were prescribed an antibiotic medicine, finish it all even if you start to feel better.  If you have been diagnosed with a kidney stone, follow your health care  provider's instructions regarding straining your urine to catch the stone.  Empty your bladder often. Avoid holding urine for long periods of time.  After a bowel movement, women should cleanse front to back. Use each tissue only once.  Empty your bladder before and after sexual intercourse if you are a male. SEEK MEDICAL CARE IF:  You develop back pain.  You have a fever.  You have a feeling of sickness in your stomach (nausea) or vomiting.  Your symptoms are not better in 3 days. Return sooner if you are getting worse. SEEK IMMEDIATE MEDICAL CARE IF:   You develop severe vomiting and are unable to keep the medicine down.  You develop severe back or abdominal pain despite taking your medicines.  You begin passing a large amount of blood or clots in your urine.  You feel extremely weak or faint, or you pass out. MAKE SURE YOU:   Understand these instructions.  Will watch your condition.  Will get help right away if you are not doing well or get worse. Document Released: 10/31/2005 Document Revised: 03/17/2014 Document Reviewed: 07/01/2013 [facility] Patient Information 2015 [place], [place]. This information is not intended to replace advice given to you by your health care provider. Make sure you discuss any questions you have with your health care provider.
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
## 2014-08-29 ENCOUNTER — Ambulatory Visit (HOSPITAL_BASED_OUTPATIENT_CLINIC_OR_DEPARTMENT_OTHER): Payer: MEDICARE

## 2014-08-29 ENCOUNTER — Other Ambulatory Visit (HOSPITAL_BASED_OUTPATIENT_CLINIC_OR_DEPARTMENT_OTHER): Payer: MEDICARE

## 2014-08-29 DIAGNOSIS — D751 Secondary polycythemia: Secondary | ICD-10-CM

## 2014-08-29 DIAGNOSIS — Z23 Encounter for immunization: Secondary | ICD-10-CM

## 2014-08-29 DIAGNOSIS — D45 Polycythemia vera: Secondary | ICD-10-CM

## 2014-08-29 DIAGNOSIS — I82401 Acute embolism and thrombosis of unspecified deep veins of right lower extremity: Secondary | ICD-10-CM

## 2014-08-29 LAB — CBC WITH DIFFERENTIAL/PLATELET
BASO%: 0.7 % (ref 0.0–2.0)
BASOS ABS: 0 10*3/uL (ref 0.0–0.1)
EOS%: 1.9 % (ref 0.0–7.0)
Eosinophils Absolute: 0.1 10*3/uL (ref 0.0–0.5)
HCT: 44.9 % (ref 38.4–49.9)
HGB: 14.8 g/dL (ref 13.0–17.1)
LYMPH%: 20.5 % (ref 14.0–49.0)
MCH: 31.1 pg (ref 27.2–33.4)
MCHC: 32.9 g/dL (ref 32.0–36.0)
MCV: 94.6 fL (ref 79.3–98.0)
MONO#: 0.4 10*3/uL (ref 0.1–0.9)
MONO%: 8.2 % (ref 0.0–14.0)
NEUT%: 68.7 % (ref 39.0–75.0)
NEUTROS ABS: 3.4 10*3/uL (ref 1.5–6.5)
PLATELETS: 113 10*3/uL — AB (ref 140–400)
RBC: 4.75 10*6/uL (ref 4.20–5.82)
RDW: 16.8 % — ABNORMAL HIGH (ref 11.0–14.6)
WBC: 5 10*3/uL (ref 4.0–10.3)
lymph#: 1 10*3/uL (ref 0.9–3.3)

## 2014-08-29 MED ORDER — INFLUENZA VAC SPLIT QUAD 0.5 ML IM SUSY
0.5000 mL | PREFILLED_SYRINGE | Freq: Once | INTRAMUSCULAR | Status: AC
  Administered 2014-08-29: 0.5 mL via INTRAMUSCULAR
  Filled 2014-08-29: qty 0.5

## 2014-08-29 NOTE — Progress Notes (Addendum)
hct-44.9, per Dr. Juliann Mule office note in Aug, phlebotomy when hct greater than 45. Patient requesting flu shot. Flu shot given and documented per protocol. Flu VIS, lab work, AVS given to patient.

## 2014-09-24 ENCOUNTER — Encounter (HOSPITAL_COMMUNITY): Payer: Self-pay | Admitting: Emergency Medicine

## 2014-09-24 ENCOUNTER — Emergency Department (HOSPITAL_COMMUNITY)
Admission: EM | Admit: 2014-09-24 | Discharge: 2014-09-24 | Disposition: A | Discharge status: Home / Self Care | Payer: MEDICARE | Attending: Emergency Medicine | Admitting: Emergency Medicine

## 2014-09-24 DIAGNOSIS — M109 Gout, unspecified: Secondary | ICD-10-CM | POA: Diagnosis not present

## 2014-09-24 DIAGNOSIS — Y9389 Activity, other specified: Secondary | ICD-10-CM | POA: Diagnosis not present

## 2014-09-24 DIAGNOSIS — I252 Old myocardial infarction: Secondary | ICD-10-CM | POA: Diagnosis not present

## 2014-09-24 DIAGNOSIS — Z8619 Personal history of other infectious and parasitic diseases: Secondary | ICD-10-CM | POA: Diagnosis not present

## 2014-09-24 DIAGNOSIS — S4991XA Unspecified injury of right shoulder and upper arm, initial encounter: Secondary | ICD-10-CM | POA: Insufficient documentation

## 2014-09-24 DIAGNOSIS — N183 Chronic kidney disease, stage 3 (moderate): Secondary | ICD-10-CM | POA: Diagnosis not present

## 2014-09-24 DIAGNOSIS — I25119 Atherosclerotic heart disease of native coronary artery with unspecified angina pectoris: Secondary | ICD-10-CM | POA: Diagnosis not present

## 2014-09-24 DIAGNOSIS — Y9289 Other specified places as the place of occurrence of the external cause: Secondary | ICD-10-CM | POA: Diagnosis not present

## 2014-09-24 DIAGNOSIS — Z79899 Other long term (current) drug therapy: Secondary | ICD-10-CM | POA: Insufficient documentation

## 2014-09-24 DIAGNOSIS — X58XXXA Exposure to other specified factors, initial encounter: Secondary | ICD-10-CM | POA: Diagnosis not present

## 2014-09-24 DIAGNOSIS — Z86718 Personal history of other venous thrombosis and embolism: Secondary | ICD-10-CM | POA: Insufficient documentation

## 2014-09-24 DIAGNOSIS — K219 Gastro-esophageal reflux disease without esophagitis: Secondary | ICD-10-CM | POA: Insufficient documentation

## 2014-09-24 DIAGNOSIS — S59901A Unspecified injury of right elbow, initial encounter: Secondary | ICD-10-CM

## 2014-09-24 DIAGNOSIS — Z95810 Presence of automatic (implantable) cardiac defibrillator: Secondary | ICD-10-CM | POA: Diagnosis not present

## 2014-09-24 DIAGNOSIS — Z8701 Personal history of pneumonia (recurrent): Secondary | ICD-10-CM | POA: Diagnosis not present

## 2014-09-24 DIAGNOSIS — Z880 Allergy status to penicillin: Secondary | ICD-10-CM | POA: Diagnosis not present

## 2014-09-24 DIAGNOSIS — Z8669 Personal history of other diseases of the nervous system and sense organs: Secondary | ICD-10-CM | POA: Insufficient documentation

## 2014-09-24 DIAGNOSIS — R011 Cardiac murmur, unspecified: Secondary | ICD-10-CM | POA: Insufficient documentation

## 2014-09-24 DIAGNOSIS — Z8639 Personal history of other endocrine, nutritional and metabolic disease: Secondary | ICD-10-CM | POA: Insufficient documentation

## 2014-09-24 DIAGNOSIS — Z862 Personal history of diseases of the blood and blood-forming organs and certain disorders involving the immune mechanism: Secondary | ICD-10-CM | POA: Diagnosis not present

## 2014-09-24 DIAGNOSIS — Y998 Other external cause status: Secondary | ICD-10-CM | POA: Insufficient documentation

## 2014-09-24 DIAGNOSIS — I5030 Unspecified diastolic (congestive) heart failure: Secondary | ICD-10-CM | POA: Diagnosis not present

## 2014-09-24 DIAGNOSIS — M25511 Pain in right shoulder: Secondary | ICD-10-CM

## 2014-09-24 MED ORDER — METHOCARBAMOL 750 MG PO TABS: 750.0000 mg | ORAL_TABLET | Freq: Four times a day (QID) | ORAL | Status: DC

## 2014-09-24 NOTE — ED Notes (Signed)
The patient said he has been having elbow pain that radiates to his shoulder.  He is an obvious weight lifter and he says it is just wear and tear.  He rates his pain 9/10.
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
## 2014-09-24 NOTE — Discharge Instructions (Signed)
Please read and follow all provided instructions.  Your diagnoses today include:  1. Elbow injury, right, initial encounter   2. Right shoulder pain     Tests performed today include:  Vital signs. See below for your results today.   Medications prescribed:   Robaxin (methocarbamol) - muscle relaxer medication  DO NOT drive or perform any activities that require you to be awake and alert because this medicine can make you drowsy.   Use tylenol as directed on the packaging for pain.   Take any prescribed medications only as directed.  Home care instructions:   Follow any educational materials contained in this packet  Follow R.I.C.E. Protocol:  R - rest your injury   I  - use ice on injury without applying directly to skin  C - compress injury with bandage or splint  E - elevate the injury as much as possible  Follow-up instructions: Please follow-up with your primary care provider or the provided orthopedic physician (bone specialist) if you continue to have significant pain in 1 week. In this case you may have a severe injury that requires further care.   Return instructions:   Please return if your toes are numb or tingling, appear gray or blue, or you have severe pain (also elevate leg and loosen splint or wrap if you were given one)  Please return to the Emergency Department if you experience worsening symptoms.   Please return if you have any other emergent concerns.  Additional Information:  Your vital signs today were: BP 136/86 mmHg   Pulse 70   Temp(Src) 97.9 F (36.6 C) (Oral)   Resp 16   Ht 5\' 6"  (1.676 m)   Wt 188 lb (85.276 kg)   BMI 30.36 kg/m2   SpO2 100% If your blood pressure (BP) was elevated above 135/85 this visit, please have this repeated by your doctor within one month.

## 2014-09-25 ENCOUNTER — Other Ambulatory Visit: Payer: Self-pay

## 2014-09-25 DIAGNOSIS — D751 Secondary polycythemia: Secondary | ICD-10-CM

## 2014-09-25 DIAGNOSIS — D696 Thrombocytopenia, unspecified: Secondary | ICD-10-CM

## 2014-09-26 ENCOUNTER — Ambulatory Visit (HOSPITAL_BASED_OUTPATIENT_CLINIC_OR_DEPARTMENT_OTHER): Payer: MEDICARE | Admitting: Hematology

## 2014-09-26 ENCOUNTER — Other Ambulatory Visit (HOSPITAL_BASED_OUTPATIENT_CLINIC_OR_DEPARTMENT_OTHER): Payer: MEDICARE

## 2014-09-26 ENCOUNTER — Ambulatory Visit (HOSPITAL_BASED_OUTPATIENT_CLINIC_OR_DEPARTMENT_OTHER): Payer: MEDICARE

## 2014-09-26 ENCOUNTER — Telehealth: Payer: Self-pay | Admitting: Hematology

## 2014-09-26 VITALS — BP 129/87 | HR 81 | Temp 98.5°F | Resp 20 | Ht 66.0 in | Wt 183.9 lb

## 2014-09-26 DIAGNOSIS — D751 Secondary polycythemia: Secondary | ICD-10-CM

## 2014-09-26 DIAGNOSIS — G4730 Sleep apnea, unspecified: Secondary | ICD-10-CM

## 2014-09-26 DIAGNOSIS — D696 Thrombocytopenia, unspecified: Secondary | ICD-10-CM

## 2014-09-26 DIAGNOSIS — M7711 Lateral epicondylitis, right elbow: Secondary | ICD-10-CM

## 2014-09-26 LAB — CBC WITH DIFFERENTIAL/PLATELET
BASO%: 0.9 % (ref 0.0–2.0)
Basophils Absolute: 0 10*3/uL (ref 0.0–0.1)
EOS ABS: 0.1 10*3/uL (ref 0.0–0.5)
EOS%: 1.7 % (ref 0.0–7.0)
HEMATOCRIT: 47.3 % (ref 38.4–49.9)
HGB: 15.7 g/dL (ref 13.0–17.1)
LYMPH%: 24.6 % (ref 14.0–49.0)
MCH: 32 pg (ref 27.2–33.4)
MCHC: 33.3 g/dL (ref 32.0–36.0)
MCV: 96.2 fL (ref 79.3–98.0)
MONO#: 0.4 10*3/uL (ref 0.1–0.9)
MONO%: 8.9 % (ref 0.0–14.0)
NEUT%: 63.9 % (ref 39.0–75.0)
NEUTROS ABS: 2.7 10*3/uL (ref 1.5–6.5)
PLATELETS: 117 10*3/uL — AB (ref 140–400)
RBC: 4.91 10*6/uL (ref 4.20–5.82)
RDW: 16 % — ABNORMAL HIGH (ref 11.0–14.6)
WBC: 4.2 10*3/uL (ref 4.0–10.3)
lymph#: 1 10*3/uL (ref 0.9–3.3)

## 2014-09-26 LAB — COMPREHENSIVE METABOLIC PANEL (CC13)
ALT: 28 U/L (ref 0–55)
AST: 33 U/L (ref 5–34)
Albumin: 3.8 g/dL (ref 3.5–5.0)
Alkaline Phosphatase: 93 U/L (ref 40–150)
Anion Gap: 9 mEq/L (ref 3–11)
BILIRUBIN TOTAL: 0.41 mg/dL (ref 0.20–1.20)
BUN: 19.6 mg/dL (ref 7.0–26.0)
CO2: 24 meq/L (ref 22–29)
CREATININE: 1.6 mg/dL — AB (ref 0.7–1.3)
Calcium: 9.6 mg/dL (ref 8.4–10.4)
Chloride: 105 mEq/L (ref 98–109)
GLUCOSE: 68 mg/dL — AB (ref 70–140)
Potassium: 4.2 mEq/L (ref 3.5–5.1)
Sodium: 138 mEq/L (ref 136–145)
Total Protein: 7.7 g/dL (ref 6.4–8.3)

## 2014-09-26 LAB — LACTATE DEHYDROGENASE (CC13): LDH: 294 U/L — AB (ref 125–245)

## 2014-09-26 NOTE — Telephone Encounter (Signed)
gave avs & cal for Jan -May

## 2014-09-26 NOTE — Progress Notes (Signed)
VSS post Phlebotomy 

## 2014-09-26 NOTE — Progress Notes (Signed)
Discharged at 1750, alone, ambulatory in no distress.

## 2014-09-26 NOTE — Patient Instructions (Signed)

## 2014-09-26 NOTE — Progress Notes (Signed)
Phlebotomy started at [name] Moreno.  Slowed and no further drainage at 1710.  Patient denies any problems but right hand flushed.  D/C'd after repositioning needle and noted one slow drip.

## 2014-09-28 ENCOUNTER — Encounter: Payer: Self-pay | Admitting: Hematology
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
## 2014-09-29 ENCOUNTER — Telehealth: Payer: Self-pay | Admitting: Family Medicine

## 2014-09-29 ENCOUNTER — Telehealth: Payer: Self-pay | Admitting: *Deleted

## 2014-09-29 ENCOUNTER — Telehealth: Payer: Self-pay | Admitting: Hematology

## 2014-09-29 NOTE — Telephone Encounter (Signed)
Per staff message and POF I have scheduled appts. Advised scheduler of appts. JMW  

## 2014-09-29 NOTE — Telephone Encounter (Signed)
Confirm appt for referral's.

## 2014-09-29 NOTE — Telephone Encounter (Signed)
ER letter sent 

## 2014-11-21 ENCOUNTER — Other Ambulatory Visit: Payer: MEDICARE

## 2014-11-23 ENCOUNTER — Encounter (HOSPITAL_COMMUNITY): Payer: Self-pay | Admitting: Adult Health

## 2014-11-23 ENCOUNTER — Emergency Department (HOSPITAL_COMMUNITY)
Admission: EM | Admit: 2014-11-23 | Discharge: 2014-11-23 | Disposition: A | Discharge status: Home / Self Care | Payer: Medicare Other | Attending: Emergency Medicine | Admitting: Emergency Medicine

## 2014-11-23 DIAGNOSIS — R011 Cardiac murmur, unspecified: Secondary | ICD-10-CM | POA: Insufficient documentation

## 2014-11-23 DIAGNOSIS — I2510 Atherosclerotic heart disease of native coronary artery without angina pectoris: Secondary | ICD-10-CM | POA: Diagnosis not present

## 2014-11-23 DIAGNOSIS — Z87828 Personal history of other (healed) physical injury and trauma: Secondary | ICD-10-CM | POA: Insufficient documentation

## 2014-11-23 DIAGNOSIS — Z8701 Personal history of pneumonia (recurrent): Secondary | ICD-10-CM | POA: Diagnosis not present

## 2014-11-23 DIAGNOSIS — G43909 Migraine, unspecified, not intractable, without status migrainosus: Secondary | ICD-10-CM | POA: Insufficient documentation

## 2014-11-23 DIAGNOSIS — Z862 Personal history of diseases of the blood and blood-forming organs and certain disorders involving the immune mechanism: Secondary | ICD-10-CM | POA: Diagnosis not present

## 2014-11-23 DIAGNOSIS — Z86718 Personal history of other venous thrombosis and embolism: Secondary | ICD-10-CM | POA: Diagnosis not present

## 2014-11-23 DIAGNOSIS — I252 Old myocardial infarction: Secondary | ICD-10-CM | POA: Diagnosis not present

## 2014-11-23 DIAGNOSIS — Z95810 Presence of automatic (implantable) cardiac defibrillator: Secondary | ICD-10-CM | POA: Insufficient documentation

## 2014-11-23 DIAGNOSIS — I5030 Unspecified diastolic (congestive) heart failure: Secondary | ICD-10-CM | POA: Diagnosis not present

## 2014-11-23 DIAGNOSIS — M79621 Pain in right upper arm: Secondary | ICD-10-CM | POA: Insufficient documentation

## 2014-11-23 DIAGNOSIS — Z79899 Other long term (current) drug therapy: Secondary | ICD-10-CM | POA: Diagnosis not present

## 2014-11-23 DIAGNOSIS — Z880 Allergy status to penicillin: Secondary | ICD-10-CM | POA: Insufficient documentation

## 2014-11-23 DIAGNOSIS — Z8639 Personal history of other endocrine, nutritional and metabolic disease: Secondary | ICD-10-CM | POA: Diagnosis not present

## 2014-11-23 DIAGNOSIS — K219 Gastro-esophageal reflux disease without esophagitis: Secondary | ICD-10-CM | POA: Insufficient documentation

## 2014-11-23 DIAGNOSIS — Z8709 Personal history of other diseases of the respiratory system: Secondary | ICD-10-CM | POA: Insufficient documentation

## 2014-11-23 DIAGNOSIS — M79601 Pain in right arm: Secondary | ICD-10-CM | POA: Diagnosis present

## 2014-11-23 DIAGNOSIS — Z8619 Personal history of other infectious and parasitic diseases: Secondary | ICD-10-CM | POA: Diagnosis not present

## 2014-11-23 DIAGNOSIS — N183 Chronic kidney disease, stage 3 (moderate): Secondary | ICD-10-CM | POA: Diagnosis not present

## 2014-11-23 MED ORDER — CYCLOBENZAPRINE HCL 10 MG PO TABS: 10.0000 mg | ORAL_TABLET | Freq: Two times a day (BID) | ORAL | Status: DC | PRN

## 2014-11-23 MED ORDER — CYCLOBENZAPRINE HCL 10 MG PO TABS
10.0000 mg | ORAL_TABLET | Freq: Once | ORAL | Status: AC
  Administered 2014-11-23: 10 mg via ORAL
  Filled 2014-11-23: qty 1

## 2014-11-23 MED ORDER — NAPROXEN 500 MG PO TABS: 500.0000 mg | ORAL_TABLET | Freq: Two times a day (BID) | ORAL | Status: DC

## 2014-11-23 MED ORDER — NAPROXEN 250 MG PO TABS
500.0000 mg | ORAL_TABLET | Freq: Once | ORAL | Status: AC
  Administered 2014-11-23: 500 mg via ORAL
  Filled 2014-11-23: qty 2

## 2014-11-23 NOTE — ED Notes (Signed)
Presents with one month and a half of right arm pain, from bicep to forearm. He has seen a sports medicine doctor who gave him a brace which helps slightly. Pain with trying to lift things, relaxing also causes pain. HX of torn bicep in that arm years ago.

## 2014-11-23 NOTE — ED Notes (Signed)
Pt A&OX4, ambulatory at d/c with steady gait, NAD 

## 2014-11-23 NOTE — Discharge Instructions (Signed)
Please follow the directions provided. Be sure to follow-up with your sports medicine doctor or the orthopedic doctor for further management of this pain in your arm. May take the naproxen twice a day to help with inflammation and the Flexeril twice a day also to help with muscle spasms. Don't hesitate to return for any new, worsening, or concerning symptoms.    SEEK MEDICAL CARE IF:  Symptoms get worse or do not improve in 2 weeks, despite treatment.  You experience pain, numbness, or coldness in the hand.  Blue, gray, or dark color appears in the fingernails.  Any of the following occur after surgery: increased pain, swelling, redness, drainage of fluids, bleeding in the affected area, or signs of infection.  New, unexplained symptoms develop.
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
## 2014-11-26 ENCOUNTER — Encounter: Payer: Self-pay | Admitting: Pulmonary Disease

## 2014-11-26 ENCOUNTER — Ambulatory Visit (INDEPENDENT_AMBULATORY_CARE_PROVIDER_SITE_OTHER): Payer: Medicare Other | Admitting: Pulmonary Disease

## 2014-11-26 VITALS — BP 120/82 | HR 61 | Temp 98.0°F | Ht 66.0 in | Wt 187.2 lb

## 2014-11-26 DIAGNOSIS — G4733 Obstructive sleep apnea (adult) (pediatric): Secondary | ICD-10-CM

## 2014-11-26 NOTE — Patient Instructions (Signed)
Schedule sleep study

## 2014-11-26 NOTE — Assessment & Plan Note (Signed)
Given excessive daytime somnolence, narrow pharyngeal exam, witnessed apneas & loud snoring, obstructive sleep apnea is very likely & an overnight polysomnogram will be scheduled as a split study. The pathophysiology of obstructive sleep apnea , it's cardiovascular consequences & modes of treatment including CPAP were discused with the patient in detail & they evidenced understanding.  
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
## 2014-11-28 ENCOUNTER — Other Ambulatory Visit: Payer: Self-pay | Admitting: *Deleted

## 2014-11-28 ENCOUNTER — Other Ambulatory Visit: Payer: Self-pay

## 2014-11-28 ENCOUNTER — Telehealth: Payer: Self-pay | Admitting: *Deleted

## 2014-11-28 ENCOUNTER — Ambulatory Visit: Payer: Medicare Other

## 2014-11-28 DIAGNOSIS — D751 Secondary polycythemia: Secondary | ICD-10-CM

## 2014-11-28 NOTE — Telephone Encounter (Signed)
Per staff message I have moved appt from today to next week

## 2014-12-01 ENCOUNTER — Ambulatory Visit (HOSPITAL_BASED_OUTPATIENT_CLINIC_OR_DEPARTMENT_OTHER): Payer: Medicare Other | Attending: Pulmonary Disease

## 2014-12-01 DIAGNOSIS — G4733 Obstructive sleep apnea (adult) (pediatric): Secondary | ICD-10-CM | POA: Diagnosis not present

## 2014-12-02 ENCOUNTER — Other Ambulatory Visit: Payer: Self-pay | Admitting: Pulmonary Disease

## 2014-12-02 ENCOUNTER — Telehealth: Payer: Self-pay | Admitting: Family Medicine

## 2014-12-02 DIAGNOSIS — G4733 Obstructive sleep apnea (adult) (pediatric): Secondary | ICD-10-CM

## 2014-12-02 NOTE — Telephone Encounter (Signed)
ED letter sent

## 2014-12-05 ENCOUNTER — Other Ambulatory Visit (HOSPITAL_BASED_OUTPATIENT_CLINIC_OR_DEPARTMENT_OTHER): Payer: Medicare Other

## 2014-12-05 ENCOUNTER — Ambulatory Visit: Payer: Medicare Other

## 2014-12-05 DIAGNOSIS — D751 Secondary polycythemia: Secondary | ICD-10-CM

## 2014-12-05 LAB — CBC WITH DIFFERENTIAL/PLATELET
BASO%: 0.3 % (ref 0.0–2.0)
BASOS ABS: 0 10*3/uL (ref 0.0–0.1)
EOS ABS: 0.1 10*3/uL (ref 0.0–0.5)
EOS%: 1.6 % (ref 0.0–7.0)
HEMATOCRIT: 41.1 % (ref 38.4–49.9)
HGB: 14.8 g/dL (ref 13.0–17.1)
LYMPH#: 1 10*3/uL (ref 0.9–3.3)
LYMPH%: 26.1 % (ref 14.0–49.0)
MCH: 35.8 pg — ABNORMAL HIGH (ref 27.2–33.4)
MCHC: 36 g/dL (ref 32.0–36.0)
MCV: 99.5 fL — ABNORMAL HIGH (ref 79.3–98.0)
MONO#: 0.4 10*3/uL (ref 0.1–0.9)
MONO%: 10 % (ref 0.0–14.0)
NEUT#: 2.4 10*3/uL (ref 1.5–6.5)
NEUT%: 62 % (ref 39.0–75.0)
NRBC: 1 % — AB (ref 0–0)
Platelets: 85 10*3/uL — ABNORMAL LOW (ref 140–400)
RBC: 4.13 10*6/uL — AB (ref 4.20–5.82)
RDW: 15.8 % — ABNORMAL HIGH (ref 11.0–14.6)
WBC: 3.8 10*3/uL — ABNORMAL LOW (ref 4.0–10.3)

## 2014-12-05 NOTE — Progress Notes (Signed)
HCT today 41.1.  Per office note, no phlebotomy needed if HCT is less than 45.  Spoke to patient in lobby.  Pt given copy of labs and d/c'd to home.  Pt verbalized understanding.

## 2014-12-08 ENCOUNTER — Telehealth: Payer: Self-pay | Admitting: *Deleted

## 2014-12-08 DIAGNOSIS — G4733 Obstructive sleep apnea (adult) (pediatric): Secondary | ICD-10-CM

## 2014-12-08 NOTE — Telephone Encounter (Signed)
lmtcb

## 2014-12-08 NOTE — Sleep Study (Addendum)
[place]   NAME: [name] Moreno OF BIRTH: 09-10-1961  MEDICAL RECORD [id number]  LOCATION: [facility]   PHYSICIAN: ALVA,RAKESH V.   DATE OF STUDY: 12/01/14   SLEEP STUDY TYPE: Nocturnal Polysomnogram   REFERRING PHYSICIAN: Rigoberto Noel, MD   INDICATION FOR STUDY:  54 year old with polycythemia, hypertrophic cardiomyopathy, loud snoring and excessive daytime somnolence. He had a sleep study in [place] many years ago which showed mild OSA but never placed on Cipro therapy At the time of this study ,they weighed 186 pounds with a height of 5 ft 6 inches and the BMI of 30, neck size of 18 inches. Epworth sleepiness score was 12   This nocturnal polysomnogram was performed with a sleep technologist in attendance. EEG, EOG,EMG and respiratory parameters recorded. Sleep stages, arousals, limb movements and respiratory data was scored according to criteria laid out by the American Academy of sleep medicine.   SLEEP ARCHITECTURE: Lights out was at 2250 PM and lights on was at 512 AM. Total sleep time was 318 minutes with a sleep period time of 337 minutes and a sleep efficiency of 84 %. Sleep latency was 44 minutes with latency to REM sleep of 315 minutes and wake after sleep onset of 19 minutes. . Sleep stages as a percentage of total sleep time was N1 -4 %,N2- 89 % and REM sleep 7 % ( 21 minutes) . The longest period of REM sleep was around 5 AM.   AROUSAL DATA : There were 74  arousals with an arousal index of 14 events per hour. Most of these were spontaneous & 11 were associated with respiratory events  RESPIRATORY DATA: There were 8 obstructive apneas, 0 central apneas, 0 mixed apneas and 21 hypopneas with apnea -hypopnea index of 5.5 events per hour. There were 1 RERAs with an RDI of 5.7 events per hour. Supine sleep was  Noted. Almost all events were noted during supine sleep   MOVEMENT/PARASOMNIA: There were317 PLMS with a PLM index  60vents per hour. The PLM arousal index wafor hour.  OXYGEN DATA: The lowest desaturation was 82 % during nREM sleep and the desaturation index was 5 per hour.   CARDIAC DATA: The low heart rate was  34beats per minute. The high heart rate recorded was an artifact. No arrhythmias were noted   DISCUSSION -Loud snoring was noted . he did not meet criteria for CPAP intervention. he was desensitized with medium fullface mask  IMPRESSION :  1.  Mild obstructive sleep apnea with hypopneas causing sleep fragmentation amild  oxygen desaturation.  events were only noted during supine sleep  2. No evidence of cardiac arrhythmias or behavioral disturbance during sleep.  3. Sleep efficiency was good 4. Severe periodic limb movements , but arousal index was low   RECOMMENDATION:  1. Treatment options for this degree of sleep disordered breathing include weight loss,  positional therapy ,CPAP therapy and/ or oral appliance.  2. Patient should be cautioned against driving when sleepy  3. They should be asked to avoid medications with sedative side effects  4. Correlate with clinical history of restless leg syndrome, may have to see if this goes away with C Pap therapy    Rigoberto Noel MD Diplomate, American Board of Sleep Medicine    ELECTRONICALLY SIGNED ON: 12/08/2014  [place]  PH: [phone] FX: [phone]  [place]

## 2014-12-08 NOTE — Telephone Encounter (Signed)
-----   Message from Rigoberto Noel, MD sent at 12/08/2014 12:12 PM EST ----- Needs follow-up office visit after sleep study-this week

## 2014-12-09 NOTE — Telephone Encounter (Signed)
lmtcb X1 for pt.    Spoke with Sharyn Lull, see if pt can come in at 1:30- ok to double book.

## 2014-12-09 NOTE — Telephone Encounter (Signed)
Pt returned call- [phone]

## 2014-12-09 NOTE — Telephone Encounter (Signed)
appt made. Nothing further needed.

## 2014-12-10 ENCOUNTER — Encounter: Payer: Self-pay | Admitting: Pulmonary Disease

## 2014-12-10 ENCOUNTER — Ambulatory Visit (INDEPENDENT_AMBULATORY_CARE_PROVIDER_SITE_OTHER): Payer: Medicare Other | Admitting: Pulmonary Disease

## 2014-12-10 VITALS — BP 116/78 | HR 62 | Ht 66.0 in | Wt 191.0 lb

## 2014-12-10 DIAGNOSIS — G4733 Obstructive sleep apnea (adult) (pediatric): Secondary | ICD-10-CM

## 2014-12-10 NOTE — Assessment & Plan Note (Addendum)
We discussed options- no intervention, O2 alone orCPAP machine with a medium full face mask We'll proceed with trial of auto C Pap, and see if he gets symptomatic relief. His hemoglobin is currently 14.8-and we can also follow whether his phlebotomy requirements are decreased  Weight loss encouraged, compliance with goal of at least 4-6 hrs every night is the expectation. Advised against medications with sedative side effects Cautioned against driving when sleepy - understanding that sleepiness will vary on a day to day basis

## 2014-12-10 NOTE — Patient Instructions (Signed)
You have mild obstructive sleep apnea We will undertake trial of CPAP machine with a medium full face mask
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
## 2014-12-17 ENCOUNTER — Telehealth: Payer: Self-pay | Admitting: Pulmonary Disease

## 2014-12-17 DIAGNOSIS — G4733 Obstructive sleep apnea (adult) (pediatric): Secondary | ICD-10-CM

## 2014-12-17 NOTE — Telephone Encounter (Signed)
LM for [facility] to ask about order below for mask and CPAP supplies. Pt has yet to receive mask.  Pt aware that [facility] has been contacted.  Referral Notes     Type Date User   General 12/15/2014 10:52 AM COBB, RHONDA J        Note   Confirmation received from Darlina Guys with [facility] that order for New CPAP set up at [address] Carlos Levering [place]          Type Date User   General 12/10/2014 2:06 PM COBB, RHONDA J        Note   Staff message sent to Darlina Guys with [facility] for CPAP Auto 5-15 cm Medium Full Face mask, humidity and send download to Dr. Elsworth Soho in 4 wks. Contact patient on mobile number to arrange. Pt requesting to be set up at [address]. Pt aware of referral. Catha Gosselin

## 2014-12-17 NOTE — Telephone Encounter (Signed)
LM for pt on machine letting him know that [facility] is trying to reach him.

## 2014-12-17 NOTE — Telephone Encounter (Signed)
[name] Moreno returned call [phone]

## 2014-12-18 NOTE — Telephone Encounter (Signed)
lmtcb for Melissa.  Called and spoke to pt. Pt stated he has been trying to reach [facility] but cannot reach anyone and when [facility] calls no voicemail is left for pt.

## 2014-12-23 ENCOUNTER — Encounter: Payer: Self-pay | Admitting: *Deleted

## 2014-12-23 NOTE — Telephone Encounter (Signed)
lmtcb for pt to see if he has reached [facility].

## 2014-12-25 NOTE — Telephone Encounter (Signed)
lmomtcb for pt 

## 2014-12-29 NOTE — Telephone Encounter (Signed)
lmtcb for pt.  

## 2014-12-30 NOTE — Telephone Encounter (Signed)
We can decrease pr to auto 5-10 & trial nasal pillows If he fails that, can refer to dentist for oral appliance

## 2014-12-30 NOTE — Telephone Encounter (Signed)
Patient says that he tried to lower the pressure to 4 and the highest was 11.  He says he would rather have a referral to the dentist to try the oral appliance.

## 2014-12-30 NOTE — Telephone Encounter (Signed)
Pt states that he did receive cpap machine and has tried it for the past 4 nights and is not tolerating it.  He states it is blowing too much air.  He only wore it for 12 minutes last night.  He went online and figured out how to decrease the pressure but this still didn't help.  Pt states that Dr Elsworth Soho mentioned a dental appliance and wants to know about trying this.  Please advise.

## 2014-12-30 NOTE — Telephone Encounter (Signed)
LM for pt to return call regarding [facility] and if they have spoken

## 2015-01-01 ENCOUNTER — Ambulatory Visit (INDEPENDENT_AMBULATORY_CARE_PROVIDER_SITE_OTHER): Payer: Medicare Other | Admitting: Internal Medicine

## 2015-01-01 ENCOUNTER — Encounter: Payer: Self-pay | Admitting: Internal Medicine

## 2015-01-01 VITALS — BP 122/80 | HR 76 | Ht 66.0 in | Wt 190.4 lb

## 2015-01-01 DIAGNOSIS — I5032 Chronic diastolic (congestive) heart failure: Secondary | ICD-10-CM

## 2015-01-01 DIAGNOSIS — Z8679 Personal history of other diseases of the circulatory system: Secondary | ICD-10-CM

## 2015-01-01 DIAGNOSIS — I10 Essential (primary) hypertension: Secondary | ICD-10-CM

## 2015-01-01 DIAGNOSIS — Z95810 Presence of automatic (implantable) cardiac defibrillator: Secondary | ICD-10-CM

## 2015-01-01 DIAGNOSIS — I5022 Chronic systolic (congestive) heart failure: Secondary | ICD-10-CM

## 2015-01-01 LAB — MDC_IDC_ENUM_SESS_TYPE_INCLINIC
Battery Voltage: 3.09 V
Brady Statistic RV Percent Paced: 0.01 %
HIGH POWER IMPEDANCE MEASURED VALUE: 41 Ohm
HighPow Impedance: 19 Ohm
HighPow Impedance: 323 Ohm
HighPow Impedance: 52 Ohm
Lead Channel Impedance Value: 323 Ohm
Lead Channel Sensing Intrinsic Amplitude: 18.375 mV
Lead Channel Setting Pacing Amplitude: 2.5 V
MDC IDC MSMT LEADCHNL RV PACING THRESHOLD AMPLITUDE: 1.25 V
MDC IDC MSMT LEADCHNL RV PACING THRESHOLD PULSEWIDTH: 0.4 ms
MDC IDC SESS DTM: 20160218150947
MDC IDC SET LEADCHNL RV PACING PULSEWIDTH: 0.4 ms
MDC IDC SET LEADCHNL RV SENSING SENSITIVITY: 0.3 mV
MDC IDC SET ZONE DETECTION INTERVAL: 360 ms
Zone Setting Detection Interval: 300 ms
Zone Setting Detection Interval: 350 ms

## 2015-01-01 MED ORDER — FUROSEMIDE 40 MG PO TABS: ORAL_TABLET | ORAL | Status: AC

## 2015-01-01 MED ORDER — MECLIZINE HCL 25 MG PO TABS
25.0000 mg | ORAL_TABLET | Freq: Three times a day (TID) | ORAL | Status: AC | PRN
Start: 2015-01-01 — End: ?

## 2015-01-01 NOTE — Telephone Encounter (Signed)
Refer to dr Oneal Grout -dentist

## 2015-01-01 NOTE — Assessment & Plan Note (Signed)
His symptoms are class 2. He will continue his current meds. I have refilled his lasix prescription. He is encouraged to maintain a low sodium diet.

## 2015-01-01 NOTE — Assessment & Plan Note (Signed)
He has had no recurrent ICD shocks or symptomatic ventricular arrhythmias.

## 2015-01-01 NOTE — Patient Instructions (Addendum)
Your physician wants you to follow-up in: 12 months with Dr Knox Saliva will receive a reminder letter in the mail two months in advance. If you don't receive a letter, please call our office to schedule the follow-up appointment.   Remote monitoring is used to monitor your Pacemaker or ICD from home. This monitoring reduces the number of office visits required to check your device to one time per year. It allows [place] to keep an eye on the functioning of your device to ensure it is working properly. You are scheduled for a device check from home on 04/02/15. You may send your transmission at any time that day. If you have a wireless device, the transmission will be sent automatically. After your physician reviews your transmission, you will receive a postcard with your next transmission date.   Your physician has recommended you make the following change in your medication:  1) Take Furosemide 40mg  as needed for swelling/sob 2) Take Meclizine 25 mg as needed

## 2015-01-01 NOTE — Telephone Encounter (Signed)
Referral entered for Dr. Ron Parker.  Patient is aware of referral.  Nothing further needed.

## 2015-01-01 NOTE — Telephone Encounter (Signed)
RA - please advise if we can place a referral to a dentist for oral appliance. Thanks.

## 2015-01-01 NOTE — Assessment & Plan Note (Signed)
His blood pressure is well controlled. He will continue his current medications. I have asked the patient to take his diuretic and reduce his sodium intake.
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
## 2015-01-06 ENCOUNTER — Encounter (HOSPITAL_BASED_OUTPATIENT_CLINIC_OR_DEPARTMENT_OTHER): Payer: Medicare Other

## 2015-01-14 ENCOUNTER — Emergency Department (HOSPITAL_COMMUNITY)
Admission: EM | Admit: 2015-01-14 | Discharge: 2015-01-14 | Disposition: A | Discharge status: Home / Self Care | Payer: Medicare Other | Attending: Emergency Medicine | Admitting: Emergency Medicine

## 2015-01-14 ENCOUNTER — Encounter (HOSPITAL_COMMUNITY): Payer: Self-pay

## 2015-01-14 DIAGNOSIS — Z86718 Personal history of other venous thrombosis and embolism: Secondary | ICD-10-CM | POA: Diagnosis not present

## 2015-01-14 DIAGNOSIS — Z880 Allergy status to penicillin: Secondary | ICD-10-CM | POA: Insufficient documentation

## 2015-01-14 DIAGNOSIS — Z79899 Other long term (current) drug therapy: Secondary | ICD-10-CM | POA: Diagnosis not present

## 2015-01-14 DIAGNOSIS — Z8619 Personal history of other infectious and parasitic diseases: Secondary | ICD-10-CM | POA: Insufficient documentation

## 2015-01-14 DIAGNOSIS — R42 Dizziness and giddiness: Secondary | ICD-10-CM | POA: Insufficient documentation

## 2015-01-14 DIAGNOSIS — Z8719 Personal history of other diseases of the digestive system: Secondary | ICD-10-CM | POA: Insufficient documentation

## 2015-01-14 DIAGNOSIS — I5030 Unspecified diastolic (congestive) heart failure: Secondary | ICD-10-CM | POA: Diagnosis not present

## 2015-01-14 DIAGNOSIS — I252 Old myocardial infarction: Secondary | ICD-10-CM | POA: Diagnosis not present

## 2015-01-14 DIAGNOSIS — Z8701 Personal history of pneumonia (recurrent): Secondary | ICD-10-CM | POA: Diagnosis not present

## 2015-01-14 DIAGNOSIS — N183 Chronic kidney disease, stage 3 (moderate): Secondary | ICD-10-CM | POA: Diagnosis not present

## 2015-01-14 DIAGNOSIS — Z95810 Presence of automatic (implantable) cardiac defibrillator: Secondary | ICD-10-CM | POA: Diagnosis not present

## 2015-01-14 DIAGNOSIS — Z8639 Personal history of other endocrine, nutritional and metabolic disease: Secondary | ICD-10-CM | POA: Diagnosis not present

## 2015-01-14 DIAGNOSIS — I2510 Atherosclerotic heart disease of native coronary artery without angina pectoris: Secondary | ICD-10-CM | POA: Insufficient documentation

## 2015-01-14 DIAGNOSIS — Z8669 Personal history of other diseases of the nervous system and sense organs: Secondary | ICD-10-CM | POA: Insufficient documentation

## 2015-01-14 DIAGNOSIS — Z7951 Long term (current) use of inhaled steroids: Secondary | ICD-10-CM | POA: Diagnosis not present

## 2015-01-14 DIAGNOSIS — R011 Cardiac murmur, unspecified: Secondary | ICD-10-CM | POA: Insufficient documentation

## 2015-01-14 DIAGNOSIS — Z791 Long term (current) use of non-steroidal anti-inflammatories (NSAID): Secondary | ICD-10-CM | POA: Insufficient documentation

## 2015-01-14 MED ORDER — DIAZEPAM 5 MG PO TABS: 5.0000 mg | ORAL_TABLET | Freq: Four times a day (QID) | ORAL | Status: DC | PRN

## 2015-01-14 MED ORDER — FLUTICASONE PROPIONATE 50 MCG/ACT NA SUSP: 2.0000 | Freq: Every day | NASAL | Status: DC

## 2015-01-14 NOTE — Discharge Instructions (Signed)

## 2015-01-14 NOTE — ED Notes (Addendum)
Pt. Reports for the past 3 days is waking up with dizziness, hx of vertigo.  Has been taking meclizine and states by the end of the day symptoms resolve but when he lays back down the room starts spinning. Denies N/V. Denies weakness, no facial droop, no arm drift. Reports right eye pain. Heart history and has a defibrillator in place.

## 2015-01-14 NOTE — ED Notes (Signed)
Pt made aware to return if symptoms worsen or if any life threatening symptoms occur.   
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
## 2015-01-23 ENCOUNTER — Other Ambulatory Visit: Payer: Self-pay | Admitting: Hematology and Oncology

## 2015-01-23 ENCOUNTER — Ambulatory Visit: Payer: Medicare Other

## 2015-01-23 ENCOUNTER — Other Ambulatory Visit (HOSPITAL_BASED_OUTPATIENT_CLINIC_OR_DEPARTMENT_OTHER): Payer: Medicare Other

## 2015-01-23 ENCOUNTER — Other Ambulatory Visit: Payer: Self-pay

## 2015-01-23 DIAGNOSIS — D751 Secondary polycythemia: Secondary | ICD-10-CM

## 2015-01-23 LAB — CBC & DIFF AND RETIC
BASO%: 0.2 % (ref 0.0–2.0)
Basophils Absolute: 0 10*3/uL (ref 0.0–0.1)
EOS ABS: 0.1 10*3/uL (ref 0.0–0.5)
EOS%: 1.4 % (ref 0.0–7.0)
HCT: 43.8 % (ref 38.4–49.9)
HGB: 15.9 g/dL (ref 13.0–17.1)
IMMATURE RETIC FRACT: 14.3 % — AB (ref 3.00–10.60)
LYMPH#: 1.1 10*3/uL (ref 0.9–3.3)
LYMPH%: 22.6 % (ref 14.0–49.0)
MCH: 36.6 pg — ABNORMAL HIGH (ref 27.2–33.4)
MCHC: 36.3 g/dL — ABNORMAL HIGH (ref 32.0–36.0)
MCV: 100.9 fL — ABNORMAL HIGH (ref 79.3–98.0)
MONO#: 0.4 10*3/uL (ref 0.1–0.9)
MONO%: 8.4 % (ref 0.0–14.0)
NEUT#: 3.4 10*3/uL (ref 1.5–6.5)
NEUT%: 67.4 % (ref 39.0–75.0)
Platelets: 100 10*3/uL — ABNORMAL LOW (ref 140–400)
RBC: 4.34 10*6/uL (ref 4.20–5.82)
RDW: 13.8 % (ref 11.0–14.6)
RETIC %: 2.53 % — AB (ref 0.80–1.80)
RETIC CT ABS: 109.8 10*3/uL — AB (ref 34.80–93.90)
WBC: 5 10*3/uL (ref 4.0–10.3)

## 2015-01-23 NOTE — Progress Notes (Signed)
Hct 43.8 today. Per MD note, phlebotomy if HCT >45. Met patient in lobby to discuss lab results. He denies fatigue, headaches, or changes from normal. Patient aware to return in May and to call [facility] with any new questions or concerns in the meantime. He verbalizes understanding, discharged to home.

## 2015-01-26 LAB — FERRITIN CHCC: FERRITIN: 23 ng/mL (ref 22–316)

## 2015-01-26 LAB — ERYTHROPOIETIN: Erythropoietin: 55.7 m[IU]/mL — ABNORMAL HIGH (ref 2.6–18.5)

## 2015-01-28 ENCOUNTER — Ambulatory Visit: Payer: Medicare Other | Admitting: Pulmonary Disease

## 2015-03-26 ENCOUNTER — Telehealth: Payer: Self-pay | Admitting: Hematology and Oncology

## 2015-03-26 ENCOUNTER — Other Ambulatory Visit: Payer: Self-pay | Admitting: Hematology and Oncology

## 2015-03-26 DIAGNOSIS — D751 Secondary polycythemia: Secondary | ICD-10-CM

## 2015-03-26 NOTE — Telephone Encounter (Signed)
returned call and lvm for pt of when MD first available appt is.

## 2015-03-27 ENCOUNTER — Ambulatory Visit: Payer: MEDICARE

## 2015-03-27 ENCOUNTER — Telehealth: Payer: Self-pay | Admitting: *Deleted

## 2015-03-27 ENCOUNTER — Ambulatory Visit (HOSPITAL_BASED_OUTPATIENT_CLINIC_OR_DEPARTMENT_OTHER): Payer: Medicare Other | Admitting: Hematology and Oncology

## 2015-03-27 ENCOUNTER — Other Ambulatory Visit: Payer: Self-pay | Admitting: Hematology and Oncology

## 2015-03-27 ENCOUNTER — Other Ambulatory Visit: Payer: MEDICARE

## 2015-03-27 ENCOUNTER — Encounter: Payer: Self-pay | Admitting: Hematology and Oncology

## 2015-03-27 ENCOUNTER — Other Ambulatory Visit (HOSPITAL_BASED_OUTPATIENT_CLINIC_OR_DEPARTMENT_OTHER): Payer: Medicare Other

## 2015-03-27 ENCOUNTER — Telehealth: Payer: Self-pay | Admitting: Hematology and Oncology

## 2015-03-27 ENCOUNTER — Ambulatory Visit (HOSPITAL_BASED_OUTPATIENT_CLINIC_OR_DEPARTMENT_OTHER): Payer: Medicare Other

## 2015-03-27 VITALS — BP 140/86 | HR 80 | Temp 98.3°F | Resp 16

## 2015-03-27 VITALS — BP 147/85 | HR 67 | Temp 97.5°F | Resp 18 | Ht 66.0 in | Wt 191.8 lb

## 2015-03-27 DIAGNOSIS — G4733 Obstructive sleep apnea (adult) (pediatric): Secondary | ICD-10-CM

## 2015-03-27 DIAGNOSIS — D751 Secondary polycythemia: Secondary | ICD-10-CM | POA: Diagnosis not present

## 2015-03-27 DIAGNOSIS — D696 Thrombocytopenia, unspecified: Secondary | ICD-10-CM

## 2015-03-27 LAB — CBC & DIFF AND RETIC
BASO%: 0.8 % (ref 0.0–2.0)
Basophils Absolute: 0 10*3/uL (ref 0.0–0.1)
EOS ABS: 0.2 10*3/uL (ref 0.0–0.5)
EOS%: 2.4 % (ref 0.0–7.0)
HEMATOCRIT: 50.2 % — AB (ref 38.4–49.9)
HEMOGLOBIN: 17.3 g/dL — AB (ref 13.0–17.1)
Immature Retic Fract: 9.1 % (ref 3.00–10.60)
LYMPH%: 21.4 % (ref 14.0–49.0)
MCH: 34.8 pg — ABNORMAL HIGH (ref 27.2–33.4)
MCHC: 34.5 g/dL (ref 32.0–36.0)
MCV: 100.8 fL — ABNORMAL HIGH (ref 79.3–98.0)
MONO#: 0.6 10*3/uL (ref 0.1–0.9)
MONO%: 9 % (ref 0.0–14.0)
NEUT#: 4.3 10*3/uL (ref 1.5–6.5)
NEUT%: 66.4 % (ref 39.0–75.0)
Platelets: 119 10*3/uL — ABNORMAL LOW (ref 140–400)
RBC: 4.97 10*6/uL (ref 4.20–5.82)
RDW: 12.6 % (ref 11.0–14.6)
Retic %: 1.69 % (ref 0.80–1.80)
Retic Ct Abs: 83.99 10*3/uL (ref 34.80–93.90)
WBC: 6.5 10*3/uL (ref 4.0–10.3)
lymph#: 1.4 10*3/uL (ref 0.9–3.3)

## 2015-03-27 LAB — MORPHOLOGY
PLT EST: DECREASED
RBC Comments: NORMAL

## 2015-03-27 NOTE — Telephone Encounter (Signed)
Per staff message and POF I have scheduled appts. Advised scheduler of appts. JMW  

## 2015-03-27 NOTE — Telephone Encounter (Signed)
Pt confirmed labs/ov per 05/13 POF, gave pt AVS and Calendar.... KJ, sent msg to add Phlebotomy

## 2015-03-27 NOTE — Progress Notes (Signed)
Phlebotomy performed via right AC over approximately 5 minutes for total volume of 545g.  Patient provided with food and drink and monitored for 30 minutes.  Vital signs stable after procedure.

## 2015-03-27 NOTE — Patient Instructions (Signed)

## 2015-03-28 NOTE — Assessment & Plan Note (Signed)
Cause is unclear, could be due to could be due to hepatic congestion He is not symptomatic Recommend observation only
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
## 2015-03-28 NOTE — Assessment & Plan Note (Signed)
He stated he is not able to tolerate CPAP device I would defer to him to discuss options with pulmonologist

## 2015-03-28 NOTE — Assessment & Plan Note (Signed)
I recommend him to be compliant with OSA management In the mean time, I recommend 1 unit of phlebotomy whenever hematocrit is >48 He is also recommended to take aspirin indefinitely to prevent recurrent thrombosis

## 2015-03-29 ENCOUNTER — Emergency Department (HOSPITAL_COMMUNITY)
Admission: EM | Admit: 2015-03-29 | Discharge: 2015-03-29 | Disposition: A | Discharge status: Home / Self Care | Payer: Medicare Other | Attending: Emergency Medicine | Admitting: Emergency Medicine

## 2015-03-29 ENCOUNTER — Emergency Department (HOSPITAL_COMMUNITY): Payer: Medicare Other

## 2015-03-29 ENCOUNTER — Encounter (HOSPITAL_COMMUNITY): Payer: Self-pay

## 2015-03-29 DIAGNOSIS — Z86718 Personal history of other venous thrombosis and embolism: Secondary | ICD-10-CM | POA: Insufficient documentation

## 2015-03-29 DIAGNOSIS — Z79899 Other long term (current) drug therapy: Secondary | ICD-10-CM | POA: Insufficient documentation

## 2015-03-29 DIAGNOSIS — M25571 Pain in right ankle and joints of right foot: Secondary | ICD-10-CM | POA: Insufficient documentation

## 2015-03-29 DIAGNOSIS — G43909 Migraine, unspecified, not intractable, without status migrainosus: Secondary | ICD-10-CM | POA: Diagnosis not present

## 2015-03-29 DIAGNOSIS — I2510 Atherosclerotic heart disease of native coronary artery without angina pectoris: Secondary | ICD-10-CM | POA: Insufficient documentation

## 2015-03-29 DIAGNOSIS — R011 Cardiac murmur, unspecified: Secondary | ICD-10-CM | POA: Insufficient documentation

## 2015-03-29 DIAGNOSIS — Z8701 Personal history of pneumonia (recurrent): Secondary | ICD-10-CM | POA: Diagnosis not present

## 2015-03-29 DIAGNOSIS — R300 Dysuria: Secondary | ICD-10-CM | POA: Diagnosis present

## 2015-03-29 DIAGNOSIS — N183 Chronic kidney disease, stage 3 (moderate): Secondary | ICD-10-CM | POA: Diagnosis not present

## 2015-03-29 DIAGNOSIS — I5030 Unspecified diastolic (congestive) heart failure: Secondary | ICD-10-CM | POA: Insufficient documentation

## 2015-03-29 DIAGNOSIS — Z8639 Personal history of other endocrine, nutritional and metabolic disease: Secondary | ICD-10-CM | POA: Diagnosis not present

## 2015-03-29 DIAGNOSIS — Z87828 Personal history of other (healed) physical injury and trauma: Secondary | ICD-10-CM | POA: Insufficient documentation

## 2015-03-29 DIAGNOSIS — Z880 Allergy status to penicillin: Secondary | ICD-10-CM | POA: Diagnosis not present

## 2015-03-29 DIAGNOSIS — I252 Old myocardial infarction: Secondary | ICD-10-CM | POA: Diagnosis not present

## 2015-03-29 DIAGNOSIS — Z8619 Personal history of other infectious and parasitic diseases: Secondary | ICD-10-CM | POA: Insufficient documentation

## 2015-03-29 DIAGNOSIS — Z862 Personal history of diseases of the blood and blood-forming organs and certain disorders involving the immune mechanism: Secondary | ICD-10-CM | POA: Diagnosis not present

## 2015-03-29 DIAGNOSIS — Z95810 Presence of automatic (implantable) cardiac defibrillator: Secondary | ICD-10-CM | POA: Diagnosis not present

## 2015-03-29 DIAGNOSIS — Z8719 Personal history of other diseases of the digestive system: Secondary | ICD-10-CM | POA: Insufficient documentation

## 2015-03-29 LAB — URINALYSIS, ROUTINE W REFLEX MICROSCOPIC
Bilirubin Urine: NEGATIVE
GLUCOSE, UA: NEGATIVE mg/dL
Ketones, ur: NEGATIVE mg/dL
LEUKOCYTES UA: NEGATIVE
NITRITE: NEGATIVE
PH: 5.5 (ref 5.0–8.0)
Protein, ur: 100 mg/dL — AB
SPECIFIC GRAVITY, URINE: 1.024 (ref 1.005–1.030)
UROBILINOGEN UA: 0.2 mg/dL (ref 0.0–1.0)

## 2015-03-29 LAB — URINE MICROSCOPIC-ADD ON

## 2015-03-29 NOTE — ED Notes (Signed)
Patient returned from CT

## 2015-03-29 NOTE — ED Notes (Signed)
MD Student at the bedside.

## 2015-03-29 NOTE — Discharge Instructions (Signed)

## 2015-03-29 NOTE — ED Notes (Signed)
Patient reports burning with urination. Patient reports pain in right lateral foot that started three weeks ago. Patient is ambulatory upon admission to the room.

## 2015-03-29 NOTE — ED Notes (Signed)
MD Walden at the bedside. 

## 2015-03-29 NOTE — ED Notes (Signed)
Pt reports he only has one kidney and he has stage 3 kidney disease, and reports today having dysuria, and also reports right leg pain and states that he has had a blood clot in that leg.
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
## 2015-03-30 ENCOUNTER — Telehealth: Payer: Self-pay | Admitting: Internal Medicine

## 2015-03-30 LAB — GC/CHLAMYDIA PROBE AMP (~~LOC~~) NOT AT ARMC
Chlamydia: NEGATIVE
Neisseria Gonorrhea: NEGATIVE

## 2015-03-30 NOTE — Telephone Encounter (Signed)
Ne wMessage   Pt is having difficulty w/ Remote transmission. Pt is aware appt in for 5/19. Please call back and disuss.

## 2015-03-30 NOTE — Telephone Encounter (Signed)
Spoke w/ pt and instructed him to call carelink to order my carelink smart. Pt verbalized understanding and aware that 5-19 remote transmission was cancelled and to keep 6-9 appt w/ MD.

## 2015-04-23 ENCOUNTER — Encounter: Payer: Self-pay | Admitting: Internal Medicine

## 2015-04-23 ENCOUNTER — Ambulatory Visit (INDEPENDENT_AMBULATORY_CARE_PROVIDER_SITE_OTHER): Payer: Medicare Other | Admitting: Internal Medicine

## 2015-04-23 VITALS — BP 146/93 | HR 75 | Ht 66.0 in | Wt 194.4 lb

## 2015-04-23 DIAGNOSIS — I5032 Chronic diastolic (congestive) heart failure: Secondary | ICD-10-CM

## 2015-04-23 DIAGNOSIS — I10 Essential (primary) hypertension: Secondary | ICD-10-CM | POA: Diagnosis not present

## 2015-04-23 DIAGNOSIS — I5022 Chronic systolic (congestive) heart failure: Secondary | ICD-10-CM

## 2015-04-23 DIAGNOSIS — Z95810 Presence of automatic (implantable) cardiac defibrillator: Secondary | ICD-10-CM

## 2015-04-23 DIAGNOSIS — I4901 Ventricular fibrillation: Secondary | ICD-10-CM

## 2015-04-23 LAB — CUP PACEART INCLINIC DEVICE CHECK
Battery Voltage: 3.09 V
Date Time Interrogation Session: 20160609164833
HIGH POWER IMPEDANCE MEASURED VALUE: 19 Ohm
HIGH POWER IMPEDANCE MEASURED VALUE: 41 Ohm
HIGH POWER IMPEDANCE MEASURED VALUE: 51 Ohm
HighPow Impedance: 323 Ohm
Lead Channel Impedance Value: 323 Ohm
Lead Channel Pacing Threshold Amplitude: 1.25 V
Lead Channel Pacing Threshold Pulse Width: 0.4 ms
Lead Channel Setting Pacing Amplitude: 2.25 V
MDC IDC MSMT LEADCHNL RV SENSING INTR AMPL: 23.75 mV
MDC IDC SET LEADCHNL RV PACING PULSEWIDTH: 0.4 ms
MDC IDC SET LEADCHNL RV SENSING SENSITIVITY: 0.3 mV
MDC IDC SET ZONE DETECTION INTERVAL: 300 ms
MDC IDC SET ZONE DETECTION INTERVAL: 360 ms
MDC IDC STAT BRADY RV PERCENT PACED: 0.01 %
Zone Setting Detection Interval: 350 ms

## 2015-04-23 MED ORDER — METOPROLOL SUCCINATE ER 100 MG PO TB24: 100.0000 mg | ORAL_TABLET | Freq: Every day | ORAL | Status: AC

## 2015-04-23 MED ORDER — NITROGLYCERIN 0.4 MG SL SUBL: 0.4000 mg | SUBLINGUAL_TABLET | SUBLINGUAL | Status: DC | PRN

## 2015-04-23 MED ORDER — VERAPAMIL HCL ER 240 MG PO TBCR: 240.0000 mg | EXTENDED_RELEASE_TABLET | Freq: Every day | ORAL | Status: AC

## 2015-04-23 NOTE — Assessment & Plan Note (Signed)
His medtronic device is working normally. Will recheck in several months.  

## 2015-04-23 NOTE — Patient Instructions (Signed)
Medication Instructions:  Your physician recommends that you continue on your current medications as directed. Please refer to the Current Medication list given to you today.   Labwork: None ordered  Testing/Procedures: None ordered  Follow-Up: Your physician wants you to follow-up in: 12 months with Dr Knox Saliva will receive a reminder letter in the mail two months in advance. If you don't receive a letter, please call our office to schedule the follow-up appointment.  Remote monitoring is used to monitor your Pacemaker or ICD from home. This monitoring reduces the number of office visits required to check your device to one time per year. It allows [place] to keep an eye on the functioning of your device to ensure it is working properly. You are scheduled for a device check from home on 07/23/15. You may send your transmission at any time that day. If you have a wireless device, the transmission will be sent automatically. After your physician reviews your transmission, you will receive a postcard with your next transmission date.    Any Other Special Instructions Will Be Listed Below (If Applicable).

## 2015-04-23 NOTE — Assessment & Plan Note (Signed)
His diastolic heart failure remains class 2A. He will continue his current meds, remain active and reduce his sodium intake.
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
## 2015-04-23 NOTE — Assessment & Plan Note (Signed)
His blood pressure is well controlled. He will continue his current meds. 

## 2015-04-25 ENCOUNTER — Encounter (HOSPITAL_COMMUNITY): Payer: Self-pay | Admitting: Emergency Medicine

## 2015-04-25 ENCOUNTER — Emergency Department (HOSPITAL_COMMUNITY)
Admission: EM | Admit: 2015-04-25 | Discharge: 2015-04-25 | Disposition: A | Discharge status: Home / Self Care | Payer: Medicare Other | Attending: Emergency Medicine | Admitting: Emergency Medicine

## 2015-04-25 DIAGNOSIS — Z95810 Presence of automatic (implantable) cardiac defibrillator: Secondary | ICD-10-CM | POA: Diagnosis not present

## 2015-04-25 DIAGNOSIS — K219 Gastro-esophageal reflux disease without esophagitis: Secondary | ICD-10-CM | POA: Insufficient documentation

## 2015-04-25 DIAGNOSIS — R300 Dysuria: Secondary | ICD-10-CM | POA: Insufficient documentation

## 2015-04-25 DIAGNOSIS — N183 Chronic kidney disease, stage 3 (moderate): Secondary | ICD-10-CM | POA: Diagnosis not present

## 2015-04-25 DIAGNOSIS — Z86718 Personal history of other venous thrombosis and embolism: Secondary | ICD-10-CM | POA: Insufficient documentation

## 2015-04-25 DIAGNOSIS — Z862 Personal history of diseases of the blood and blood-forming organs and certain disorders involving the immune mechanism: Secondary | ICD-10-CM | POA: Insufficient documentation

## 2015-04-25 DIAGNOSIS — R319 Hematuria, unspecified: Secondary | ICD-10-CM | POA: Diagnosis not present

## 2015-04-25 DIAGNOSIS — I2510 Atherosclerotic heart disease of native coronary artery without angina pectoris: Secondary | ICD-10-CM | POA: Diagnosis not present

## 2015-04-25 DIAGNOSIS — Z8701 Personal history of pneumonia (recurrent): Secondary | ICD-10-CM | POA: Insufficient documentation

## 2015-04-25 DIAGNOSIS — Z8639 Personal history of other endocrine, nutritional and metabolic disease: Secondary | ICD-10-CM | POA: Insufficient documentation

## 2015-04-25 DIAGNOSIS — I5030 Unspecified diastolic (congestive) heart failure: Secondary | ICD-10-CM | POA: Diagnosis not present

## 2015-04-25 DIAGNOSIS — Z880 Allergy status to penicillin: Secondary | ICD-10-CM | POA: Diagnosis not present

## 2015-04-25 DIAGNOSIS — Z8739 Personal history of other diseases of the musculoskeletal system and connective tissue: Secondary | ICD-10-CM | POA: Diagnosis not present

## 2015-04-25 DIAGNOSIS — Z8619 Personal history of other infectious and parasitic diseases: Secondary | ICD-10-CM | POA: Diagnosis not present

## 2015-04-25 DIAGNOSIS — I252 Old myocardial infarction: Secondary | ICD-10-CM | POA: Diagnosis not present

## 2015-04-25 DIAGNOSIS — Z8709 Personal history of other diseases of the respiratory system: Secondary | ICD-10-CM | POA: Diagnosis not present

## 2015-04-25 DIAGNOSIS — Z79899 Other long term (current) drug therapy: Secondary | ICD-10-CM | POA: Insufficient documentation

## 2015-04-25 DIAGNOSIS — Z8669 Personal history of other diseases of the nervous system and sense organs: Secondary | ICD-10-CM | POA: Diagnosis not present

## 2015-04-25 DIAGNOSIS — Z87828 Personal history of other (healed) physical injury and trauma: Secondary | ICD-10-CM | POA: Diagnosis not present

## 2015-04-25 DIAGNOSIS — Z7982 Long term (current) use of aspirin: Secondary | ICD-10-CM | POA: Insufficient documentation

## 2015-04-25 DIAGNOSIS — R011 Cardiac murmur, unspecified: Secondary | ICD-10-CM | POA: Insufficient documentation

## 2015-04-25 LAB — COMPREHENSIVE METABOLIC PANEL
ALT: 27 U/L (ref 17–63)
AST: 42 U/L — AB (ref 15–41)
Albumin: 3.8 g/dL (ref 3.5–5.0)
Alkaline Phosphatase: 73 U/L (ref 38–126)
Anion gap: 10 (ref 5–15)
BUN: 15 mg/dL (ref 6–20)
CALCIUM: 8.9 mg/dL (ref 8.9–10.3)
CHLORIDE: 98 mmol/L — AB (ref 101–111)
CO2: 23 mmol/L (ref 22–32)
Creatinine, Ser: 1.71 mg/dL — ABNORMAL HIGH (ref 0.61–1.24)
GFR, EST AFRICAN AMERICAN: 51 mL/min — AB (ref >60)
GFR, EST NON AFRICAN AMERICAN: 44 mL/min — AB (ref >60)
GLUCOSE: 118 mg/dL — AB (ref 65–99)
Potassium: 4 mmol/L (ref 3.5–5.1)
Sodium: 131 mmol/L — ABNORMAL LOW (ref 135–145)
Total Bilirubin: 0.4 mg/dL (ref 0.3–1.2)
Total Protein: >12 g/dL — ABNORMAL HIGH (ref 6.5–8.1)

## 2015-04-25 LAB — URINALYSIS, ROUTINE W REFLEX MICROSCOPIC
Bilirubin Urine: NEGATIVE
GLUCOSE, UA: 100 mg/dL — AB
KETONES UR: NEGATIVE mg/dL
Leukocytes, UA: NEGATIVE
Nitrite: NEGATIVE
PH: 5 (ref 5.0–8.0)
Protein, ur: 100 mg/dL — AB
SPECIFIC GRAVITY, URINE: 1.022 (ref 1.005–1.030)
UROBILINOGEN UA: 0.2 mg/dL (ref 0.0–1.0)

## 2015-04-25 LAB — CBC WITH DIFFERENTIAL/PLATELET
BASOS PCT: 0 % (ref 0–1)
Basophils Absolute: 0 10*3/uL (ref 0.0–0.1)
EOS ABS: 0.2 10*3/uL (ref 0.0–0.7)
Eosinophils Relative: 3 % (ref 0–5)
HCT: 44.7 % (ref 39.0–52.0)
Hemoglobin: 16.3 g/dL (ref 13.0–17.0)
LYMPHS ABS: 1.1 10*3/uL (ref 0.7–4.0)
Lymphocytes Relative: 21 % (ref 12–46)
MCH: 35.4 pg — AB (ref 26.0–34.0)
MCHC: 36.5 g/dL — ABNORMAL HIGH (ref 30.0–36.0)
MCV: 97.2 fL (ref 78.0–100.0)
Monocytes Absolute: 0.4 10*3/uL (ref 0.1–1.0)
Monocytes Relative: 6 % (ref 3–12)
NEUTROS PCT: 70 % (ref 43–77)
Neutro Abs: 3.9 10*3/uL (ref 1.7–7.7)
Platelets: 115 10*3/uL — ABNORMAL LOW (ref 150–400)
RBC: 4.6 MIL/uL (ref 4.22–5.81)
RDW: 12.6 % (ref 11.5–15.5)
WBC: 5.5 10*3/uL (ref 4.0–10.5)

## 2015-04-25 LAB — URINE MICROSCOPIC-ADD ON

## 2015-04-25 MED ORDER — CIPROFLOXACIN HCL 500 MG PO TABS: 500.0000 mg | ORAL_TABLET | Freq: Two times a day (BID) | ORAL | Status: DC
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
## 2015-04-25 NOTE — ED Notes (Addendum)
Pt is sexually active.  Sts there is SOME possibility that he has come in to contact with an STI.  Denies any discharge or rash/itching.

## 2015-04-25 NOTE — Discharge Instructions (Signed)
Hematuria Hematuria is blood in your urine. It can be caused by a bladder infection, kidney infection, prostate infection, kidney stone, or cancer of your urinary tract. Infections can usually be treated with medicine, and a kidney stone usually will pass through your urine. If neither of these is the cause of your hematuria, further workup to find out the reason may be needed. It is very important that you tell your health care provider about any blood you see in your urine, even if the blood stops without treatment or happens without causing pain. Blood in your urine that happens and then stops and then happens again can be a symptom of a very serious condition. Also, pain is not a symptom in the initial stages of many urinary cancers. HOME CARE INSTRUCTIONS   Drink lots of fluid, 3-4 quarts a day. If you have been diagnosed with an infection, cranberry juice is especially recommended, in addition to large amounts of water.  Avoid caffeine, tea, and carbonated beverages because they tend to irritate the bladder.  Avoid alcohol because it may irritate the prostate.  Take all medicines as directed by your health care provider.  If you were prescribed an antibiotic medicine, finish it all even if you start to feel better.  If you have been diagnosed with a kidney stone, follow your health care provider's instructions regarding straining your urine to catch the stone.  Empty your bladder often. Avoid holding urine for long periods of time.  After a bowel movement, women should cleanse front to back. Use each tissue only once.  Empty your bladder before and after sexual intercourse if you are a male. SEEK MEDICAL CARE IF:  You develop back pain.  You have a fever.  You have a feeling of sickness in your stomach (nausea) or vomiting.  Your symptoms are not better in 3 days. Return sooner if you are getting worse. SEEK IMMEDIATE MEDICAL CARE IF:   You develop severe vomiting and are  unable to keep the medicine down.  You develop severe back or abdominal pain despite taking your medicines.  You begin passing a large amount of blood or clots in your urine.  You feel extremely weak or faint, or you pass out. MAKE SURE YOU:   Understand these instructions.  Will watch your condition.  Will get help right away if you are not doing well or get worse. Document Released: 10/31/2005 Document Revised: 03/17/2014 Document Reviewed: 07/01/2013 [facility] Patient Information 2015 [place], [place]. This information is not intended to replace advice given to you by your health care provider. Make sure you discuss any questions you have with your health care provider.  Dysuria Dysuria is the medical term for pain with urination. There are many causes for dysuria, but urinary tract infection is the most common. If a urinalysis was performed it can show that there is a urinary tract infection. A urine culture confirms that you or your child is sick. You will need to follow up with a healthcare provider because:  If a urine culture was done you will need to know the culture results and treatment recommendations.  If the urine culture was positive, you or your child will need to be put on antibiotics or know if the antibiotics prescribed are the right antibiotics for your urinary tract infection.  If the urine culture is negative (no urinary tract infection), then other causes may need to be explored or antibiotics need to be stopped. Today laboratory work may have been done  and there does not seem to be an infection. If cultures were done they will take at least 24 to 48 hours to be completed. Today x-rays may have been taken and they read as normal. No cause can be found for the problems. The x-rays may be re-read by a radiologist and you will be contacted if additional findings are made. You or your child may have been put on medications to help with this problem until you can see your  primary caregiver. If the problems get better, see your primary caregiver if the problems return. If you were given antibiotics (medications which kill germs), take all of the mediations as directed for the full course of treatment.  If laboratory work was done, you need to find the results. Leave a telephone number where you can be reached. If this is not possible, make sure you find out how you are to get test results. HOME CARE INSTRUCTIONS   Drink lots of fluids. For adults, drink eight, 8 ounce glasses of clear juice or water a day. For children, replace fluids as suggested by your caregiver.  Empty the bladder often. Avoid holding urine for long periods of time.  After a bowel movement, women should cleanse front to back, using each tissue only once.  Empty your bladder before and after sexual intercourse.  Take all the medicine given to you until it is gone. You may feel better in a few days, but TAKE ALL MEDICINE.  Avoid caffeine, tea, alcohol and carbonated beverages, because they tend to irritate the bladder.  In men, alcohol may irritate the prostate.  Only take over-the-counter or prescription medicines for pain, discomfort, or fever as directed by your caregiver.  If your caregiver has given you a follow-up appointment, it is very important to keep that appointment. Not keeping the appointment could result in a chronic or permanent injury, pain, and disability. If there is any problem keeping the appointment, you must call back to this facility for assistance. SEEK IMMEDIATE MEDICAL CARE IF:   Back pain develops.  A fever develops.  There is nausea (feeling sick to your stomach) or vomiting (throwing up).  Problems are no better with medications or are getting worse. MAKE SURE YOU:   Understand these instructions.  Will watch your condition.  Will get help right away if you are not doing well or get worse. Document Released: 07/29/2004 Document Revised: 01/23/2012  Document Reviewed: 06/05/2008 [facility] Patient Information 2015 [place], [place]. This information is not intended to replace advice given to you by your health care provider. Make sure you discuss any questions you have with your health care provider.

## 2015-04-25 NOTE — ED Notes (Signed)
The patient said he was here for the samne thing a month ago.  The paitent is having the same problem today.  He says he has been having painful urination for three days.  The patient denies blood in his urine, but says the pain has gotten worse.  He rates his pain 8/10.

## 2015-04-27 LAB — GC/CHLAMYDIA PROBE AMP (~~LOC~~) NOT AT ARMC
CHLAMYDIA, DNA PROBE: NEGATIVE
NEISSERIA GONORRHEA: NEGATIVE

## 2015-06-29 ENCOUNTER — Telehealth: Payer: Self-pay | Admitting: Hematology and Oncology

## 2015-06-29 NOTE — Telephone Encounter (Signed)
Patient called in and request to move his appointments as he may be out of town  anne

## 2015-07-09 ENCOUNTER — Encounter: Payer: Self-pay | Admitting: Hematology and Oncology

## 2015-07-09 ENCOUNTER — Ambulatory Visit (HOSPITAL_BASED_OUTPATIENT_CLINIC_OR_DEPARTMENT_OTHER): Payer: Medicare Other | Admitting: Hematology and Oncology

## 2015-07-09 ENCOUNTER — Other Ambulatory Visit (HOSPITAL_BASED_OUTPATIENT_CLINIC_OR_DEPARTMENT_OTHER): Payer: Medicare Other

## 2015-07-09 VITALS — BP 120/72 | HR 64 | Temp 98.1°F | Resp 18 | Ht 66.0 in | Wt 185.3 lb

## 2015-07-09 DIAGNOSIS — N183 Chronic kidney disease, stage 3 unspecified: Secondary | ICD-10-CM

## 2015-07-09 DIAGNOSIS — Z905 Acquired absence of kidney: Secondary | ICD-10-CM

## 2015-07-09 DIAGNOSIS — D751 Secondary polycythemia: Secondary | ICD-10-CM

## 2015-07-09 DIAGNOSIS — R197 Diarrhea, unspecified: Secondary | ICD-10-CM

## 2015-07-09 DIAGNOSIS — D696 Thrombocytopenia, unspecified: Secondary | ICD-10-CM

## 2015-07-09 DIAGNOSIS — L989 Disorder of the skin and subcutaneous tissue, unspecified: Secondary | ICD-10-CM | POA: Insufficient documentation

## 2015-07-09 HISTORY — DX: Diarrhea, unspecified: R19.7

## 2015-07-09 LAB — CBC WITH DIFFERENTIAL/PLATELET
BASO%: 0.7 % (ref 0.0–2.0)
Basophils Absolute: 0 10*3/uL (ref 0.0–0.1)
EOS%: 1.3 % (ref 0.0–7.0)
Eosinophils Absolute: 0.1 10*3/uL (ref 0.0–0.5)
HCT: 46.9 % (ref 38.4–49.9)
HGB: 16.5 g/dL (ref 13.0–17.1)
LYMPH%: 18.9 % (ref 14.0–49.0)
MCH: 36.2 pg — ABNORMAL HIGH (ref 27.2–33.4)
MCHC: 35.1 g/dL (ref 32.0–36.0)
MCV: 103 fL — ABNORMAL HIGH (ref 79.3–98.0)
MONO#: 0.4 10*3/uL (ref 0.1–0.9)
MONO%: 8.3 % (ref 0.0–14.0)
NEUT%: 70.8 % (ref 39.0–75.0)
NEUTROS ABS: 3.7 10*3/uL (ref 1.5–6.5)
Platelets: 113 10*3/uL — ABNORMAL LOW (ref 140–400)
RBC: 4.56 10*6/uL (ref 4.20–5.82)
RDW: 16 % — ABNORMAL HIGH (ref 11.0–14.6)
WBC: 5.2 10*3/uL (ref 4.0–10.3)
lymph#: 1 10*3/uL (ref 0.9–3.3)
nRBC: 0 % (ref 0–0)

## 2015-07-09 NOTE — Assessment & Plan Note (Signed)
He has chronic diarrhea and change in bowel habits for the last few months. I am concerned about the symptoms of tenesmus , anorexia and weight loss. I recommend GI consultation and he agreed to proceed.
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
## 2015-07-09 NOTE — Assessment & Plan Note (Addendum)
The small lesion on the left cheek feels like a simple cyst. I reassured the patient. No further workup is necessary

## 2015-07-09 NOTE — Assessment & Plan Note (Signed)
Cause is unclear, could be due to could be due to hepatic congestion He is not symptomatic Recommend observation only
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
## 2015-07-09 NOTE — Assessment & Plan Note (Signed)
I recommend him to be compliant with OSA management  he does not want to wear the mask In the mean time, I recommend 1 unit of phlebotomy whenever hematocrit is >48 He is also recommended to take aspirin indefinitely to prevent recurrent thrombosis

## 2015-07-14 ENCOUNTER — Telehealth: Payer: Self-pay | Admitting: Hematology and Oncology

## 2015-07-14 NOTE — Telephone Encounter (Signed)
Faxed pt medical records to [facility] °

## 2015-07-20 ENCOUNTER — Emergency Department (HOSPITAL_COMMUNITY)
Admission: EM | Admit: 2015-07-20 | Discharge: 2015-07-20 | Disposition: A | Discharge status: Home / Self Care | Payer: Medicare Other | Attending: Emergency Medicine | Admitting: Emergency Medicine

## 2015-07-20 ENCOUNTER — Encounter (HOSPITAL_COMMUNITY): Payer: Self-pay | Admitting: Emergency Medicine

## 2015-07-20 DIAGNOSIS — I2510 Atherosclerotic heart disease of native coronary artery without angina pectoris: Secondary | ICD-10-CM | POA: Diagnosis not present

## 2015-07-20 DIAGNOSIS — Z8701 Personal history of pneumonia (recurrent): Secondary | ICD-10-CM | POA: Diagnosis not present

## 2015-07-20 DIAGNOSIS — Z202 Contact with and (suspected) exposure to infections with a predominantly sexual mode of transmission: Secondary | ICD-10-CM | POA: Diagnosis not present

## 2015-07-20 DIAGNOSIS — Z7982 Long term (current) use of aspirin: Secondary | ICD-10-CM | POA: Diagnosis not present

## 2015-07-20 DIAGNOSIS — M109 Gout, unspecified: Secondary | ICD-10-CM | POA: Insufficient documentation

## 2015-07-20 DIAGNOSIS — E781 Pure hyperglyceridemia: Secondary | ICD-10-CM | POA: Diagnosis not present

## 2015-07-20 DIAGNOSIS — R300 Dysuria: Secondary | ICD-10-CM | POA: Insufficient documentation

## 2015-07-20 DIAGNOSIS — Z86718 Personal history of other venous thrombosis and embolism: Secondary | ICD-10-CM | POA: Diagnosis not present

## 2015-07-20 DIAGNOSIS — R011 Cardiac murmur, unspecified: Secondary | ICD-10-CM | POA: Diagnosis not present

## 2015-07-20 DIAGNOSIS — Z880 Allergy status to penicillin: Secondary | ICD-10-CM | POA: Insufficient documentation

## 2015-07-20 DIAGNOSIS — I252 Old myocardial infarction: Secondary | ICD-10-CM | POA: Insufficient documentation

## 2015-07-20 DIAGNOSIS — N183 Chronic kidney disease, stage 3 (moderate): Secondary | ICD-10-CM | POA: Insufficient documentation

## 2015-07-20 DIAGNOSIS — Z79899 Other long term (current) drug therapy: Secondary | ICD-10-CM | POA: Insufficient documentation

## 2015-07-20 DIAGNOSIS — Z711 Person with feared health complaint in whom no diagnosis is made: Secondary | ICD-10-CM

## 2015-07-20 DIAGNOSIS — I5030 Unspecified diastolic (congestive) heart failure: Secondary | ICD-10-CM | POA: Insufficient documentation

## 2015-07-20 LAB — URINALYSIS, ROUTINE W REFLEX MICROSCOPIC
Bilirubin Urine: NEGATIVE
GLUCOSE, UA: NEGATIVE mg/dL
KETONES UR: NEGATIVE mg/dL
LEUKOCYTES UA: NEGATIVE
NITRITE: NEGATIVE
PROTEIN: 100 mg/dL — AB
Specific Gravity, Urine: 1.02 (ref 1.005–1.030)
Urobilinogen, UA: 0.2 mg/dL (ref 0.0–1.0)
pH: 5.5 (ref 5.0–8.0)

## 2015-07-20 LAB — URINE MICROSCOPIC-ADD ON

## 2015-07-20 MED ORDER — CEFTRIAXONE SODIUM 250 MG IJ SOLR
250.0000 mg | Freq: Once | INTRAMUSCULAR | Status: AC
  Administered 2015-07-20: 250 mg via INTRAMUSCULAR
  Filled 2015-07-20: qty 250

## 2015-07-20 MED ORDER — PHENAZOPYRIDINE HCL 100 MG PO TABS
95.0000 mg | ORAL_TABLET | Freq: Once | ORAL | Status: AC
  Administered 2015-07-20: 100 mg via ORAL
  Filled 2015-07-20: qty 1

## 2015-07-20 MED ORDER — STERILE WATER FOR INJECTION IJ SOLN
INTRAMUSCULAR | Status: AC
  Administered 2015-07-20: 10 mL
  Filled 2015-07-20: qty 10

## 2015-07-20 MED ORDER — AZITHROMYCIN 250 MG PO TABS
1000.0000 mg | ORAL_TABLET | Freq: Once | ORAL | Status: AC
  Administered 2015-07-20: 1000 mg via ORAL
  Filled 2015-07-20: qty 4
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
## 2015-07-20 NOTE — Discharge Instructions (Signed)
Dysuria Follow-up with urology for dysuria. Dysuria is the medical term for pain with urination. There are many causes for dysuria, but urinary tract infection is the most common. If a urinalysis was performed it can show that there is a urinary tract infection. A urine culture confirms that you or your child is sick. You will need to follow up with a healthcare provider because:  If a urine culture was done you will need to know the culture results and treatment recommendations.  If the urine culture was positive, you or your child will need to be put on antibiotics or know if the antibiotics prescribed are the right antibiotics for your urinary tract infection.  If the urine culture is negative (no urinary tract infection), then other causes may need to be explored or antibiotics need to be stopped. Today laboratory work may have been done and there does not seem to be an infection. If cultures were done they will take at least 24 to 48 hours to be completed. Today x-rays may have been taken and they read as normal. No cause can be found for the problems. The x-rays may be re-read by a radiologist and you will be contacted if additional findings are made. You or your child may have been put on medications to help with this problem until you can see your primary caregiver. If the problems get better, see your primary caregiver if the problems return. If you were given antibiotics (medications which kill germs), take all of the mediations as directed for the full course of treatment.  If laboratory work was done, you need to find the results. Leave a telephone number where you can be reached. If this is not possible, make sure you find out how you are to get test results. HOME CARE INSTRUCTIONS   Drink lots of fluids. For adults, drink eight, 8 ounce glasses of clear juice or water a day. For children, replace fluids as suggested by your caregiver.  Empty the bladder often. Avoid holding urine for  long periods of time.  After a bowel movement, women should cleanse front to back, using each tissue only once.  Empty your bladder before and after sexual intercourse.  Take all the medicine given to you until it is gone. You may feel better in a few days, but TAKE ALL MEDICINE.  Avoid caffeine, tea, alcohol and carbonated beverages, because they tend to irritate the bladder.  In men, alcohol may irritate the prostate.  Only take over-the-counter or prescription medicines for pain, discomfort, or fever as directed by your caregiver.  If your caregiver has given you a follow-up appointment, it is very important to keep that appointment. Not keeping the appointment could result in a chronic or permanent injury, pain, and disability. If there is any problem keeping the appointment, you must call back to this facility for assistance. SEEK IMMEDIATE MEDICAL CARE IF:   Back pain develops.  A fever develops.  There is nausea (feeling sick to your stomach) or vomiting (throwing up).  Problems are no better with medications or are getting worse. MAKE SURE YOU:   Understand these instructions.  Will watch your condition.  Will get help right away if you are not doing well or get worse. Document Released: 07/29/2004 Document Revised: 01/23/2012 Document Reviewed: 06/05/2008 [facility] Patient Information 2015 [place], [place]. This information is not intended to replace advice given to you by your health care provider. Make sure you discuss any questions you have with your health care  provider.

## 2015-07-20 NOTE — ED Notes (Signed)
Pt. reports dysuria onset yesterday , denies hematuria or penile discharge , no fever or chills.

## 2015-07-21 LAB — GC/CHLAMYDIA PROBE AMP (~~LOC~~) NOT AT ARMC
Chlamydia: NEGATIVE
Neisseria Gonorrhea: NEGATIVE

## 2015-07-24 ENCOUNTER — Ambulatory Visit: Payer: Medicare Other | Admitting: Hematology and Oncology

## 2015-07-24 ENCOUNTER — Other Ambulatory Visit: Payer: Medicare Other

## 2015-08-27 ENCOUNTER — Ambulatory Visit (INDEPENDENT_AMBULATORY_CARE_PROVIDER_SITE_OTHER): Payer: Medicare Other | Admitting: *Deleted

## 2015-08-27 DIAGNOSIS — Z8679 Personal history of other diseases of the circulatory system: Secondary | ICD-10-CM | POA: Diagnosis not present

## 2015-08-27 DIAGNOSIS — Z8674 Personal history of sudden cardiac arrest: Secondary | ICD-10-CM | POA: Diagnosis not present

## 2015-08-27 DIAGNOSIS — I5022 Chronic systolic (congestive) heart failure: Secondary | ICD-10-CM | POA: Diagnosis not present

## 2015-08-27 DIAGNOSIS — I5032 Chronic diastolic (congestive) heart failure: Secondary | ICD-10-CM | POA: Diagnosis not present

## 2015-08-27 DIAGNOSIS — Z23 Encounter for immunization: Secondary | ICD-10-CM | POA: Diagnosis not present

## 2015-08-28 LAB — CUP PACEART INCLINIC DEVICE CHECK
Battery Voltage: 3.05 V
Brady Statistic RV Percent Paced: 0.01 %
HighPow Impedance: 323 Ohm
HighPow Impedance: 40 Ohm
HighPow Impedance: 52 Ohm
Implantable Lead Implant Date: 20120711
Implantable Lead Location: 753860
Implantable Lead Model: 184
Implantable Lead Serial Number: 324224
Lead Channel Impedance Value: 323 Ohm
Lead Channel Pacing Threshold Amplitude: 1.25 V
Lead Channel Setting Pacing Amplitude: 2.5 V
Lead Channel Setting Sensing Sensitivity: 0.3 mV
MDC IDC MSMT LEADCHNL RV PACING THRESHOLD PULSEWIDTH: 0.4 ms
MDC IDC MSMT LEADCHNL RV SENSING INTR AMPL: 14 mV
MDC IDC MSMT LEADCHNL RV SENSING INTR AMPL: 15.5 mV
MDC IDC SESS DTM: 20161013140231
MDC IDC SET LEADCHNL RV PACING PULSEWIDTH: 0.4 ms
MDC IDC SET ZONE DETECTION INTERVAL: 300 ms
Zone Setting Detection Interval: 350 ms

## 2015-08-28 NOTE — Progress Notes (Signed)
ICD check in clinic. Normal device function. Threshold and sensing consistent with previous device measurements. Impedance trends stable over time. No evidence of any ventricular arrhythmias. Histogram distribution appropriate for patient and level of activity. Stable thoracic impedance. No changes made this session. Device programmed at appropriate safety margins. Device programmed to optimize intrinsic conduction. Batt voltage 3.05V (ERI 2.63V). Patient will follow up via [facility] on 1/12 and with [facility] in 04-2016. Patient education completed including shock plan. Alert tones demonstrated for patient.

## 2015-09-16 ENCOUNTER — Encounter: Payer: Self-pay | Admitting: Internal Medicine

## 2015-09-28 ENCOUNTER — Emergency Department (HOSPITAL_COMMUNITY)
Admission: EM | Admit: 2015-09-28 | Discharge: 2015-09-28 | Disposition: A | Discharge status: Home / Self Care | Payer: Medicare Other | Attending: Emergency Medicine | Admitting: Emergency Medicine

## 2015-09-28 ENCOUNTER — Encounter (HOSPITAL_COMMUNITY): Payer: Self-pay | Admitting: Emergency Medicine

## 2015-09-28 DIAGNOSIS — I252 Old myocardial infarction: Secondary | ICD-10-CM | POA: Insufficient documentation

## 2015-09-28 DIAGNOSIS — Z87828 Personal history of other (healed) physical injury and trauma: Secondary | ICD-10-CM | POA: Diagnosis not present

## 2015-09-28 DIAGNOSIS — Z8701 Personal history of pneumonia (recurrent): Secondary | ICD-10-CM | POA: Insufficient documentation

## 2015-09-28 DIAGNOSIS — I5030 Unspecified diastolic (congestive) heart failure: Secondary | ICD-10-CM | POA: Diagnosis not present

## 2015-09-28 DIAGNOSIS — Z7982 Long term (current) use of aspirin: Secondary | ICD-10-CM | POA: Insufficient documentation

## 2015-09-28 DIAGNOSIS — Z8619 Personal history of other infectious and parasitic diseases: Secondary | ICD-10-CM | POA: Diagnosis not present

## 2015-09-28 DIAGNOSIS — I2510 Atherosclerotic heart disease of native coronary artery without angina pectoris: Secondary | ICD-10-CM | POA: Diagnosis not present

## 2015-09-28 DIAGNOSIS — K002 Abnormalities of size and form of teeth: Secondary | ICD-10-CM | POA: Diagnosis not present

## 2015-09-28 DIAGNOSIS — G43909 Migraine, unspecified, not intractable, without status migrainosus: Secondary | ICD-10-CM | POA: Insufficient documentation

## 2015-09-28 DIAGNOSIS — R011 Cardiac murmur, unspecified: Secondary | ICD-10-CM | POA: Insufficient documentation

## 2015-09-28 DIAGNOSIS — K219 Gastro-esophageal reflux disease without esophagitis: Secondary | ICD-10-CM | POA: Diagnosis not present

## 2015-09-28 DIAGNOSIS — N183 Chronic kidney disease, stage 3 (moderate): Secondary | ICD-10-CM | POA: Insufficient documentation

## 2015-09-28 DIAGNOSIS — Z79899 Other long term (current) drug therapy: Secondary | ICD-10-CM | POA: Insufficient documentation

## 2015-09-28 DIAGNOSIS — Z95810 Presence of automatic (implantable) cardiac defibrillator: Secondary | ICD-10-CM | POA: Diagnosis not present

## 2015-09-28 DIAGNOSIS — J029 Acute pharyngitis, unspecified: Secondary | ICD-10-CM | POA: Diagnosis not present

## 2015-09-28 DIAGNOSIS — K0889 Other specified disorders of teeth and supporting structures: Secondary | ICD-10-CM | POA: Diagnosis not present

## 2015-09-28 DIAGNOSIS — H9202 Otalgia, left ear: Secondary | ICD-10-CM | POA: Diagnosis not present

## 2015-09-28 DIAGNOSIS — Z862 Personal history of diseases of the blood and blood-forming organs and certain disorders involving the immune mechanism: Secondary | ICD-10-CM | POA: Diagnosis not present

## 2015-09-28 DIAGNOSIS — Z8639 Personal history of other endocrine, nutritional and metabolic disease: Secondary | ICD-10-CM | POA: Insufficient documentation

## 2015-09-28 DIAGNOSIS — Z86718 Personal history of other venous thrombosis and embolism: Secondary | ICD-10-CM | POA: Diagnosis not present

## 2015-09-28 DIAGNOSIS — Z880 Allergy status to penicillin: Secondary | ICD-10-CM | POA: Insufficient documentation

## 2015-09-28 DIAGNOSIS — Z8709 Personal history of other diseases of the respiratory system: Secondary | ICD-10-CM | POA: Insufficient documentation

## 2015-09-28 DIAGNOSIS — H5712 Ocular pain, left eye: Secondary | ICD-10-CM | POA: Insufficient documentation

## 2015-09-28 MED ORDER — CLINDAMYCIN HCL 300 MG PO CAPS: 300.0000 mg | ORAL_CAPSULE | Freq: Four times a day (QID) | ORAL | Status: DC

## 2015-09-28 MED ORDER — NAPROXEN 500 MG PO TABS: 500.0000 mg | ORAL_TABLET | Freq: Two times a day (BID) | ORAL | Status: DC
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
## 2015-09-28 NOTE — ED Notes (Signed)
Pt. Stated, i started having left side face pain and I think its because of some dental pain.

## 2015-09-28 NOTE — Discharge Instructions (Signed)
Dental Pain Dental pain may be caused by many things, including:  Tooth decay (cavities or caries). Cavities cause the nerve of your tooth to be open to air and hot or cold temperatures. This can cause pain or discomfort.  Abscess or infection. A dental abscess is an area that is full of infected pus from a bacterial infection in the inner part of the tooth (pulp). It usually happens at the end of the tooth's root.  Injury.  An unknown reason (idiopathic). Your pain may be mild or severe. It may only happen when:  You are chewing.  You are exposed to hot or cold temperature.  You are eating or drinking sugary foods or beverages, such as:  Soda.  Candy. Your pain may also be there all of the time. HOME CARE Watch your dental pain for any changes. Do these things to lessen your discomfort:  Take medicines only as told by your dentist.  If your dentist tells you to take an antibiotic medicine, finish all of it even if you start to feel better.  Keep all follow-up visits as told by your dentist. This is important.  Do not apply heat to the outside of your face.  Rinse your mouth or gargle with salt water if told by your dentist. This helps with pain and swelling.  You can make salt water by adding  tsp of salt to 1 cup of warm water.  Apply ice to the painful area of your face:  Put ice in a plastic bag.  Place a towel between your skin and the bag.  Leave the ice on for 20 minutes, 2-3 times per day.  Avoid foods or drinks that cause you pain, such as:  Very hot or very cold foods or drinks.  Sweet or sugary foods or drinks. GET HELP IF:  Your pain is not helped with medicines.  Your symptoms are worse.  You have new symptoms. GET HELP RIGHT AWAY IF:  You cannot open your mouth.  You are having trouble breathing or swallowing.  You have a fever.  Your face, neck, or jaw is puffy (swollen).   This information is not intended to replace advice given to  you by your health care provider. Make sure you discuss any questions you have with your health care provider.   Document Released: 04/18/2008 Document Revised: 03/17/2015 Document Reviewed: 10/27/2014 Elsevier Interactive Patient Education [employer].

## 2015-10-01 ENCOUNTER — Other Ambulatory Visit (HOSPITAL_BASED_OUTPATIENT_CLINIC_OR_DEPARTMENT_OTHER): Payer: Medicare Other

## 2015-10-01 DIAGNOSIS — D751 Secondary polycythemia: Secondary | ICD-10-CM | POA: Diagnosis not present

## 2015-10-01 LAB — CBC & DIFF AND RETIC
BASO%: 0.6 % (ref 0.0–2.0)
Basophils Absolute: 0 10*3/uL (ref 0.0–0.1)
EOS ABS: 0.1 10*3/uL (ref 0.0–0.5)
EOS%: 1.8 % (ref 0.0–7.0)
HCT: 36.8 % — ABNORMAL LOW (ref 38.4–49.9)
HGB: 12.9 g/dL — ABNORMAL LOW (ref 13.0–17.1)
Immature Retic Fract: 11.8 % — ABNORMAL HIGH (ref 3.00–10.60)
LYMPH#: 0.8 10*3/uL — AB (ref 0.9–3.3)
LYMPH%: 20.5 % (ref 14.0–49.0)
MCH: 38.1 pg — ABNORMAL HIGH (ref 27.2–33.4)
MCHC: 35.1 g/dL (ref 32.0–36.0)
MCV: 108.5 fL — ABNORMAL HIGH (ref 79.3–98.0)
MONO#: 0.3 10*3/uL (ref 0.1–0.9)
MONO%: 6.7 % (ref 0.0–14.0)
NEUT%: 70.4 % (ref 39.0–75.0)
NEUTROS ABS: 2.7 10*3/uL (ref 1.5–6.5)
PLATELETS: 105 10*3/uL — AB (ref 140–400)
RBC: 3.4 10*6/uL — ABNORMAL LOW (ref 4.20–5.82)
RDW: 13.8 % (ref 11.0–14.6)
Retic %: 2.31 % — ABNORMAL HIGH (ref 0.80–1.80)
Retic Ct Abs: 78.54 10*3/uL (ref 34.80–93.90)
WBC: 3.8 10*3/uL — AB (ref 4.0–10.3)
nRBC: 0 % (ref 0–0)

## 2015-10-13 ENCOUNTER — Encounter (HOSPITAL_COMMUNITY): Payer: Self-pay | Admitting: Emergency Medicine

## 2015-10-13 ENCOUNTER — Emergency Department (HOSPITAL_COMMUNITY)
Admission: EM | Admit: 2015-10-13 | Discharge: 2015-10-13 | Disposition: A | Discharge status: Home / Self Care | Payer: Medicare Other | Attending: Emergency Medicine | Admitting: Emergency Medicine

## 2015-10-13 DIAGNOSIS — Z880 Allergy status to penicillin: Secondary | ICD-10-CM | POA: Diagnosis not present

## 2015-10-13 DIAGNOSIS — R011 Cardiac murmur, unspecified: Secondary | ICD-10-CM | POA: Insufficient documentation

## 2015-10-13 DIAGNOSIS — K0889 Other specified disorders of teeth and supporting structures: Secondary | ICD-10-CM | POA: Diagnosis present

## 2015-10-13 DIAGNOSIS — I5030 Unspecified diastolic (congestive) heart failure: Secondary | ICD-10-CM | POA: Insufficient documentation

## 2015-10-13 DIAGNOSIS — K219 Gastro-esophageal reflux disease without esophagitis: Secondary | ICD-10-CM | POA: Insufficient documentation

## 2015-10-13 DIAGNOSIS — Z86718 Personal history of other venous thrombosis and embolism: Secondary | ICD-10-CM | POA: Diagnosis not present

## 2015-10-13 DIAGNOSIS — Z7982 Long term (current) use of aspirin: Secondary | ICD-10-CM | POA: Diagnosis not present

## 2015-10-13 DIAGNOSIS — N183 Chronic kidney disease, stage 3 (moderate): Secondary | ICD-10-CM | POA: Insufficient documentation

## 2015-10-13 DIAGNOSIS — I2510 Atherosclerotic heart disease of native coronary artery without angina pectoris: Secondary | ICD-10-CM | POA: Diagnosis not present

## 2015-10-13 DIAGNOSIS — Z8701 Personal history of pneumonia (recurrent): Secondary | ICD-10-CM | POA: Diagnosis not present

## 2015-10-13 DIAGNOSIS — I252 Old myocardial infarction: Secondary | ICD-10-CM | POA: Insufficient documentation

## 2015-10-13 DIAGNOSIS — Z79899 Other long term (current) drug therapy: Secondary | ICD-10-CM | POA: Diagnosis not present

## 2015-10-13 DIAGNOSIS — J069 Acute upper respiratory infection, unspecified: Secondary | ICD-10-CM | POA: Diagnosis not present

## 2015-10-13 LAB — RAPID STREP SCREEN (MED CTR MEBANE ONLY): Streptococcus, Group A Screen (Direct): NEGATIVE

## 2015-10-13 MED ORDER — BENZONATATE 100 MG PO CAPS: 100.0000 mg | ORAL_CAPSULE | Freq: Three times a day (TID) | ORAL | Status: DC

## 2015-10-13 MED ORDER — CLINDAMYCIN HCL 300 MG PO CAPS: 300.0000 mg | ORAL_CAPSULE | Freq: Four times a day (QID) | ORAL | Status: DC

## 2015-10-13 NOTE — ED Notes (Signed)
Pt. presents with multiple complaints : dry cough onset yesterday , left earache , left lower molar pain , headache and sore throat onset yesterday , denies fever or chills.
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
## 2015-10-13 NOTE — Discharge Instructions (Signed)
Schedule a follow-up appointment with your dentist as soon as possible.   Upper Respiratory Infection, Adult Most upper respiratory infections (URIs) are a viral infection of the air passages leading to the lungs. A URI affects the nose, throat, and upper air passages. The most common type of URI is nasopharyngitis and is typically referred to as "the common cold." URIs run their course and usually go away on their own. Most of the time, a URI does not require medical attention, but sometimes a bacterial infection in the upper airways can follow a viral infection. This is called a secondary infection. Sinus and middle ear infections are common types of secondary upper respiratory infections. Bacterial pneumonia can also complicate a URI. A URI can worsen asthma and chronic obstructive pulmonary disease (COPD). Sometimes, these complications can require emergency medical care and may be life threatening.  CAUSES Almost all URIs are caused by viruses. A virus is a type of germ and can spread from one person to another.  RISKS FACTORS You may be at risk for a URI if:   You smoke.   You have chronic heart or lung disease.  You have a weakened defense (immune) system.   You are very young or very old.   You have nasal allergies or asthma.  You work in crowded or poorly ventilated areas.  You work in health care facilities or schools. SIGNS AND SYMPTOMS  Symptoms typically develop 2-3 days after you come in contact with a cold virus. Most viral URIs last 7-10 days. However, viral URIs from the influenza virus (flu virus) can last 14-18 days and are typically more severe. Symptoms may include:   Runny or stuffy (congested) nose.   Sneezing.   Cough.   Sore throat.   Headache.   Fatigue.   Fever.   Loss of appetite.   Pain in your forehead, behind your eyes, and over your cheekbones (sinus pain).  Muscle aches.  DIAGNOSIS  Your health care provider may diagnose a  URI by:  Physical exam.  Tests to check that your symptoms are not due to another condition such as:  Strep throat.  Sinusitis.  Pneumonia.  Asthma. TREATMENT  A URI goes away on its own with time. It cannot be cured with medicines, but medicines may be prescribed or recommended to relieve symptoms. Medicines may help:  Reduce your fever.  Reduce your cough.  Relieve nasal congestion. HOME CARE INSTRUCTIONS   Take medicines only as directed by your health care provider.   Gargle warm saltwater or take cough drops to comfort your throat as directed by your health care provider.  Use a warm mist humidifier or inhale steam from a shower to increase air moisture. This may make it easier to breathe.  Drink enough fluid to keep your urine clear or pale yellow.   Eat soups and other clear broths and maintain good nutrition.   Rest as needed.   Return to work when your temperature has returned to normal or as your health care provider advises. You may need to stay home longer to avoid infecting others. You can also use a face mask and careful hand washing to prevent spread of the virus.  Increase the usage of your inhaler if you have asthma.   Do not use any tobacco products, including cigarettes, chewing tobacco, or electronic cigarettes. If you need help quitting, ask your health care provider. PREVENTION  The best way to protect yourself from getting a cold is to practice  good hygiene.   Avoid oral or hand contact with people with cold symptoms.   Wash your hands often if contact occurs.  There is no clear evidence that vitamin C, vitamin E, echinacea, or exercise reduces the chance of developing a cold. However, it is always recommended to get plenty of rest, exercise, and practice good nutrition.  SEEK MEDICAL CARE IF:   You are getting worse rather than better.   Your symptoms are not controlled by medicine.   You have chills.  You have worsening  shortness of breath.  You have brown or red mucus.  You have yellow or brown nasal discharge.  You have pain in your face, especially when you bend forward.  You have a fever.  You have swollen neck glands.  You have pain while swallowing.  You have white areas in the back of your throat. SEEK IMMEDIATE MEDICAL CARE IF:   You have severe or persistent:  Headache.  Ear pain.  Sinus pain.  Chest pain.  You have chronic lung disease and any of the following:  Wheezing.  Prolonged cough.  Coughing up blood.  A change in your usual mucus.  You have a stiff neck.  You have changes in your:  Vision.  Hearing.  Thinking.  Mood. MAKE SURE YOU:   Understand these instructions.  Will watch your condition.  Will get help right away if you are not doing well or get worse.   This information is not intended to replace advice given to you by your health care provider. Make sure you discuss any questions you have with your health care provider.   Document Released: 04/26/2001 Document Revised: 03/17/2015 Document Reviewed: 02/05/2014 Elsevier Interactive Patient Education 2016 [employer].   Emergency Department Resource Guide 1) Find a Doctor and Pay Out of Pocket Although you won't have to find out who is covered by your insurance plan, it is a good idea to ask around and get recommendations. You will then need to call the office and see if the doctor you have chosen will accept you as a new patient and what types of options they offer for patients who are self-pay. Some doctors offer discounts or will set up payment plans for their patients who do not have insurance, but you will need to ask so you aren't surprised when you get to your appointment.  2) Contact Your Local Health Department Not all health departments have doctors that can see patients for sick visits, but many do, so it is worth a call to see if yours does. If you don't know where your local  health department is, you can check in your phone book. The CDC also has a tool to help you locate your state's health department, and many state websites also have listings of all of their local health departments.  3) Find a [facility] If your illness is not likely to be very severe or complicated, you may want to try a walk in clinic. These are popping up all over the country in pharmacies, drugstores, and shopping centers. They're usually staffed by nurse practitioners or physician assistants that have been trained to treat common illnesses and complaints. They're usually fairly quick and inexpensive. However, if you have serious medical issues or chronic medical problems, these are probably not your best option.  No Primary Care Doctor: - Call Health Connect at  [phone] - they can help you locate a primary care doctor that  accepts your insurance, provides certain services,  etc. - Physician Referral Service- [phone]  Chronic Pain Problems: Organization         Address  Phone   Notes  [facility]  [phone] Patients need to be referred by their primary care doctor.   Medication Assistance: Organization         Address  Phone   Notes  [facility] [address] [place], [place] [address] [phone] --Must be a resident of [facility] -- Must have NO insurance coverage whatsoever (no Medicaid/ Medicare, etc.) -- The pt. MUST have a primary care doctor that directs their care regularly and follows them in the community   [facility]  [phone]   [employer]  [phone]    Agencies that provide inexpensive medical care: Organization         Address  Phone   Notes  [place]  [phone]   [facility]    [phone]   [facility] [place], [place] [address] [phone]   [place] [address] [place] [phone]   Planned Parenthood    [phone]   [facility]    [phone]   [place] and [address], [place] Phone:  [phone], Fax:  [phone] Hours of Operation:  9 am - 6 pm, M-F.  Also accepts Medicaid/Medicare and self-pay.  [facility] for [place] [address], [address], [place] Phone: [phone], Fax: [phone]. Hours of Operation:  8:30 am - 5:30 pm, M-F.  Also accepts Medicaid and self-pay.  [facility] [place] [address] [place] Phone: [phone]   [place], [place], [place] [phone], Ext. [phone] Mondays & Thursdays: 7-9 AM.  First 15 patients are seen on a first come, first serve basis.    [place] Providers:  Organization         Address  Phone   Notes  [facility] [address], [place] [phone] Also accepts self-pay patients.  [facility] [id number] [place], [place]  [phone]   [place], [address], [place] [phone]   [facility] [address] [place] [phone]   Lucianne Lei [address], [place]   [phone] Only accepts [place] Access [place] patients after they have their name applied to their card.   Self-Pay (no insurance) in [facility]:  Organization         Address  Phone   Notes  Sickle Cell Patients, [facility] [place] [phone]   [facility] [place] [phone]   [facility]  [place], [place], [place] [phone]   [facility]/Dr. Osei-Bonsu  [address], [place] or [address], [place] [phone] Phone number for both [place] and  [place] locations is the same.  Urgent [facility] and [facility] [address], [place] [phone]   [facility] [address], [place] or [address] [phone]   [facility] [place], [employer] (  [phone], phone; [phone], fax Sees patients 1st and 3rd Saturday of every month.  Must not qualify for public or private insurance (i.e. Medicaid, Medicare, [place] Health Choice, Veterans' Benefits)  Household income should be no more than 200% of the poverty level The clinic cannot treat you if you are pregnant or think you are pregnant  Sexually transmitted diseases are not treated at the clinic.    Dental Care: Organization         Address  Phone  Notes  [facility] [place] [phone] Accepts children up to age 83 who are enrolled in [place] or [place]; pregnant women with a Medicaid card; and children who have applied for Medicaid or [place] Health Choice, but were declined, whose parents can pay a reduced fee at time of service.  [facility] of [facility]  [address], [place] [phone] Accepts children up to age 79 who are enrolled in [place] or [place]; pregnant women with a Medicaid card; and children who have applied for Medicaid or [place] Health Choice, but were declined, whose parents can pay a reduced fee at time of service.  [place] Adult Dental Access PROGRAM  [place] [phone] Patients are seen by appointment only. Walk-ins are not accepted. [place] will see patients 52 years of age and older. Monday - Tuesday (8am-5pm) Most Wednesdays (8:30-5pm) $30 per visit, cash only  [facility] Adult [facility] Access PRO[facility]  [address], [facility] [phone] Patients are seen by appointment only. Walk-ins are not accepted.  [place] will see patients 29 years of age and older. One Wednesday Evening (Monthly: Volunteer Based).  $30 per visit, cash only  [place]  [phone] for adults; Children under age 92, call Graduate Pediatric Dentistry at [phone]. Children aged 14-14, please call [phone] to request a pediatric application.  Dental services are provided in all areas of dental care including fillings, crowns and bridges, complete and partial dentures, implants, gum treatment, root canals, and extractions. Preventive care is also provided. Treatment is provided to both adults and children. Patients are selected via a lottery and there is often a waiting list.   [facility] [address] [place]  [phone] [email or web address]   [facility] [address] [place], [place] [phone], Ext. [phone] Second and Fourth Thursday of each month, opens at 6:30 AM; Clinic ends at 9 AM.  Patients are seen on a first-come first-served basis, and a limited number are seen during each clinic.   [facility]  [address] Hillard Danker [place], [place] [phone]   Eligibility Requirements You must have lived in [place], [place], or [place] counties for at least the last three months.   You cannot be eligible for state or federal sponsored [employer], including [employer], [place], or [employer].   You generally cannot be eligible for healthcare insurance through your employer.    How to apply: Eligibility screenings are held every Tuesday and Wednesday afternoon from 1:00 pm until 4:00 pm. You do not need an appointment for the interview!  [facility] [address] [place], [place]   [place]  [place]  [facility]  [phone]    Behavioral Health Resources  in the  Community: Intensive Outpatient Programs Organization         Address  Phone  Notes  [facility] [place]. [address] [place], [place] [phone]   [facility] Outpatient [address], [place], [place]   ADS: Alcohol & Drug Svcs [address], [place], [place]   [facility] [address],  [place], [place] or [phone]   Substance Abuse Resources Organization         Address  Phone  Notes  Alcohol and Drug Services  [phone]   [place]  [phone]   [facility]   Chinita Pester  [phone]   Residential & Outpatient Substance Abuse Program  [phone]   Psychological Services Organization         Address  Phone  Notes  [facility] [place]  [place]  [phone]   [place] [address] [place] or [phone]    Mobile Crisis Teams Organization         Address  Phone  Notes  [facility], Mobile Crisis Care Unit  [phone]   [facility]  [address] [place], [place]   [place] [address] [place] [phone]    Self-Help/Support Groups Organization         Address  Phone             Notes  [place]. of [place] - variety of support groups  [place] Call for more information  Narcotics Anonymous (NA), [facility] [address], [employer] [place]  2 meetings at this location   Special educational needs teacher         Address  Phone  Notes  [facility] Residential Treatment [place],    [place]  [phone]   [facility]  [address] [place] [id number], [place], [place]   [place], [place] [phone] Admissions: 8am-3pm M-F  Incentives Substance [place] [address],    [place], [place] [phone]   [facility] [address] [place], [place], [place]   [facility] [address],  [place], [place]   [facility] - Intensive Outpatient [address] Kristeen Mans [address], [place], [place]   [facility] [place].,  [place], [place] [phone] or [phone]   [facility] Services (RTS) [address] [place], [place] Accepts Medicaid  [facility] [place] [address]  [place] [phone] Substance Abuse/Addiction Treatment   [facility] Organization         Address  Phone  Notes  [facility]  [phone]   Domenic Schwab, PhD [address] Arlis Porta [place], [place]   [phone] or [phone]   [facility] [place], [place] [phone]   [place] [address], [place], [place] [phone] Insurance/Medicaid/sponsorship through [employer] and Families [address] [place]                                    [place], [place] [phone] [place] [address]   [place], [place] [phone])  [phone]    Dr. Adele Schilder  [phone]   [facility] Dept. 1) [address], [place] 2) [place] 3)  [place] [address], [place] [phone]  [phone]   [place] [phone] or [phone] (After Hours)

## 2015-10-14 ENCOUNTER — Encounter: Payer: Self-pay | Admitting: Family Medicine

## 2015-10-16 LAB — CULTURE, GROUP A STREP: STREP A CULTURE: NEGATIVE

## 2015-10-22 ENCOUNTER — Emergency Department (HOSPITAL_COMMUNITY)
Admission: EM | Admit: 2015-10-22 | Discharge: 2015-10-22 | Disposition: A | Discharge status: Home / Self Care | Payer: Medicare Other | Attending: Emergency Medicine | Admitting: Emergency Medicine

## 2015-10-22 ENCOUNTER — Encounter (HOSPITAL_COMMUNITY): Payer: Self-pay | Admitting: Physical Medicine and Rehabilitation

## 2015-10-22 ENCOUNTER — Other Ambulatory Visit: Payer: Self-pay | Admitting: Internal Medicine

## 2015-10-22 ENCOUNTER — Emergency Department (HOSPITAL_COMMUNITY): Payer: Medicare Other

## 2015-10-22 DIAGNOSIS — G43909 Migraine, unspecified, not intractable, without status migrainosus: Secondary | ICD-10-CM | POA: Diagnosis not present

## 2015-10-22 DIAGNOSIS — I472 Ventricular tachycardia: Secondary | ICD-10-CM | POA: Diagnosis not present

## 2015-10-22 DIAGNOSIS — I252 Old myocardial infarction: Secondary | ICD-10-CM | POA: Diagnosis not present

## 2015-10-22 DIAGNOSIS — Z7982 Long term (current) use of aspirin: Secondary | ICD-10-CM | POA: Insufficient documentation

## 2015-10-22 DIAGNOSIS — Z79899 Other long term (current) drug therapy: Secondary | ICD-10-CM | POA: Diagnosis not present

## 2015-10-22 DIAGNOSIS — Z880 Allergy status to penicillin: Secondary | ICD-10-CM | POA: Diagnosis not present

## 2015-10-22 DIAGNOSIS — Z87828 Personal history of other (healed) physical injury and trauma: Secondary | ICD-10-CM | POA: Insufficient documentation

## 2015-10-22 DIAGNOSIS — I4901 Ventricular fibrillation: Secondary | ICD-10-CM | POA: Diagnosis not present

## 2015-10-22 DIAGNOSIS — Z8619 Personal history of other infectious and parasitic diseases: Secondary | ICD-10-CM | POA: Diagnosis not present

## 2015-10-22 DIAGNOSIS — Z8719 Personal history of other diseases of the digestive system: Secondary | ICD-10-CM | POA: Diagnosis not present

## 2015-10-22 DIAGNOSIS — I2510 Atherosclerotic heart disease of native coronary artery without angina pectoris: Secondary | ICD-10-CM | POA: Insufficient documentation

## 2015-10-22 DIAGNOSIS — I5030 Unspecified diastolic (congestive) heart failure: Secondary | ICD-10-CM | POA: Insufficient documentation

## 2015-10-22 DIAGNOSIS — Z8701 Personal history of pneumonia (recurrent): Secondary | ICD-10-CM | POA: Insufficient documentation

## 2015-10-22 DIAGNOSIS — Z86718 Personal history of other venous thrombosis and embolism: Secondary | ICD-10-CM | POA: Diagnosis not present

## 2015-10-22 DIAGNOSIS — M109 Gout, unspecified: Secondary | ICD-10-CM | POA: Insufficient documentation

## 2015-10-22 DIAGNOSIS — R05 Cough: Secondary | ICD-10-CM | POA: Diagnosis present

## 2015-10-22 DIAGNOSIS — Z862 Personal history of diseases of the blood and blood-forming organs and certain disorders involving the immune mechanism: Secondary | ICD-10-CM | POA: Diagnosis not present

## 2015-10-22 DIAGNOSIS — J069 Acute upper respiratory infection, unspecified: Secondary | ICD-10-CM | POA: Diagnosis not present

## 2015-10-22 DIAGNOSIS — R011 Cardiac murmur, unspecified: Secondary | ICD-10-CM | POA: Insufficient documentation

## 2015-10-22 DIAGNOSIS — N183 Chronic kidney disease, stage 3 (moderate): Secondary | ICD-10-CM | POA: Insufficient documentation

## 2015-10-22 DIAGNOSIS — B9789 Other viral agents as the cause of diseases classified elsewhere: Secondary | ICD-10-CM

## 2015-10-22 DIAGNOSIS — Z95810 Presence of automatic (implantable) cardiac defibrillator: Secondary | ICD-10-CM | POA: Insufficient documentation

## 2015-10-22 MED ORDER — PSEUDOEPH-BROMPHEN-DM 30-2-10 MG/5ML PO SYRP: 5.0000 mL | ORAL_SOLUTION | Freq: Four times a day (QID) | ORAL | Status: DC | PRN

## 2015-10-22 NOTE — Discharge Instructions (Signed)
Upper Respiratory Infection, Adult Most upper respiratory infections (URIs) are a viral infection of the air passages leading to the lungs. A URI affects the nose, throat, and upper air passages. The most common type of URI is nasopharyngitis and is typically referred to as "the common cold." URIs run their course and usually go away on their own. Most of the time, a URI does not require medical attention, but sometimes a bacterial infection in the upper airways can follow a viral infection. This is called a secondary infection. Sinus and middle ear infections are common types of secondary upper respiratory infections. Bacterial pneumonia can also complicate a URI. A URI can worsen asthma and chronic obstructive pulmonary disease (COPD). Sometimes, these complications can require emergency medical care and may be life threatening.  CAUSES Almost all URIs are caused by viruses. A virus is a type of germ and can spread from one person to another.  RISKS FACTORS You may be at risk for a URI if:   You smoke.   You have chronic heart or lung disease.  You have a weakened defense (immune) system.   You are very young or very old.   You have nasal allergies or asthma.  You work in crowded or poorly ventilated areas.  You work in health care facilities or schools. SIGNS AND SYMPTOMS  Symptoms typically develop 2-3 days after you come in contact with a cold virus. Most viral URIs last 7-10 days. However, viral URIs from the influenza virus (flu virus) can last 14-18 days and are typically more severe. Symptoms may include:   Runny or stuffy (congested) nose.   Sneezing.   Cough.   Sore throat.   Headache.   Fatigue.   Fever.   Loss of appetite.   Pain in your forehead, behind your eyes, and over your cheekbones (sinus pain).  Muscle aches.  DIAGNOSIS  Your health care provider may diagnose a URI by:  Physical exam.  Tests to check that your symptoms are not due to  another condition such as:  Strep throat.  Sinusitis.  Pneumonia.  Asthma. TREATMENT  A URI goes away on its own with time. It cannot be cured with medicines, but medicines may be prescribed or recommended to relieve symptoms. Medicines may help:  Reduce your fever.  Reduce your cough.  Relieve nasal congestion. HOME CARE INSTRUCTIONS   Take medicines only as directed by your health care provider.   Gargle warm saltwater or take cough drops to comfort your throat as directed by your health care provider.  Use a warm mist humidifier or inhale steam from a shower to increase air moisture. This may make it easier to breathe.  Drink enough fluid to keep your urine clear or pale yellow.   Eat soups and other clear broths and maintain good nutrition.   Rest as needed.   Return to work when your temperature has returned to normal or as your health care provider advises. You may need to stay home longer to avoid infecting others. You can also use a face mask and careful hand washing to prevent spread of the virus.  Increase the usage of your inhaler if you have asthma.   Do not use any tobacco products, including cigarettes, chewing tobacco, or electronic cigarettes. If you need help quitting, ask your health care provider. PREVENTION  The best way to protect yourself from getting a cold is to practice good hygiene.   Avoid oral or hand contact with people with cold   symptoms.   Wash your hands often if contact occurs.  There is no clear evidence that vitamin C, vitamin E, echinacea, or exercise reduces the chance of developing a cold. However, it is always recommended to get plenty of rest, exercise, and practice good nutrition.  SEEK MEDICAL CARE IF:   You are getting worse rather than better.   Your symptoms are not controlled by medicine.   You have chills.  You have worsening shortness of breath.  You have brown or red mucus.  You have yellow or brown nasal  discharge.  You have pain in your face, especially when you bend forward.  You have a fever.  You have swollen neck glands.  You have pain while swallowing.  You have white areas in the back of your throat. SEEK IMMEDIATE MEDICAL CARE IF:   You have severe or persistent:  Headache.  Ear pain.  Sinus pain.  Chest pain.  You have chronic lung disease and any of the following:  Wheezing.  Prolonged cough.  Coughing up blood.  A change in your usual mucus.  You have a stiff neck.  You have changes in your:  Vision.  Hearing.  Thinking.  Mood. MAKE SURE YOU:   Understand these instructions.  Will watch your condition.  Will get help right away if you are not doing well or get worse.   This information is not intended to replace advice given to you by your health care provider. Make sure you discuss any questions you have with your health care provider.   Document Released: 04/26/2001 Document Revised: 03/17/2015 Document Reviewed: 02/05/2014 Elsevier Interactive Patient Education 2016 [employer]vier Inc.  
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
## 2015-10-22 NOTE — ED Notes (Signed)
See PA notes 

## 2015-10-22 NOTE — ED Notes (Signed)
Pt reports productive cough, body aches, and fatigue. Was seen here recently for same and prescribed clindamycin. No relief with medications at home.

## 2015-11-26 ENCOUNTER — Telehealth: Payer: Self-pay | Admitting: *Deleted

## 2015-11-26 ENCOUNTER — Encounter: Payer: Medicare Other | Admitting: *Deleted

## 2015-11-26 ENCOUNTER — Telehealth: Payer: Self-pay | Admitting: Cardiology

## 2015-11-26 NOTE — Telephone Encounter (Signed)
LMOVM reminding pt to send remote transmission.   

## 2015-11-26 NOTE — Telephone Encounter (Signed)
Returned pt call.  He states increase in fatigue and sob.  He asks if he can be scheduled for lab/phlebotomy next Wed, Thurs or Friday after 3 pm?  States last phlebotomy was canceled but his levels were "borderline" and he feels it is worse now.

## 2015-11-27 ENCOUNTER — Emergency Department (HOSPITAL_COMMUNITY): Payer: Medicare Other

## 2015-11-27 ENCOUNTER — Encounter (HOSPITAL_COMMUNITY): Payer: Self-pay

## 2015-11-27 ENCOUNTER — Emergency Department (HOSPITAL_COMMUNITY)
Admission: EM | Admit: 2015-11-27 | Discharge: 2015-11-28 | Discharge status: Against Medical Advice | Payer: Medicare Other | Attending: Emergency Medicine | Admitting: Emergency Medicine

## 2015-11-27 ENCOUNTER — Telehealth: Payer: Self-pay | Admitting: *Deleted

## 2015-11-27 ENCOUNTER — Other Ambulatory Visit: Payer: Self-pay | Admitting: Hematology and Oncology

## 2015-11-27 ENCOUNTER — Telehealth: Payer: Self-pay | Admitting: Hematology and Oncology

## 2015-11-27 DIAGNOSIS — R011 Cardiac murmur, unspecified: Secondary | ICD-10-CM | POA: Insufficient documentation

## 2015-11-27 DIAGNOSIS — I5030 Unspecified diastolic (congestive) heart failure: Secondary | ICD-10-CM | POA: Diagnosis not present

## 2015-11-27 DIAGNOSIS — I2510 Atherosclerotic heart disease of native coronary artery without angina pectoris: Secondary | ICD-10-CM | POA: Insufficient documentation

## 2015-11-27 DIAGNOSIS — R200 Anesthesia of skin: Secondary | ICD-10-CM | POA: Diagnosis present

## 2015-11-27 DIAGNOSIS — I252 Old myocardial infarction: Secondary | ICD-10-CM | POA: Insufficient documentation

## 2015-11-27 DIAGNOSIS — N183 Chronic kidney disease, stage 3 (moderate): Secondary | ICD-10-CM | POA: Insufficient documentation

## 2015-11-27 DIAGNOSIS — D751 Secondary polycythemia: Secondary | ICD-10-CM

## 2015-11-27 LAB — COMPREHENSIVE METABOLIC PANEL
ALK PHOS: 78 U/L (ref 38–126)
ALT: 24 U/L (ref 17–63)
ANION GAP: 8 (ref 5–15)
AST: 36 U/L (ref 15–41)
Albumin: 3.9 g/dL (ref 3.5–5.0)
BILIRUBIN TOTAL: 0.4 mg/dL (ref 0.3–1.2)
BUN: 18 mg/dL (ref 6–20)
CALCIUM: 9.6 mg/dL (ref 8.9–10.3)
CO2: 25 mmol/L (ref 22–32)
CREATININE: 1.66 mg/dL — AB (ref 0.61–1.24)
Chloride: 106 mmol/L (ref 101–111)
GFR, EST AFRICAN AMERICAN: 53 mL/min — AB (ref >60)
GFR, EST NON AFRICAN AMERICAN: 46 mL/min — AB (ref >60)
Glucose, Bld: 169 mg/dL — ABNORMAL HIGH (ref 65–99)
Potassium: 3.8 mmol/L (ref 3.5–5.1)
Sodium: 139 mmol/L (ref 135–145)
TOTAL PROTEIN: 6.9 g/dL (ref 6.5–8.1)

## 2015-11-27 LAB — I-STAT CHEM 8, ED
BUN: 22 mg/dL — ABNORMAL HIGH (ref 6–20)
CALCIUM ION: 1.2 mmol/L (ref 1.12–1.23)
CREATININE: 1.6 mg/dL — AB (ref 0.61–1.24)
Chloride: 104 mmol/L (ref 101–111)
GLUCOSE: 163 mg/dL — AB (ref 65–99)
HCT: 47 % (ref 39.0–52.0)
HEMOGLOBIN: 16 g/dL (ref 13.0–17.0)
POTASSIUM: 3.7 mmol/L (ref 3.5–5.1)
Sodium: 140 mmol/L (ref 135–145)
TCO2: 22 mmol/L (ref 0–100)

## 2015-11-27 LAB — URINALYSIS, ROUTINE W REFLEX MICROSCOPIC
BILIRUBIN URINE: NEGATIVE
GLUCOSE, UA: NEGATIVE mg/dL
Ketones, ur: NEGATIVE mg/dL
Leukocytes, UA: NEGATIVE
Nitrite: NEGATIVE
Protein, ur: >300 mg/dL — AB
SPECIFIC GRAVITY, URINE: 1.022 (ref 1.005–1.030)
pH: 5.5 (ref 5.0–8.0)

## 2015-11-27 LAB — DIFFERENTIAL
Basophils Absolute: 0 10*3/uL (ref 0.0–0.1)
Basophils Relative: 0 %
EOS PCT: 1 %
Eosinophils Absolute: 0.1 10*3/uL (ref 0.0–0.7)
LYMPHS ABS: 1.3 10*3/uL (ref 0.7–4.0)
LYMPHS PCT: 29 %
MONO ABS: 0.2 10*3/uL (ref 0.1–1.0)
MONOS PCT: 5 %
NEUTROS ABS: 2.8 10*3/uL (ref 1.7–7.7)
Neutrophils Relative %: 64 %

## 2015-11-27 LAB — CBC
HEMATOCRIT: 43.6 % (ref 39.0–52.0)
Hemoglobin: 15.9 g/dL (ref 13.0–17.0)
MCH: 39.8 pg — AB (ref 26.0–34.0)
MCHC: 36.5 g/dL — ABNORMAL HIGH (ref 30.0–36.0)
MCV: 109 fL — AB (ref 78.0–100.0)
Platelets: 99 10*3/uL — ABNORMAL LOW (ref 150–400)
RBC: 4 MIL/uL — AB (ref 4.22–5.81)
RDW: 12.2 % (ref 11.5–15.5)
WBC: 4.3 10*3/uL (ref 4.0–10.5)

## 2015-11-27 LAB — I-STAT TROPONIN, ED: Troponin i, poc: 0.1 ng/mL (ref 0.00–0.08)

## 2015-11-27 LAB — URINE MICROSCOPIC-ADD ON

## 2015-11-27 LAB — APTT: aPTT: 36 seconds (ref 24–37)

## 2015-11-27 LAB — PROTIME-INR
INR: 1.05 (ref 0.00–1.49)
PROTHROMBIN TIME: 13.9 s (ref 11.6–15.2)

## 2015-11-27 NOTE — ED Notes (Signed)
Charge notified to interrogate pacemaker/defib

## 2015-11-27 NOTE — ED Notes (Signed)
Pt here for numbness to entire right leg onset about two days ago. Hx of DVT. Pt also reports loss of taste and is smelling cigarettes although he does not smoke. Denies weakness or trouble walking.

## 2015-11-27 NOTE — Telephone Encounter (Signed)
Called and left a message with 11/8 appointment.

## 2015-11-27 NOTE — Telephone Encounter (Signed)
It is not clear he needs phlebotomy His last blood work in November actually showed he was anemic We have no opening Better to put it for next week Please check with infusion room first to check for availability and place POF when you think we can put labs and phlebotomy same day I will make sure he has orders

## 2015-11-27 NOTE — Telephone Encounter (Signed)
appt made for 1/18 and Scheduler left pt a message.

## 2015-11-28 ENCOUNTER — Emergency Department (HOSPITAL_COMMUNITY)
Admission: EM | Admit: 2015-11-28 | Discharge: 2015-11-28 | Disposition: A | Discharge status: Home / Self Care | Payer: Medicare Other | Source: Home / Self Care | Attending: Emergency Medicine | Admitting: Emergency Medicine

## 2015-11-28 ENCOUNTER — Emergency Department (HOSPITAL_COMMUNITY): Payer: Medicare Other

## 2015-11-28 ENCOUNTER — Encounter (HOSPITAL_COMMUNITY): Payer: Self-pay | Admitting: Emergency Medicine

## 2015-11-28 DIAGNOSIS — I2510 Atherosclerotic heart disease of native coronary artery without angina pectoris: Secondary | ICD-10-CM | POA: Insufficient documentation

## 2015-11-28 DIAGNOSIS — Z880 Allergy status to penicillin: Secondary | ICD-10-CM

## 2015-11-28 DIAGNOSIS — R7989 Other specified abnormal findings of blood chemistry: Secondary | ICD-10-CM

## 2015-11-28 DIAGNOSIS — R431 Parosmia: Secondary | ICD-10-CM | POA: Insufficient documentation

## 2015-11-28 DIAGNOSIS — Z8619 Personal history of other infectious and parasitic diseases: Secondary | ICD-10-CM | POA: Insufficient documentation

## 2015-11-28 DIAGNOSIS — I252 Old myocardial infarction: Secondary | ICD-10-CM | POA: Insufficient documentation

## 2015-11-28 DIAGNOSIS — R39198 Other difficulties with micturition: Secondary | ICD-10-CM | POA: Insufficient documentation

## 2015-11-28 DIAGNOSIS — K219 Gastro-esophageal reflux disease without esophagitis: Secondary | ICD-10-CM

## 2015-11-28 DIAGNOSIS — G43909 Migraine, unspecified, not intractable, without status migrainosus: Secondary | ICD-10-CM

## 2015-11-28 DIAGNOSIS — R778 Other specified abnormalities of plasma proteins: Secondary | ICD-10-CM

## 2015-11-28 DIAGNOSIS — R197 Diarrhea, unspecified: Secondary | ICD-10-CM

## 2015-11-28 DIAGNOSIS — I472 Ventricular tachycardia: Secondary | ICD-10-CM

## 2015-11-28 DIAGNOSIS — Z8669 Personal history of other diseases of the nervous system and sense organs: Secondary | ICD-10-CM | POA: Insufficient documentation

## 2015-11-28 DIAGNOSIS — Z8739 Personal history of other diseases of the musculoskeletal system and connective tissue: Secondary | ICD-10-CM | POA: Insufficient documentation

## 2015-11-28 DIAGNOSIS — Z8639 Personal history of other endocrine, nutritional and metabolic disease: Secondary | ICD-10-CM

## 2015-11-28 DIAGNOSIS — Z95810 Presence of automatic (implantable) cardiac defibrillator: Secondary | ICD-10-CM | POA: Insufficient documentation

## 2015-11-28 DIAGNOSIS — Z86718 Personal history of other venous thrombosis and embolism: Secondary | ICD-10-CM | POA: Insufficient documentation

## 2015-11-28 DIAGNOSIS — R011 Cardiac murmur, unspecified: Secondary | ICD-10-CM | POA: Insufficient documentation

## 2015-11-28 DIAGNOSIS — Z7982 Long term (current) use of aspirin: Secondary | ICD-10-CM | POA: Insufficient documentation

## 2015-11-28 DIAGNOSIS — I509 Heart failure, unspecified: Secondary | ICD-10-CM

## 2015-11-28 DIAGNOSIS — Z8701 Personal history of pneumonia (recurrent): Secondary | ICD-10-CM

## 2015-11-28 DIAGNOSIS — Z862 Personal history of diseases of the blood and blood-forming organs and certain disorders involving the immune mechanism: Secondary | ICD-10-CM

## 2015-11-28 DIAGNOSIS — R200 Anesthesia of skin: Secondary | ICD-10-CM

## 2015-11-28 DIAGNOSIS — J3489 Other specified disorders of nose and nasal sinuses: Secondary | ICD-10-CM | POA: Insufficient documentation

## 2015-11-28 DIAGNOSIS — R439 Unspecified disturbances of smell and taste: Secondary | ICD-10-CM

## 2015-11-28 DIAGNOSIS — I129 Hypertensive chronic kidney disease with stage 1 through stage 4 chronic kidney disease, or unspecified chronic kidney disease: Secondary | ICD-10-CM | POA: Insufficient documentation

## 2015-11-28 DIAGNOSIS — N183 Chronic kidney disease, stage 3 (moderate): Secondary | ICD-10-CM | POA: Insufficient documentation

## 2015-11-28 LAB — I-STAT TROPONIN, ED: TROPONIN I, POC: 0.1 ng/mL — AB (ref 0.00–0.08)

## 2015-11-28 LAB — TROPONIN I: TROPONIN I: 0.09 ng/mL — AB (ref <0.031)

## 2015-11-28 NOTE — ED Notes (Signed)
Pt was seen by RN putting clothes on; Pt states he want IV out because he has not been seen by the MD; RN advised pt of abnormal labs; Pt states he will wait in room but will wait fully dressed and will not but put back on monitor;

## 2015-11-28 NOTE — ED Notes (Signed)
Patient here with complaint of right leg numbness. States onset 4 days ago. Was here earlier for the same thing, but wasn't seen by MD. Re-presents for the same complaint. Endorses history of DVT in the same leg.
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
## 2015-11-28 NOTE — Discharge Instructions (Signed)
Continue taking usual home medications as directed Please keep your scheduled cardiology appointment with Dr. Lovena Le on 12/04/15. Let him know that your troponin was elevated at today's visit. We spoke with the cardiology team on call, who believed further evaluation as an outpatient was appropriate.  Follow up with your primary physician in the next 2-4 days for further evaluation and management of today's visit.  Return to the ED for chest pain, shortness of breath, any new or worsening symptoms, any additional concerns.

## 2015-11-28 NOTE — ED Notes (Signed)
Pt hanging outside of room looking at trauma rooms; pt seems to be upset and walked to the bathroom; pt still has IV in at this time; Md notified that pt is getting inpatient;

## 2015-11-28 NOTE — ED Notes (Signed)
PA at bedside.

## 2015-11-29 ENCOUNTER — Encounter (HOSPITAL_COMMUNITY): Payer: Self-pay | Admitting: Emergency Medicine

## 2015-11-29 ENCOUNTER — Emergency Department (HOSPITAL_COMMUNITY)
Admission: EM | Admit: 2015-11-29 | Discharge: 2015-11-29 | Discharge status: Against Medical Advice | Payer: Medicare Other | Source: Home / Self Care

## 2015-11-29 DIAGNOSIS — Z95810 Presence of automatic (implantable) cardiac defibrillator: Secondary | ICD-10-CM | POA: Insufficient documentation

## 2015-11-29 DIAGNOSIS — Z8669 Personal history of other diseases of the nervous system and sense organs: Secondary | ICD-10-CM | POA: Insufficient documentation

## 2015-11-29 DIAGNOSIS — Z862 Personal history of diseases of the blood and blood-forming organs and certain disorders involving the immune mechanism: Secondary | ICD-10-CM | POA: Diagnosis not present

## 2015-11-29 DIAGNOSIS — Z8701 Personal history of pneumonia (recurrent): Secondary | ICD-10-CM | POA: Insufficient documentation

## 2015-11-29 DIAGNOSIS — I2510 Atherosclerotic heart disease of native coronary artery without angina pectoris: Secondary | ICD-10-CM | POA: Insufficient documentation

## 2015-11-29 DIAGNOSIS — I5030 Unspecified diastolic (congestive) heart failure: Secondary | ICD-10-CM | POA: Insufficient documentation

## 2015-11-29 DIAGNOSIS — I472 Ventricular tachycardia: Secondary | ICD-10-CM | POA: Diagnosis not present

## 2015-11-29 DIAGNOSIS — Z872 Personal history of diseases of the skin and subcutaneous tissue: Secondary | ICD-10-CM | POA: Diagnosis not present

## 2015-11-29 DIAGNOSIS — R109 Unspecified abdominal pain: Secondary | ICD-10-CM

## 2015-11-29 DIAGNOSIS — R011 Cardiac murmur, unspecified: Secondary | ICD-10-CM

## 2015-11-29 DIAGNOSIS — I252 Old myocardial infarction: Secondary | ICD-10-CM

## 2015-11-29 DIAGNOSIS — N183 Chronic kidney disease, stage 3 (moderate): Secondary | ICD-10-CM | POA: Insufficient documentation

## 2015-11-29 DIAGNOSIS — Z87828 Personal history of other (healed) physical injury and trauma: Secondary | ICD-10-CM | POA: Diagnosis not present

## 2015-11-29 DIAGNOSIS — R300 Dysuria: Secondary | ICD-10-CM | POA: Diagnosis not present

## 2015-11-29 DIAGNOSIS — K219 Gastro-esophageal reflux disease without esophagitis: Secondary | ICD-10-CM | POA: Diagnosis not present

## 2015-11-29 DIAGNOSIS — M109 Gout, unspecified: Secondary | ICD-10-CM | POA: Diagnosis not present

## 2015-11-29 DIAGNOSIS — Z86718 Personal history of other venous thrombosis and embolism: Secondary | ICD-10-CM | POA: Diagnosis not present

## 2015-11-29 DIAGNOSIS — Z9049 Acquired absence of other specified parts of digestive tract: Secondary | ICD-10-CM | POA: Diagnosis not present

## 2015-11-29 DIAGNOSIS — G43909 Migraine, unspecified, not intractable, without status migrainosus: Secondary | ICD-10-CM | POA: Insufficient documentation

## 2015-11-29 DIAGNOSIS — Z8709 Personal history of other diseases of the respiratory system: Secondary | ICD-10-CM | POA: Diagnosis not present

## 2015-11-29 DIAGNOSIS — Z8619 Personal history of other infectious and parasitic diseases: Secondary | ICD-10-CM | POA: Insufficient documentation

## 2015-11-29 DIAGNOSIS — Z880 Allergy status to penicillin: Secondary | ICD-10-CM | POA: Diagnosis not present

## 2015-11-29 LAB — URINALYSIS, ROUTINE W REFLEX MICROSCOPIC
Bilirubin Urine: NEGATIVE
GLUCOSE, UA: NEGATIVE mg/dL
KETONES UR: NEGATIVE mg/dL
LEUKOCYTES UA: NEGATIVE
Nitrite: NEGATIVE
PH: 5.5 (ref 5.0–8.0)
Protein, ur: 100 mg/dL — AB
Specific Gravity, Urine: 1.026 (ref 1.005–1.030)

## 2015-11-29 LAB — URINE MICROSCOPIC-ADD ON

## 2015-11-29 NOTE — ED Notes (Addendum)
Pt c/o right flank pain onset last night. Pt thinks he may have passed a kidney stone this morning. Pt also wants to be checked for STD.

## 2015-11-29 NOTE — ED Notes (Signed)
C/o R flank pain and pain with urination since this morning.  Pt states he thinks he passed a kidney stone.  States he was here earlier and had to leave.  Pt also left Friday night prior to being discharged for a different complaint.  Requesting STD check.

## 2015-11-29 NOTE — ED Notes (Signed)
Pt approached nurse first and stated " he had to leave to [place] the airport." Encouraged pt to stay but pt stated that "I will be back later" Pt left.

## 2015-11-30 ENCOUNTER — Emergency Department (HOSPITAL_COMMUNITY): Payer: Medicare Other

## 2015-11-30 ENCOUNTER — Emergency Department (HOSPITAL_COMMUNITY)
Admission: EM | Admit: 2015-11-30 | Discharge: 2015-11-30 | Disposition: A | Discharge status: Home / Self Care | Payer: Medicare Other | Attending: Emergency Medicine | Admitting: Emergency Medicine

## 2015-11-30 ENCOUNTER — Encounter (HOSPITAL_COMMUNITY): Payer: Self-pay | Admitting: Emergency Medicine

## 2015-11-30 DIAGNOSIS — R52 Pain, unspecified: Secondary | ICD-10-CM

## 2015-11-30 DIAGNOSIS — R109 Unspecified abdominal pain: Secondary | ICD-10-CM

## 2015-11-30 LAB — CBC WITH DIFFERENTIAL/PLATELET
BASOS PCT: 1 %
Basophils Absolute: 0 10*3/uL (ref 0.0–0.1)
EOS PCT: 2 %
Eosinophils Absolute: 0.1 10*3/uL (ref 0.0–0.7)
HCT: 43.8 % (ref 39.0–52.0)
Hemoglobin: 15.7 g/dL (ref 13.0–17.0)
LYMPHS ABS: 1.1 10*3/uL (ref 0.7–4.0)
Lymphocytes Relative: 29 %
MCH: 39.9 pg — ABNORMAL HIGH (ref 26.0–34.0)
MCHC: 35.8 g/dL (ref 30.0–36.0)
MCV: 111.5 fL — AB (ref 78.0–100.0)
MONO ABS: 0.4 10*3/uL (ref 0.1–1.0)
Monocytes Relative: 9 %
Neutro Abs: 2.3 10*3/uL (ref 1.7–7.7)
Neutrophils Relative %: 59 %
PLATELETS: DECREASED 10*3/uL (ref 150–400)
RBC: 3.93 MIL/uL — ABNORMAL LOW (ref 4.22–5.81)
RDW: 12.4 % (ref 11.5–15.5)
WBC: 3.9 10*3/uL — ABNORMAL LOW (ref 4.0–10.5)

## 2015-11-30 LAB — I-STAT CHEM 8, ED
BUN: 24 mg/dL — ABNORMAL HIGH (ref 6–20)
CREATININE: 1.7 mg/dL — AB (ref 0.61–1.24)
Calcium, Ion: 1.12 mmol/L (ref 1.12–1.23)
Chloride: 106 mmol/L (ref 101–111)
Glucose, Bld: 104 mg/dL — ABNORMAL HIGH (ref 65–99)
HEMATOCRIT: 43 % (ref 39.0–52.0)
HEMOGLOBIN: 14.6 g/dL (ref 13.0–17.0)
Potassium: 3.5 mmol/L (ref 3.5–5.1)
SODIUM: 138 mmol/L (ref 135–145)
TCO2: 22 mmol/L (ref 0–100)

## 2015-11-30 LAB — GC/CHLAMYDIA PROBE AMP (~~LOC~~) NOT AT ARMC
CHLAMYDIA, DNA PROBE: NEGATIVE
NEISSERIA GONORRHEA: NEGATIVE

## 2015-11-30 MED ORDER — METHOCARBAMOL 500 MG PO TABS: 500.0000 mg | ORAL_TABLET | Freq: Two times a day (BID) | ORAL | Status: DC

## 2015-11-30 MED ORDER — KETOROLAC TROMETHAMINE 60 MG/2ML IM SOLN
60.0000 mg | Freq: Once | INTRAMUSCULAR | Status: AC
  Administered 2015-11-30: 60 mg via INTRAMUSCULAR
  Filled 2015-11-30: qty 2

## 2015-11-30 MED ORDER — TAMSULOSIN HCL 0.4 MG PO CAPS: 0.4000 mg | ORAL_CAPSULE | Freq: Every day | ORAL | Status: AC

## 2015-11-30 NOTE — ED Notes (Signed)
Pt to CT
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
## 2015-12-02 ENCOUNTER — Other Ambulatory Visit: Payer: Self-pay | Admitting: Hematology and Oncology

## 2015-12-02 ENCOUNTER — Ambulatory Visit: Payer: Medicare Other

## 2015-12-02 ENCOUNTER — Other Ambulatory Visit (HOSPITAL_BASED_OUTPATIENT_CLINIC_OR_DEPARTMENT_OTHER): Payer: Medicare Other

## 2015-12-02 ENCOUNTER — Telehealth: Payer: Self-pay | Admitting: Hematology and Oncology

## 2015-12-02 VITALS — BP 129/79 | HR 60 | Temp 98.3°F | Resp 18

## 2015-12-02 DIAGNOSIS — D751 Secondary polycythemia: Secondary | ICD-10-CM

## 2015-12-02 LAB — CBC & DIFF AND RETIC
BASO%: 0.9 % (ref 0.0–2.0)
Basophils Absolute: 0.1 10*3/uL (ref 0.0–0.1)
EOS%: 1.5 % (ref 0.0–7.0)
Eosinophils Absolute: 0.1 10*3/uL (ref 0.0–0.5)
HCT: 47.2 % (ref 38.4–49.9)
HGB: 16.4 g/dL (ref 13.0–17.1)
Immature Retic Fract: 8 % (ref 3.00–10.60)
LYMPH#: 1.3 10*3/uL (ref 0.9–3.3)
LYMPH%: 20.8 % (ref 14.0–49.0)
MCH: 38.6 pg — AB (ref 27.2–33.4)
MCHC: 34.8 g/dL (ref 32.0–36.0)
MCV: 111 fL — ABNORMAL HIGH (ref 79.3–98.0)
MONO#: 0.4 10*3/uL (ref 0.1–0.9)
MONO%: 6.3 % (ref 0.0–14.0)
NEUT%: 70.5 % (ref 39.0–75.0)
NEUTROS ABS: 4.3 10*3/uL (ref 1.5–6.5)
PLATELETS: 122 10*3/uL — AB (ref 140–400)
RBC: 4.25 10*6/uL (ref 4.20–5.82)
RDW: 12.5 % (ref 11.0–14.6)
RETIC %: 2.06 % — AB (ref 0.80–1.80)
RETIC CT ABS: 87.55 10*3/uL (ref 34.80–93.90)
WBC: 6.1 10*3/uL (ref 4.0–10.3)
nRBC: 0 % (ref 0–0)

## 2015-12-02 LAB — FERRITIN: Ferritin: 63 ng/ml (ref 22–316)

## 2015-12-02 NOTE — Telephone Encounter (Signed)
I spoke with the nurse at the infusion room. Even though his hemoglobin does not meet the parameter established last year, he feels fatigued and wants to have phlebotomy today. I recommend changing the treatment parameter to keep hemoglobin under 16 g or hematocrit under 45%

## 2015-12-02 NOTE — Progress Notes (Signed)
1455-Pt HCT today 47.2. Told pt he does not require phlebotomy today but has been having some fatigue symptoms. Notified Dr. Alvy Bimler and clarified parameters for phlebotomy. Pt will get phlebotomy today and Dr. Alvy Bimler will change HCT parameters for future treatments as well.   1510-Performed phlebotomy 1 unit of blood (550cc) in 10 min.  RAC site accessed w/ 18g needle without any difficulty. Pt denies any symptoms at this time. VSS remains stable. Agreed to stay for 30 min post observation.   1535- VSS stable. Provided pt hydration and nutrition. Denies any symptoms. Declined avs at this time.

## 2015-12-04 ENCOUNTER — Emergency Department (HOSPITAL_COMMUNITY)
Admission: EM | Admit: 2015-12-04 | Discharge: 2015-12-04 | Disposition: A | Discharge status: Home / Self Care | Payer: Medicare Other | Attending: Emergency Medicine | Admitting: Emergency Medicine

## 2015-12-04 ENCOUNTER — Telehealth: Payer: Self-pay | Admitting: *Deleted

## 2015-12-04 ENCOUNTER — Encounter (HOSPITAL_COMMUNITY): Payer: Self-pay | Admitting: *Deleted

## 2015-12-04 ENCOUNTER — Ambulatory Visit (INDEPENDENT_AMBULATORY_CARE_PROVIDER_SITE_OTHER): Payer: Medicare Other | Admitting: *Deleted

## 2015-12-04 ENCOUNTER — Encounter: Payer: Self-pay | Admitting: Internal Medicine

## 2015-12-04 DIAGNOSIS — H109 Unspecified conjunctivitis: Secondary | ICD-10-CM | POA: Diagnosis not present

## 2015-12-04 DIAGNOSIS — Z79899 Other long term (current) drug therapy: Secondary | ICD-10-CM | POA: Insufficient documentation

## 2015-12-04 DIAGNOSIS — N183 Chronic kidney disease, stage 3 (moderate): Secondary | ICD-10-CM | POA: Diagnosis not present

## 2015-12-04 DIAGNOSIS — I4901 Ventricular fibrillation: Secondary | ICD-10-CM

## 2015-12-04 DIAGNOSIS — I5022 Chronic systolic (congestive) heart failure: Secondary | ICD-10-CM | POA: Diagnosis not present

## 2015-12-04 DIAGNOSIS — I2510 Atherosclerotic heart disease of native coronary artery without angina pectoris: Secondary | ICD-10-CM | POA: Diagnosis not present

## 2015-12-04 DIAGNOSIS — R05 Cough: Secondary | ICD-10-CM | POA: Insufficient documentation

## 2015-12-04 DIAGNOSIS — R011 Cardiac murmur, unspecified: Secondary | ICD-10-CM | POA: Diagnosis not present

## 2015-12-04 DIAGNOSIS — Z95810 Presence of automatic (implantable) cardiac defibrillator: Secondary | ICD-10-CM

## 2015-12-04 DIAGNOSIS — I5030 Unspecified diastolic (congestive) heart failure: Secondary | ICD-10-CM | POA: Diagnosis not present

## 2015-12-04 DIAGNOSIS — Z86718 Personal history of other venous thrombosis and embolism: Secondary | ICD-10-CM | POA: Diagnosis not present

## 2015-12-04 DIAGNOSIS — Z7982 Long term (current) use of aspirin: Secondary | ICD-10-CM | POA: Insufficient documentation

## 2015-12-04 DIAGNOSIS — I252 Old myocardial infarction: Secondary | ICD-10-CM | POA: Insufficient documentation

## 2015-12-04 DIAGNOSIS — Z8701 Personal history of pneumonia (recurrent): Secondary | ICD-10-CM | POA: Insufficient documentation

## 2015-12-04 DIAGNOSIS — R059 Cough, unspecified: Secondary | ICD-10-CM

## 2015-12-04 DIAGNOSIS — Z880 Allergy status to penicillin: Secondary | ICD-10-CM | POA: Insufficient documentation

## 2015-12-04 LAB — CUP PACEART INCLINIC DEVICE CHECK
Brady Statistic RV Percent Paced: 0.01 %
HIGH POWER IMPEDANCE MEASURED VALUE: 48 Ohm
HighPow Impedance: 342 Ohm
HighPow Impedance: 39 Ohm
Lead Channel Impedance Value: 323 Ohm
Lead Channel Pacing Threshold Pulse Width: 0.4 ms
Lead Channel Sensing Intrinsic Amplitude: 16 mV
Lead Channel Setting Sensing Sensitivity: 0.3 mV
MDC IDC LEAD IMPLANT DT: 20120711
MDC IDC LEAD LOCATION: 753860
MDC IDC LEAD MODEL: 184
MDC IDC LEAD SERIAL: 324224
MDC IDC MSMT BATTERY VOLTAGE: 3.01 V
MDC IDC MSMT LEADCHNL RV PACING THRESHOLD AMPLITUDE: 1.25 V
MDC IDC SESS DTM: 20170120115251
MDC IDC SET LEADCHNL RV PACING AMPLITUDE: 2.75 V
MDC IDC SET LEADCHNL RV PACING PULSEWIDTH: 0.4 ms

## 2015-12-04 MED ORDER — BENZONATATE 100 MG PO CAPS
100.0000 mg | ORAL_CAPSULE | Freq: Once | ORAL | Status: AC
  Administered 2015-12-04: 100 mg via ORAL
  Filled 2015-12-04: qty 1

## 2015-12-04 MED ORDER — FLUORESCEIN SODIUM 1 MG OP STRP
1.0000 | ORAL_STRIP | Freq: Once | OPHTHALMIC | Status: AC
  Administered 2015-12-04: 1 via OPHTHALMIC
  Filled 2015-12-04: qty 1

## 2015-12-04 MED ORDER — TETRACAINE HCL 0.5 % OP SOLN
1.0000 [drp] | Freq: Once | OPHTHALMIC | Status: AC
  Administered 2015-12-04: 1 [drp] via OPHTHALMIC
  Filled 2015-12-04: qty 2

## 2015-12-04 MED ORDER — BENZONATATE 100 MG PO CAPS: 100.0000 mg | ORAL_CAPSULE | Freq: Three times a day (TID) | ORAL | Status: AC

## 2015-12-04 MED ORDER — POLYMYXIN B-TRIMETHOPRIM 10000-0.1 UNIT/ML-% OP SOLN: 1.0000 [drp] | OPHTHALMIC | Status: DC

## 2015-12-04 MED ORDER — HYDROCOD POLST-CPM POLST ER 10-8 MG/5ML PO SUER: 5.0000 mL | Freq: Every evening | ORAL | Status: AC | PRN

## 2015-12-04 NOTE — Discharge Instructions (Signed)
1. Medications: polytrim eye drops, cough medicine, usual home medications 2. Treatment: rest, drink plenty of fluids, use cold compresses for eye irritation 3. Follow Up: please followup with your primary doctor and with ophthalmology for discussion of your diagnoses and further evaluation after today's visit; if you do not have a primary care doctor use the resource guide provided to find one; please return to the ER for vision changes, severe eye pain, high fever, chest pain, shortness of breath, new or worsening symptoms   Bacterial Conjunctivitis Bacterial conjunctivitis, commonly called pink eye, is an inflammation of the clear membrane that covers the white part of the eye (conjunctiva). The inflammation can also happen on the underside of the eyelids. The blood vessels in the conjunctiva become inflamed, causing the eye to become red or pink. Bacterial conjunctivitis may spread easily from one eye to another and from person to person (contagious).  CAUSES  Bacterial conjunctivitis is caused by bacteria. The bacteria may come from your own skin, your upper respiratory tract, or from someone else with bacterial conjunctivitis. SYMPTOMS  The normally white color of the eye or the underside of the eyelid is usually pink or red. The pink eye is usually associated with irritation, tearing, and some sensitivity to light. Bacterial conjunctivitis is often associated with a thick, yellowish discharge from the eye. The discharge may turn into a crust on the eyelids overnight, which causes your eyelids to stick together. If a discharge is present, there may also be some blurred vision in the affected eye. DIAGNOSIS  Bacterial conjunctivitis is diagnosed by your caregiver through an eye exam and the symptoms that you report. Your caregiver looks for changes in the surface tissues of your eyes, which may point to the specific type of conjunctivitis. A sample of any discharge may be collected on a cotton-tip  swab if you have a severe case of conjunctivitis, if your cornea is affected, or if you keep getting repeat infections that do not respond to treatment. The sample will be sent to a lab to see if the inflammation is caused by a bacterial infection and to see if the infection will respond to antibiotic medicines. TREATMENT   Bacterial conjunctivitis is treated with antibiotics. Antibiotic eyedrops are most often used. However, antibiotic ointments are also available. Antibiotics pills are sometimes used. Artificial tears or eye washes may ease discomfort. HOME CARE INSTRUCTIONS   To ease discomfort, apply a cool, clean washcloth to your eye for 10-20 minutes, 3-4 times a day.  Gently wipe away any drainage from your eye with a warm, wet washcloth or a cotton ball.  Wash your hands often with soap and water. Use paper towels to dry your hands.  Do not share towels or washcloths. This may spread the infection.  Change or wash your pillowcase every day.  You should not use eye makeup until the infection is gone.  Do not operate machinery or drive if your vision is blurred.  Stop using contact lenses. Ask your caregiver how to sterilize or replace your contacts before using them again. This depends on the type of contact lenses that you use.  When applying medicine to the infected eye, do not touch the edge of your eyelid with the eyedrop bottle or ointment tube. SEEK IMMEDIATE MEDICAL CARE IF:   Your infection has not improved within 3 days after beginning treatment.  You had yellow discharge from your eye and it returns.  You have increased eye pain.  Your eye redness is  spreading.  Your vision becomes blurred.  You have a fever or persistent symptoms for more than 2-3 days.  You have a fever and your symptoms suddenly get worse.  You have facial pain, redness, or swelling. MAKE SURE YOU:   Understand these instructions.  Will watch your condition.  Will get help right away  if you are not doing well or get worse.   This information is not intended to replace advice given to you by your health care provider. Make sure you discuss any questions you have with your health care provider.   Document Released: 10/31/2005 Document Revised: 11/21/2014 Document Reviewed: 04/02/2012 Elsevier Interactive Patient Education 2016 Elsevier Inc.  Cough, Adult Coughing is a reflex that clears your throat and your airways. Coughing helps to heal and protect your lungs. It is normal to cough occasionally, but a cough that happens with other symptoms or lasts a long time may be a sign of a condition that needs treatment. A cough may last only 2-3 weeks (acute), or it may last longer than 8 weeks (chronic). CAUSES Coughing is commonly caused by:  Breathing in substances that irritate your lungs.  A viral or bacterial respiratory infection.  Allergies.  Asthma.  Postnasal drip.  Smoking.  Acid backing up from the stomach into the esophagus (gastroesophageal reflux).  Certain medicines.  Chronic lung problems, including COPD (or rarely, lung cancer).  Other medical conditions such as heart failure. HOME CARE INSTRUCTIONS  Pay attention to any changes in your symptoms. Take these actions to help with your discomfort:  Take medicines only as told by your health care provider.  If you were prescribed an antibiotic medicine, take it as told by your health care provider. Do not stop taking the antibiotic even if you start to feel better.  Talk with your health care provider before you take a cough suppressant medicine.  Drink enough fluid to keep your urine clear or pale yellow.  If the air is dry, use a cold steam vaporizer or humidifier in your bedroom or your home to help loosen secretions.  Avoid anything that causes you to cough at work or at home.  If your cough is worse at night, try sleeping in a semi-upright position.  Avoid cigarette smoke. If you smoke,  quit smoking. If you need help quitting, ask your health care provider.  Avoid caffeine.  Avoid alcohol.  Rest as needed. SEEK MEDICAL CARE IF:   You have new symptoms.  You cough up pus.  Your cough does not get better after 2-3 weeks, or your cough gets worse.  You cannot control your cough with suppressant medicines and you are losing sleep.  You develop pain that is getting worse or pain that is not controlled with pain medicines.  You have a fever.  You have unexplained weight loss.  You have night sweats. SEEK IMMEDIATE MEDICAL CARE IF:  You cough up blood.  You have difficulty breathing.  Your heartbeat is very fast.   This information is not intended to replace advice given to you by your health care provider. Make sure you discuss any questions you have with your health care provider.   Document Released: 04/29/2011 Document Revised: 07/22/2015 Document Reviewed: 01/07/2015 Elsevier Interactive Patient Education 2016 [employer].   Emergency Department Resource Guide 1) Find a Doctor and Pay Out of Pocket Although you won't have to find out who is covered by your insurance plan, it is a good idea to ask around and get  recommendations. You will then need to call the office and see if the doctor you have chosen will accept you as a new patient and what types of options they offer for patients who are self-pay. Some doctors offer discounts or will set up payment plans for their patients who do not have insurance, but you will need to ask so you aren't surprised when you get to your appointment.  2) Contact Your Local Health Department Not all health departments have doctors that can see patients for sick visits, but many do, so it is worth a call to see if yours does. If you don't know where your local health department is, you can check in your phone book. The CDC also has a tool to help you locate your state's health department, and many state websites also have  listings of all of their local health departments.  3) Find a [facility] If your illness is not likely to be very severe or complicated, you may want to try a walk in clinic. These are popping up all over the country in pharmacies, drugstores, and shopping centers. They're usually staffed by nurse practitioners or physician assistants that have been trained to treat common illnesses and complaints. They're usually fairly quick and inexpensive. However, if you have serious medical issues or chronic medical problems, these are probably not your best option.  No Primary Care Doctor: - Call Health Connect at  [phone] - they can help you locate a primary care doctor that  accepts your insurance, provides certain services, etc. - Physician Referral Service- [phone]  Chronic Pain Problems: Organization         Address  Phone   Notes  [facility]  [phone] Patients need to be referred by their primary care doctor.   Medication Assistance: Organization         Address  Phone   Notes  [facility] Medication [facility] [place], [place] [address] [phone] --Must be a resident of [facility] -- Must have NO insurance coverage whatsoever (no Medicaid/ Medicare, etc.) -- The pt. MUST have a primary care doctor that directs their care regularly and follows them in the community   MedAssist  [phone]   [employer]  [phone]    Agencies that provide inexpensive medical care: Organization         Address  Phone   Notes  [place]  [phone]   [facility]    [phone]   [facility] [place], [place] [address] [phone]   [place] [address], [place] [phone]   Planned Parenthood    [phone]   [facility]    [phone]   [place] and [place]  [address], [place] Phone:  [phone], Fax:  [phone] Hours of Operation:  9 am - 6 pm, M-F.  Also accepts Medicaid/Medicare and self-pay.  [facility] for [facility] [place], [address], [place] Phone: [phone], Fax: [phone]. Hours of Operation:  8:30 am - 5:30 pm, M-F.  Also accepts Medicaid and self-pay.  [facility] [place] [address] [place] Phone: [phone]   [place], [place], [place] [phone], Ext. [phone] Mondays & Thursdays: 7-9 AM.  First 15 patients are seen on a first  come, first serve basis.    [place] Providers:  Organization         Address  Phone   Notes  [facility] [address], [place] [phone] Also accepts self-pay patients.  [facility] [id number] [place], [place]  [phone]   [place], [address], [place] [phone]   [facility] [address], [place] [phone]   Lucianne Lei [address], [place]   [phone] Only accepts [place] Access [place] patients after they have their name applied to their card.   Self-Pay (no insurance) in [facility]:  Organization         Address  Phone   Notes  Sickle Cell Patients, [facility] Internal Medicine [place] [phone]   [facility] [place] [phone]   [facility] [place]  [place], [place], [place] [phone]   [facility]/Dr. Osei-Bonsu  [address] [place] or [address], [place] [phone] Phone number for both [place] and [place] locations is the same.  Urgent Medical and [facility] [address], [place] [phone]   [facility] [address]  [place] or [address] [phone]   [facility] [address] [place] [phone], phone; [phone], fax Sees patients 1st and 3rd Saturday of every month.  Must not qualify for public or private insurance (i.e. Medicaid, Medicare, [place] Health Choice, Veterans' Benefits)  Household income should be no more than 200% of the poverty level The clinic cannot treat you if you are pregnant or think you are pregnant  Sexually transmitted diseases are not treated at the clinic.    Dental Care: Organization         Address  Phone  Notes  [facility] Department of [facility] [place] [phone] Accepts children up to age 24 who are enrolled in [place] or [place]; pregnant women with a Medicaid card; and children who have applied for Medicaid or [place] Health Choice, but were declined, whose parents can pay a reduced fee at time of service.  [facility] Department of [facility]  [address], [place] [phone] Accepts children up to age 33 who are enrolled in [place] or [place]; pregnant women with a Medicaid card; and children who have applied for Medicaid or [place] Health Choice, but were declined, whose parents can pay a reduced fee at time of service.  [place] Adult Dental Access PROGRAM  [place] [phone] Patients are seen by appointment only. Walk-ins are not accepted. [place] will see patients 6 years of age and older. Monday - Tuesday (8am-5pm) Most Wednesdays (8:30-5pm) $30 per visit, cash only  [facility] Adult Dental Access PROGRAM  [address], [facility] [phone] Patients are seen by appointment only. Walk-ins are not accepted. [place] will see patients 50 years of age and older. One Wednesday Evening (Monthly: Volunteer Based).  $30 per visit, cash only  [place]  [phone] for adults; Children under age 57, call Graduate Pediatric Dentistry at [phone]. Children aged 39-14,  please call [phone] to request a pediatric application.  Dental services are provided in all areas of dental care including fillings, crowns and bridges, complete and partial dentures, implants, gum treatment, root canals, and extractions. Preventive care is also provided. Treatment is provided to both adults and children. Patients are selected via a lottery and there is often a waiting list.   [facility] [address] [place]  [phone] [email or web address]   [facility] [address] [place], [place] [phone], Ext. [phone] Second and Fourth Thursday of each month, opens at 6:30 AM; Clinic ends at 9 AM.  Patients are seen on a first-come first-served basis, and a limited number are seen during each clinic.   [facility]  [address] [place] Danker [place], [place] [phone]   Eligibility Requirements You must have lived in [place], [place], or [place] counties for at least the last three months.   You cannot be eligible for state or federal sponsored [employer], including [employer], [place], or [employer].   You generally cannot be eligible for healthcare insurance through your employer.    How to apply: Eligibility screenings are held every Tuesday and Wednesday afternoon from 1:00 pm until 4:00 pm. You do not need an appointment for the interview!  [facility] [address], [place], [place]   [place]  [place] Department  [place]  [phone]    Behavioral Health Resources in the Community: Intensive Outpatient Programs Organization         Address  Phone  Notes  [place]. [address], [place], [place] [phone]     [facility] [address] [place], [place]   ADS: [facility] Drug [facility] [address] [place], [place]   [place] [address]  [place], [place] or [phone]   Substance Abuse Resources Organization         Address  Phone  Notes  Alcohol and Drug Services  [phone]   [place]  [phone]   [facility] [place]   Chinita [place]  [phone]   Residential & Outpatient Substance Abuse Program  [phone]   Psychological Services Organization         Address  Phone  Notes  [facility] [place]  [place]  [phone]   [place] [address], [place] or [phone]    Mobile Crisis Teams Organization         Address  Phone  Notes  [facility], Mobile Crisis Care Unit  [phone]   [facility]  [address] [place], [place]   [place] [address], [place] [phone]    Self-Help/Support Groups Organization         Address  Phone             Notes  [place]. of [place] - variety of support groups  [place] Call for more information  Narcotics Anonymous (NA), [facility] [address], [employer] [place]  2 meetings at this location   Special educational needs teacher         Address  Phone  Notes  [facility] Residential Treatment [place],    [place]  [phone]   [facility]  [address] [place] [phone], [place], [place]   [place], [place] [phone] Admissions: 8am-3pm M-F  Incentives Substance [place] [address]    [place], [place] [phone]   [facility] [address] [place], [place], [place]   [facility] [address],  [place],  [place]   [facility] Programs - Intensive Outpatient [address] Kristeen Mans [address], [place], [place]   [facility] ([place].,  [place], [place] [phone] or [phone]   [facility] Services (RTS) [address] [place], [place] Accepts Medicaid  [facility] [place] [address],  [place] [phone] Substance Abuse/Addiction Treatment   [facility] Organization         Address  Phone  Notes  [facility]  [phone]   Domenic Schwab, PhD [address] Arlis [place], [place]   [phone] or [phone]   [place], [place] [phone]   [facility] [address] [place], [place] [phone] Insurance/Medicaid/sponsorship through [facility] Health and Families [address], [address] [place]                                    [place], [place] [phone] [place] [address], [place] [phone]    Dr. Adele Schilder  [phone]   [facility] Dept. 1) [address] [place] 2) [place] 3)  [place] [address], [place] [phone]  [phone]   [place] [phone] or [phone] (After Hours)

## 2015-12-04 NOTE — ED Notes (Signed)
PA-C at bedside 

## 2015-12-04 NOTE — Telephone Encounter (Signed)
Attempted to reach patient to discuss using home monitor and WireT rather than coming into office for ICD check.  Home number out of service and no answer on cell phone number.  Will plan to keep appointment today with device clinic.

## 2015-12-04 NOTE — Progress Notes (Signed)
Pacemaker check in clinic. Normal device function. Thresholds, sensing, impedances consistent with previous measurements. Device programmed to maximize longevity. No high ventricular rates noted. Device programmed at appropriate safety margins. Histogram distribution appropriate for patient activity level. Device programmed to optimize intrinsic conduction. Battery voltage 3.01V, RRT at 2.63V. Patient education completed, including use of WireT and [name] Moreno (patient will try both to see which works better in his home). Carelink on 03/07/16 and ROV with GT in 04/2016.
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
## 2015-12-04 NOTE — ED Notes (Signed)
Pt reports left eye drainage and dry cough starting this morning.

## 2015-12-10 ENCOUNTER — Encounter: Payer: Self-pay | Admitting: Family Medicine

## 2016-02-15 ENCOUNTER — Other Ambulatory Visit: Payer: Self-pay | Admitting: Hematology and Oncology

## 2016-02-15 ENCOUNTER — Telehealth: Payer: Self-pay | Admitting: Hematology and Oncology

## 2016-02-15 NOTE — Telephone Encounter (Signed)
lvm for pt regarding to May appt moved to June

## 2016-03-07 ENCOUNTER — Telehealth: Payer: Self-pay | Admitting: Cardiology

## 2016-03-07 ENCOUNTER — Encounter: Payer: Medicare Other | Admitting: *Deleted

## 2016-03-07 NOTE — Telephone Encounter (Signed)
Spoke with pt and reminded pt of remote transmission that is due today. Pt verbalized understanding.   

## 2016-03-11 ENCOUNTER — Encounter: Payer: Self-pay | Admitting: Cardiology

## 2016-04-07 ENCOUNTER — Ambulatory Visit: Payer: Medicare Other | Admitting: Hematology and Oncology

## 2016-04-07 ENCOUNTER — Other Ambulatory Visit: Payer: Medicare Other

## 2016-04-14 ENCOUNTER — Other Ambulatory Visit: Payer: Medicare Other

## 2016-04-14 ENCOUNTER — Encounter: Payer: Self-pay | Admitting: Hematology and Oncology

## 2016-04-14 ENCOUNTER — Ambulatory Visit: Payer: Medicare Other | Admitting: Hematology and Oncology

## 2016-05-21 ENCOUNTER — Other Ambulatory Visit: Payer: Self-pay | Admitting: Internal Medicine

## 2016-05-23 ENCOUNTER — Other Ambulatory Visit: Payer: Self-pay | Admitting: Internal Medicine

## 2016-06-21 ENCOUNTER — Other Ambulatory Visit: Payer: Self-pay | Admitting: Hematology and Oncology

## 2016-06-21 ENCOUNTER — Telehealth: Payer: Self-pay | Admitting: *Deleted

## 2016-06-21 NOTE — Telephone Encounter (Signed)
He had multiple no shows and cancellations and we sent out a no-show letter in June I placed POF for return next week. If he no-show again, I will not allow reschedule

## 2016-06-21 NOTE — Telephone Encounter (Signed)
Pt called to say he feels he needs a phlebotomy. Had cancelled May appointment, was unaware of June appts for lab/MD/phlebotomy

## 2016-06-22 ENCOUNTER — Telehealth: Payer: Self-pay | Admitting: *Deleted

## 2016-06-22 NOTE — Telephone Encounter (Signed)
Pt left VM asking for phlebotomy to be done next Wed after 10 am.   Dr. Alvy Bimler sent POF yesterday to schedule pt.   Transferred VM to Scheduler at [phone].

## 2016-06-23 ENCOUNTER — Telehealth: Payer: Self-pay | Admitting: Hematology

## 2016-06-23 NOTE — Telephone Encounter (Signed)
Spoke with pt to confirm 8/15 appts at 230 pm per pof

## 2016-06-28 ENCOUNTER — Ambulatory Visit (HOSPITAL_BASED_OUTPATIENT_CLINIC_OR_DEPARTMENT_OTHER): Payer: Medicare Other | Admitting: Hematology and Oncology

## 2016-06-28 ENCOUNTER — Encounter: Payer: Self-pay | Admitting: Hematology and Oncology

## 2016-06-28 ENCOUNTER — Ambulatory Visit (HOSPITAL_BASED_OUTPATIENT_CLINIC_OR_DEPARTMENT_OTHER): Payer: Medicare Other

## 2016-06-28 ENCOUNTER — Telehealth: Payer: Self-pay | Admitting: *Deleted

## 2016-06-28 ENCOUNTER — Other Ambulatory Visit (HOSPITAL_BASED_OUTPATIENT_CLINIC_OR_DEPARTMENT_OTHER): Payer: Medicare Other

## 2016-06-28 ENCOUNTER — Telehealth: Payer: Self-pay | Admitting: Internal Medicine

## 2016-06-28 VITALS — BP 123/76 | HR 56 | Temp 97.8°F | Resp 16

## 2016-06-28 VITALS — BP 126/74 | HR 63 | Temp 97.6°F | Resp 18 | Ht 66.0 in | Wt 179.1 lb

## 2016-06-28 DIAGNOSIS — G4730 Sleep apnea, unspecified: Secondary | ICD-10-CM | POA: Diagnosis not present

## 2016-06-28 DIAGNOSIS — D751 Secondary polycythemia: Secondary | ICD-10-CM

## 2016-06-28 DIAGNOSIS — D696 Thrombocytopenia, unspecified: Secondary | ICD-10-CM

## 2016-06-28 LAB — CBC WITH DIFFERENTIAL/PLATELET
BASO%: 0.5 % (ref 0.0–2.0)
BASOS ABS: 0 10*3/uL (ref 0.0–0.1)
EOS%: 1.4 % (ref 0.0–7.0)
Eosinophils Absolute: 0.1 10*3/uL (ref 0.0–0.5)
HEMATOCRIT: 50 % — AB (ref 38.4–49.9)
HGB: 17.5 g/dL — ABNORMAL HIGH (ref 13.0–17.1)
LYMPH#: 1.1 10*3/uL (ref 0.9–3.3)
LYMPH%: 15.2 % (ref 14.0–49.0)
MCH: 37.7 pg — AB (ref 27.2–33.4)
MCHC: 34.9 g/dL (ref 32.0–36.0)
MCV: 108 fL — ABNORMAL HIGH (ref 79.3–98.0)
MONO#: 0.5 10*3/uL (ref 0.1–0.9)
MONO%: 6.7 % (ref 0.0–14.0)
NEUT#: 5.4 10*3/uL (ref 1.5–6.5)
NEUT%: 76.2 % — AB (ref 39.0–75.0)
Platelets: 101 10*3/uL — ABNORMAL LOW (ref 140–400)
RBC: 4.63 10*6/uL (ref 4.20–5.82)
RDW: 12.3 % (ref 11.0–14.6)
WBC: 7 10*3/uL (ref 4.0–10.3)
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
## 2016-06-28 NOTE — Assessment & Plan Note (Signed)
Cause is unclear, could be due to could be due to hepatic congestion He is not symptomatic Recommend observation only We'll proceed with bone marrow biopsy as above

## 2016-06-28 NOTE — Progress Notes (Signed)
Therapeutic phlebotomy performed with 16G in right AC starting at 1445 and ending at 1451 yielding 518g. Pt tolerated well. Sandwich, chips and drink provided.

## 2016-06-28 NOTE — Telephone Encounter (Signed)
Per desk RN I have moved appts from 8/29 to 8/30. Desk RN to call the patient

## 2016-06-28 NOTE — Patient Instructions (Signed)
Therapeutic Phlebotomy, Care After  Refer to this sheet in the next few weeks. These instructions provide you with information about caring for yourself after your procedure. Your health care provider may also give you more specific instructions. Your treatment has been planned according to current medical practices, but problems sometimes occur. Call your health care provider if you have any problems or questions after your procedure.  WHAT TO EXPECT AFTER THE PROCEDURE  After your procedure, it is common to have:   Light-headedness or dizziness. You may feel faint.   Nausea.   Tiredness.  HOME CARE INSTRUCTIONS  Activities   Return to your normal activities as directed by your health care provider. Most people can go back to their normal activities right away.   Avoid strenuous physical activity and heavy lifting or pulling for about 5 hours after the procedure. Do not lift anything that is heavier than 10 lb (4.5 kg).   Athletes should avoid strenuous exercise for at least 12 hours.   Change positions slowly for the remainder of the day. This will help to prevent light-headedness or fainting.   If you feel light-headed, lie down until the feeling goes away.  Eating and Drinking   Be sure to eat well-balanced meals for the next 24 hours.   Drink enough fluid to keep your urine clear or pale yellow.   Avoid drinking alcohol on the day that you had the procedure.  Care of the Needle Insertion Site   Keep your bandage dry. You can remove the bandage after about 5 hours or as directed by your health care provider.   If you have bleeding from the needle insertion site, elevate your arm and press firmly on the site until the bleeding stops.   If you have bruising at the site, apply ice to the area:   Put ice in a plastic bag.   Place a towel between your skin and the bag.   Leave the ice on for 20 minutes, 2-3 times a day for the first 24 hours.   If the swelling does not go away after 24 hours, apply  a warm, moist washcloth to the area for 20 minutes, 2-3 times a day.  General Instructions   Avoid smoking for at least 30 minutes after the procedure.   Keep all follow-up visits as directed by your health care provider. It is important to continue with further therapeutic phlebotomy treatments as directed.  SEEK MEDICAL CARE IF:   You have redness, swelling, or pain at the needle insertion site.   You have fluid, blood, or pus coming from the needle insertion site.   You feel light-headed, dizzy, or nauseated, and the feeling does not go away.   You notice new bruising at the needle insertion site.   You feel weaker than normal.   You have a fever or chills.  SEEK IMMEDIATE MEDICAL CARE IF:   You have severe nausea or vomiting.   You have chest pain.   You have trouble breathing.    This information is not intended to replace advice given to you by your health care provider. Make sure you discuss any questions you have with your health care provider.    Document Released: 04/04/2011 Document Revised: 03/17/2015 Document Reviewed: 10/27/2014  Elsevier Interactive Patient Education 2016 [employer]

## 2016-06-28 NOTE — Assessment & Plan Note (Addendum)
The patient had extensive workup in the past. He was diagnosed with obstructive sleep apnea and was noncompliant. We discussed potential further workup with a bone marrow aspirin biopsy and he agreed to proceed. I will proceed with phlebotomy treatment today to keep hemoglobin less than 16 g He will continue aspirin therapy.

## 2016-06-28 NOTE — Telephone Encounter (Signed)
GAVE PATIENT AVS REPORT AND APPOINTMENTS FOR AUGUST.  °

## 2016-07-11 ENCOUNTER — Other Ambulatory Visit (HOSPITAL_COMMUNITY)
Admission: RE | Admit: 2016-07-11 | Discharge: 2016-07-11 | Disposition: A | Discharge status: Home / Self Care | Payer: Medicare Other | Source: Ambulatory Visit | Attending: Hematology and Oncology | Admitting: Hematology and Oncology

## 2016-07-11 ENCOUNTER — Encounter: Payer: Self-pay | Admitting: Internal Medicine

## 2016-07-11 ENCOUNTER — Telehealth: Payer: Self-pay | Admitting: Hematology and Oncology

## 2016-07-11 ENCOUNTER — Ambulatory Visit (HOSPITAL_BASED_OUTPATIENT_CLINIC_OR_DEPARTMENT_OTHER): Payer: Medicare Other | Admitting: Hematology and Oncology

## 2016-07-11 ENCOUNTER — Encounter: Payer: Self-pay | Admitting: Hematology and Oncology

## 2016-07-11 ENCOUNTER — Other Ambulatory Visit (HOSPITAL_BASED_OUTPATIENT_CLINIC_OR_DEPARTMENT_OTHER): Payer: Medicare Other

## 2016-07-11 ENCOUNTER — Ambulatory Visit (INDEPENDENT_AMBULATORY_CARE_PROVIDER_SITE_OTHER): Payer: Medicare Other | Admitting: *Deleted

## 2016-07-11 VITALS — BP 132/90 | HR 79 | Temp 97.8°F | Resp 17

## 2016-07-11 DIAGNOSIS — D696 Thrombocytopenia, unspecified: Secondary | ICD-10-CM | POA: Diagnosis not present

## 2016-07-11 DIAGNOSIS — Z95810 Presence of automatic (implantable) cardiac defibrillator: Secondary | ICD-10-CM | POA: Diagnosis not present

## 2016-07-11 DIAGNOSIS — D751 Secondary polycythemia: Secondary | ICD-10-CM

## 2016-07-11 DIAGNOSIS — I5032 Chronic diastolic (congestive) heart failure: Secondary | ICD-10-CM | POA: Diagnosis not present

## 2016-07-11 LAB — CUP PACEART INCLINIC DEVICE CHECK
Battery Voltage: 2.99 V
HIGH POWER IMPEDANCE MEASURED VALUE: 50 Ohm
HighPow Impedance: 304 Ohm
HighPow Impedance: 40 Ohm
Implantable Lead Implant Date: 20120711
Implantable Lead Location: 753860
Implantable Lead Model: 184
Lead Channel Pacing Threshold Amplitude: 1.25 V
Lead Channel Pacing Threshold Pulse Width: 0.4 ms
Lead Channel Setting Pacing Amplitude: 2.5 V
MDC IDC LEAD SERIAL: 324224
MDC IDC MSMT LEADCHNL RV IMPEDANCE VALUE: 304 Ohm
MDC IDC MSMT LEADCHNL RV SENSING INTR AMPL: 15.625 mV
MDC IDC MSMT LEADCHNL RV SENSING INTR AMPL: 23.625 mV
MDC IDC SESS DTM: 20170828124519
MDC IDC SET LEADCHNL RV PACING PULSEWIDTH: 0.4 ms
MDC IDC SET LEADCHNL RV SENSING SENSITIVITY: 0.3 mV
MDC IDC STAT BRADY RV PERCENT PACED: 0.01 %

## 2016-07-11 LAB — CBC WITH DIFFERENTIAL/PLATELET
BASO%: 0.6 % (ref 0.0–2.0)
BASOS ABS: 0 10*3/uL (ref 0.0–0.1)
EOS%: 2 % (ref 0.0–7.0)
Eosinophils Absolute: 0.1 10*3/uL (ref 0.0–0.5)
HCT: 46.1 % (ref 38.4–49.9)
HEMOGLOBIN: 16.2 g/dL (ref 13.0–17.1)
LYMPH#: 1 10*3/uL (ref 0.9–3.3)
LYMPH%: 15.5 % (ref 14.0–49.0)
MCH: 37.9 pg — AB (ref 27.2–33.4)
MCHC: 35.2 g/dL (ref 32.0–36.0)
MCV: 107.5 fL — AB (ref 79.3–98.0)
MONO#: 0.5 10*3/uL (ref 0.1–0.9)
MONO%: 7.8 % (ref 0.0–14.0)
NEUT#: 4.6 10*3/uL (ref 1.5–6.5)
NEUT%: 74.1 % (ref 39.0–75.0)
PLATELETS: 96 10*3/uL — AB (ref 140–400)
RBC: 4.29 10*6/uL (ref 4.20–5.82)
RDW: 12.8 % (ref 11.0–14.6)
WBC: 6.2 10*3/uL (ref 4.0–10.3)

## 2016-07-11 LAB — BONE MARROW EXAM

## 2016-07-11 NOTE — Telephone Encounter (Signed)
[name] Moreno stopped by after his bmbx. Per Mr. Lamke Dr. Alvy Bimler wanted to see him in 10 days, however he is going back to [place] and will not return until November. He requested a follow up for when he returns. I gave him a follow up for 11/20.   Message to Dr. Alvy Bimler to confirm. Per Mr. Juntunen he spoke with Dr. Alvy Bimler re returning to [place].
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
## 2016-07-11 NOTE — Assessment & Plan Note (Signed)
The patient had extensive workup in the past. He was diagnosed with obstructive sleep apnea and was noncompliant. We discussed potential further workup with a bone marrow aspirin biopsy and he agreed to proceed. I will proceed with phlebotomy treatment today to keep hemoglobin less than 16 g He will continue aspirin therapy.  Bone Marrow Biopsy and Aspiration Procedure Note   Informed consent was obtained and potential risks including bleeding, infection and pain were reviewed with the patient. The patient's name, date of birth, identification, consent and allergies were verified prior to the start of procedure and time out was performed.  The right posterior iliac crest was chosen as the site of biopsy.  The skin was prepped with Betadine solution.   8 cc of 1% lidocaine was used to provide local anaesthesia.   10 cc of bone marrow aspirate was obtained followed by 1 inch biopsy.   The procedure was tolerated well and there were no complications.  The patient was stable at the end of the procedure.  Specimens sent for flow cytometry, cytogenetics and additional studies.

## 2016-07-11 NOTE — Progress Notes (Signed)
Therapeutic phlebotomy performed in right AC using an 18 gauge needle.  573 grams removed in approximately 8 minutes.  Pt tolerated well.  Pt had snack and drink before and after phlebotomy.  Observed 30 minutes and discharged

## 2016-07-11 NOTE — Progress Notes (Signed)
ICD check in clinic. Normal device function. Threshold and sensing consistent with previous device measurements. Impedance trends stable over time. No evidence of any ventricular arrhythmias. Histogram distribution appropriate for patient and level of activity. No changes made this session. Device programmed at appropriate safety margins. Device programmed to optimize intrinsic conduction. Battery @ 2.99V (RRT=2.63V). Pt enrolled in remote follow-up- multiple attempts to send transmissions with [facility]- he will call [facility] for troubleshooting. Plan to check device every 3 months remotely and in office annually. ROV with [facility] in December.

## 2016-07-11 NOTE — Patient Instructions (Signed)
Therapeutic Phlebotomy Therapeutic phlebotomy is the controlled removal of blood from a person's body for the purpose of treating a medical condition. The procedure is similar to donating blood. Usually, about a pint (470 mL, or 0.47L) of blood is removed. The average adult has 9-12 pints (4.3-5.7 L) of blood. Therapeutic phlebotomy may be used to treat the following medical conditions:  Hemochromatosis. This is a condition in which the blood contains too much iron.  Polycythemia vera. This is a condition in which the blood contains too many red blood cells.  Porphyria cutanea tarda. This is a disease in which an important part of hemoglobin is not made properly. It results in the buildup of abnormal amounts of porphyrins in the body.  Sickle cell disease. This is a condition in which the red blood cells form an abnormal crescent shape rather than a round shape. LET [facility] CARE PROVIDER KNOW ABOUT:  Any allergies you have.  All medicines you are taking, including vitamins, herbs, eye drops, creams, and over-the-counter medicines.  Previous problems you or members of your family have had with the use of anesthetics.  Any blood disorders you have.  Previous surgeries you have had.  Any medical conditions you may have. RISKS AND COMPLICATIONS Generally, this is a safe procedure. However, problems may occur, including:  Nausea or light-headedness.  Low blood pressure.  Soreness, bleeding, swelling, or bruising at the needle insertion site.  Infection. BEFORE THE PROCEDURE  Follow instructions from your health care provider about eating or drinking restrictions.  Ask your health care provider about changing or stopping your regular medicines. This is especially important if you are taking diabetes medicines or blood thinners.  Wear clothing with sleeves that can be raised above the elbow.  Plan to have someone take you home after the procedure.  You may have a blood sample  taken. PROCEDURE  A needle will be inserted into one of your veins.  Tubing and a collection bag will be attached to that needle.  Blood will flow through the needle and tubing into the collection bag.  You may be asked to open and close your hand slowly and continually during the entire collection.  After the specified amount of blood has been removed from your body, the collection bag and tubing will be clamped.  The needle will be removed from your vein.  Pressure will be held on the site of the needle insertion to stop the bleeding.  A bandage (dressing) will be placed over the needle insertion site. The procedure may vary among health care providers and hospitals. AFTER THE PROCEDURE  Your recovery will be assessed and monitored.  You can return to your normal activities as directed by your health care provider.   This information is not intended to replace advice given to you by your health care provider. Make sure you discuss any questions you have with your health care provider.   Document Released: 04/04/2011 Document Revised: 03/17/2015 Document Reviewed: 10/27/2014 Elsevier Interactive Patient Education 2016 [place].    Bone Marrow Aspiration and Bone Marrow Biopsy Bone marrow aspiration and bone marrow biopsy are procedures that are done to diagnose blood disorders. You may also have one of these procedures to help diagnose infections or some types of cancer. Bone marrow is the soft tissue that is inside your bones. Blood cells are produced in bone marrow. For bone marrow aspiration, a sample of tissue in liquid form is removed from inside your bone. For a bone marrow  biopsy, a small core of bone marrow tissue is removed. Then these samples are examined under a microscope or tested in a lab. You may need these procedures if you have an abnormal complete blood count (CBC). The aspiration or biopsy sample is usually taken from the top of your hip bone. Sometimes, an  aspiration sample is taken from your chest bone (sternum). LET [facility] CARE PROVIDER KNOW ABOUT:  Any allergies you have.  All medicines you are taking, including vitamins, herbs, eye drops, creams, and over-the-counter medicines.  Previous problems you or members of your family have had with the use of anesthetics.  Any blood disorders you have.  Previous surgeries you have had.  Any medical conditions you may have.  Whether you are pregnant or you think that you may be pregnant. RISKS AND COMPLICATIONS Generally, this is a safe procedure. However, problems may occur, including:  Infection.  Bleeding. BEFORE THE PROCEDURE  Ask your health care provider about:  Changing or stopping your regular medicines. This is especially important if you are taking diabetes medicines or blood thinners.  Taking medicines such as aspirin and ibuprofen. These medicines can thin your blood. Do not take these medicines before your procedure if your health care provider instructs you not to.  Plan to have someone take you home after the procedure.  If you go home right after the procedure, plan to have someone with you for 24 hours. PROCEDURE   An IV tube may be inserted into one of your veins.  The injection site will be cleaned with a germ-killing solution (antiseptic).  You will be given one or more of the following:  A medicine that helps you relax (sedative).  A medicine that numbs the area (local anesthetic).  The bone marrow sample will be removed as follows:  For an aspiration, a hollow needle will be inserted through your skin and into your bone. Bone marrow fluid will be drawn up into a syringe.  For a biopsy, your health care provider will use a hollow needle to remove a core of tissue from your bone marrow.  The needle will be removed.  A bandage (dressing) will be placed over the insertion site and taped in place. The procedure may vary among health care providers  and hospitals. AFTER THE PROCEDURE  Your blood pressure, heart rate, breathing rate, and blood oxygen level will be monitored often until the medicines you were given have worn off.  Return to your normal activities as directed by your health care provider.   This information is not intended to replace advice given to you by your health care provider. Make sure you discuss any questions you have with your health care provider.   Document Released: 11/03/2004 Document Revised: 03/17/2015 Document Reviewed: 10/22/2014 Elsevier Interactive Patient Education [employer].

## 2016-07-11 NOTE — Assessment & Plan Note (Signed)
Cause is unclear, could be due to could be due to hepatic congestion He is not symptomatic Recommend observation only We'll proceed with bone marrow biopsy as above

## 2016-07-13 ENCOUNTER — Other Ambulatory Visit: Payer: Medicare Other

## 2016-07-18 ENCOUNTER — Encounter: Payer: Self-pay | Admitting: Hematology and Oncology

## 2016-07-19 LAB — CHROMOSOME ANALYSIS, BONE MARROW

## 2016-07-20 ENCOUNTER — Telehealth: Payer: Self-pay | Admitting: Hematology and Oncology

## 2016-07-20 NOTE — Telephone Encounter (Signed)
I reviewed the bone marrow report with the patient's today. Bone marrow biopsy showed no evidence of polycythemia vera. Cytogenetics were normal. JAK 2 mutation is negative. I suspect the cause of his polycythemia is due to undue treated obstructive sleep apnea. The patient can proceed with blood donation in the future. The patient has moved to [place] and does not want return appointment which I think is reasonable. I addressed all his questions and concerns and I have cancelled his appointment in November

## 2016-07-26 ENCOUNTER — Encounter (HOSPITAL_COMMUNITY): Payer: Self-pay

## 2016-10-03 ENCOUNTER — Ambulatory Visit: Payer: Medicare Other | Admitting: Hematology and Oncology

## 2016-10-26 ENCOUNTER — Encounter: Payer: Self-pay | Admitting: Internal Medicine

## 2016-10-31 ENCOUNTER — Encounter: Payer: Medicare Other | Admitting: Internal Medicine

## 2016-11-01 ENCOUNTER — Encounter: Payer: Self-pay | Admitting: Internal Medicine

## 2016-11-08 IMAGING — CT CT RENAL STONE PROTOCOL
2 of 4 series · 9 of 46 positions shown, 10 images · non-contrast
Comparison: CT abdomen and pelvis March 29, 2015

CLINICAL DATA: RIGHT flank pain. History of chronic renal
insufficiency, polycythemia, cardio myopathy.

EXAM:
CT ABDOMEN AND PELVIS WITHOUT CONTRAST
TECHNIQUE: Multidetector CT imaging of the abdomen and pelvis was performed
following the standard protocol without IV contrast.

[Series 201: stone study, idose (2) · axial · 0.79mm/px · z∈[-200,+250]mm · 6 of 114 slices shown, 7 images]
[im 14/114  soft-tissue]
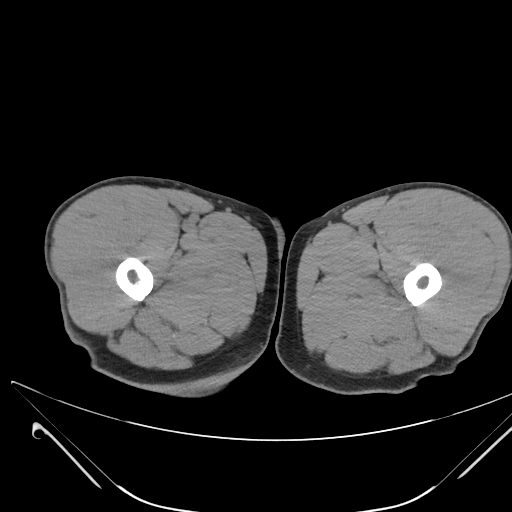
[im 14/114  bone]
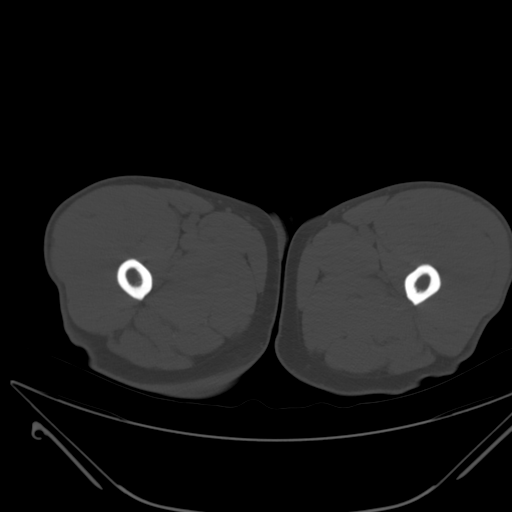
[im 32/114  soft-tissue]
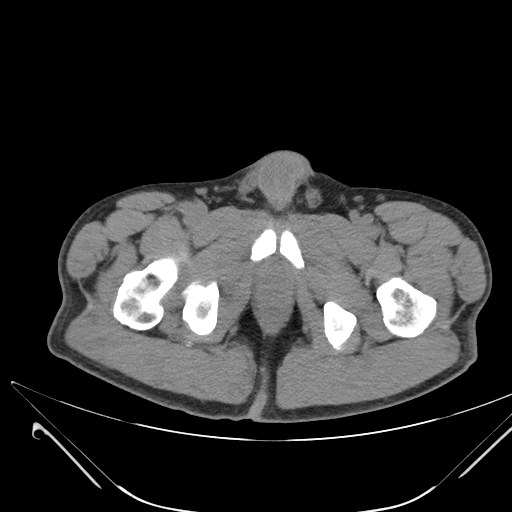
[im 50/114  soft-tissue]
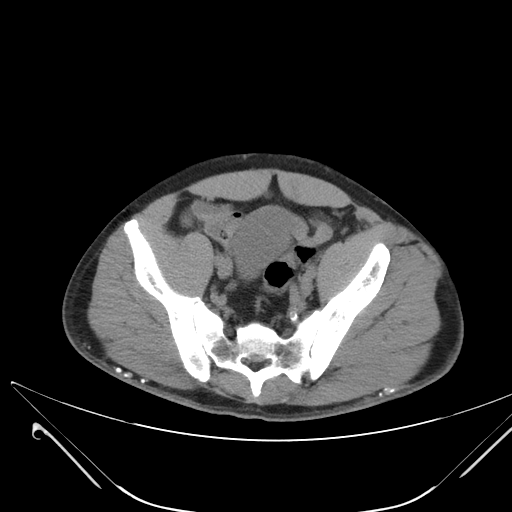
[im 68/114  soft-tissue]
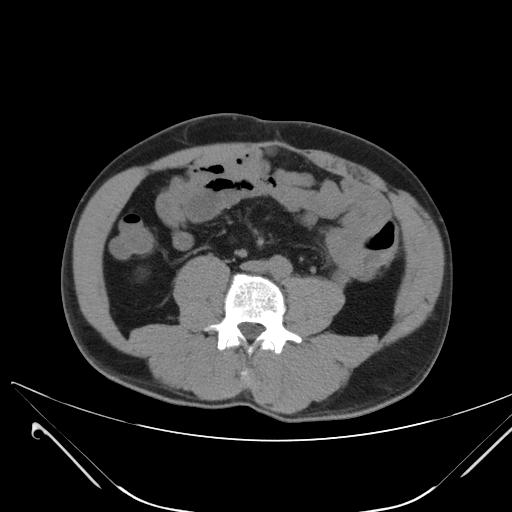
[im 86/114  soft-tissue]
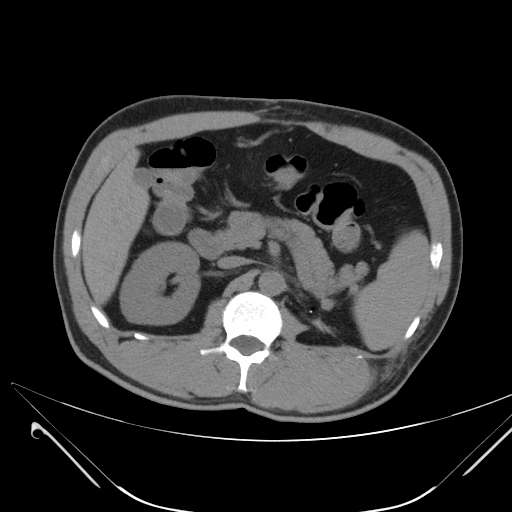
[im 104/114  soft-tissue]
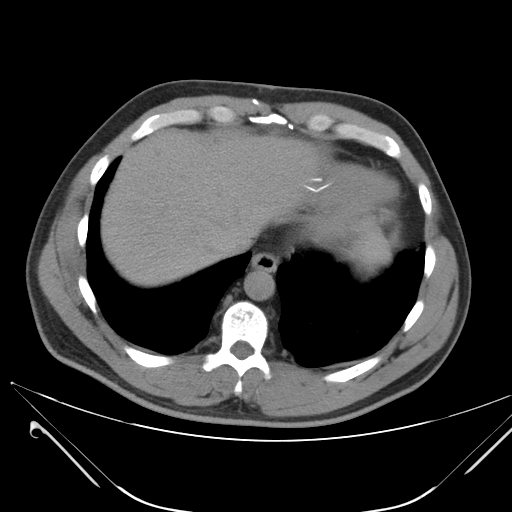

[Series 203: coronals, idose (2) · coronal · 0.45mm/px · 3 of 121 slices shown]
[im 41/121  soft-tissue]
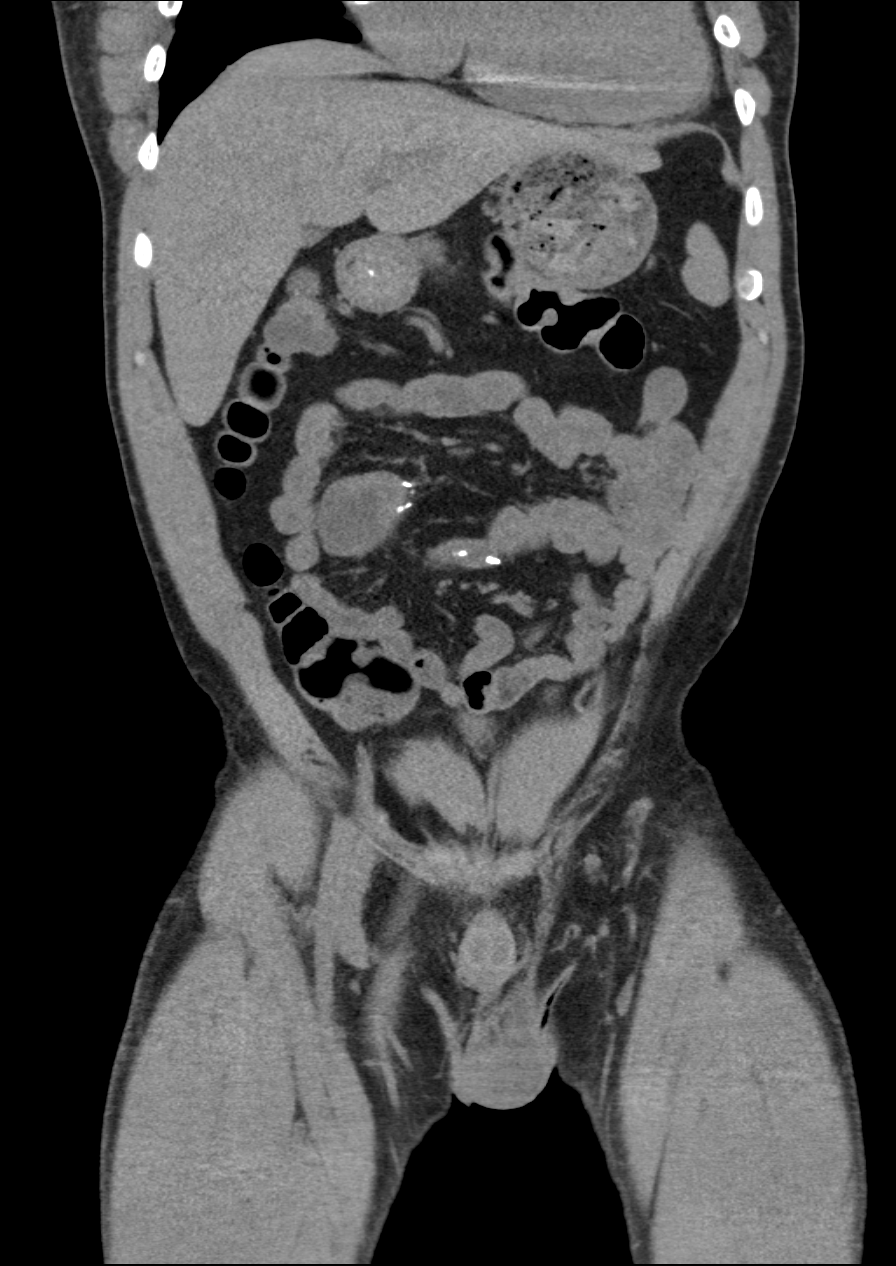
[im 54/121  soft-tissue]
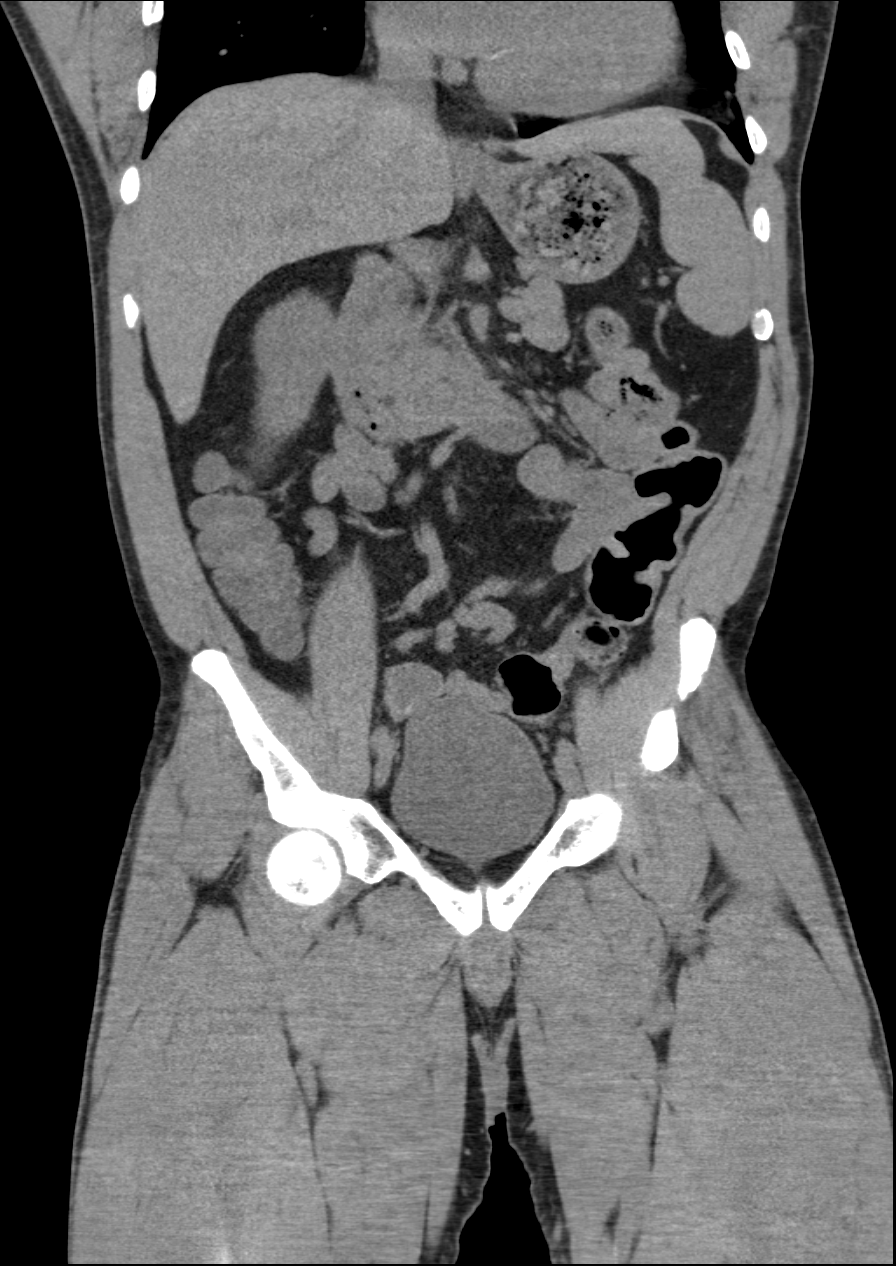
[im 67/121  soft-tissue]
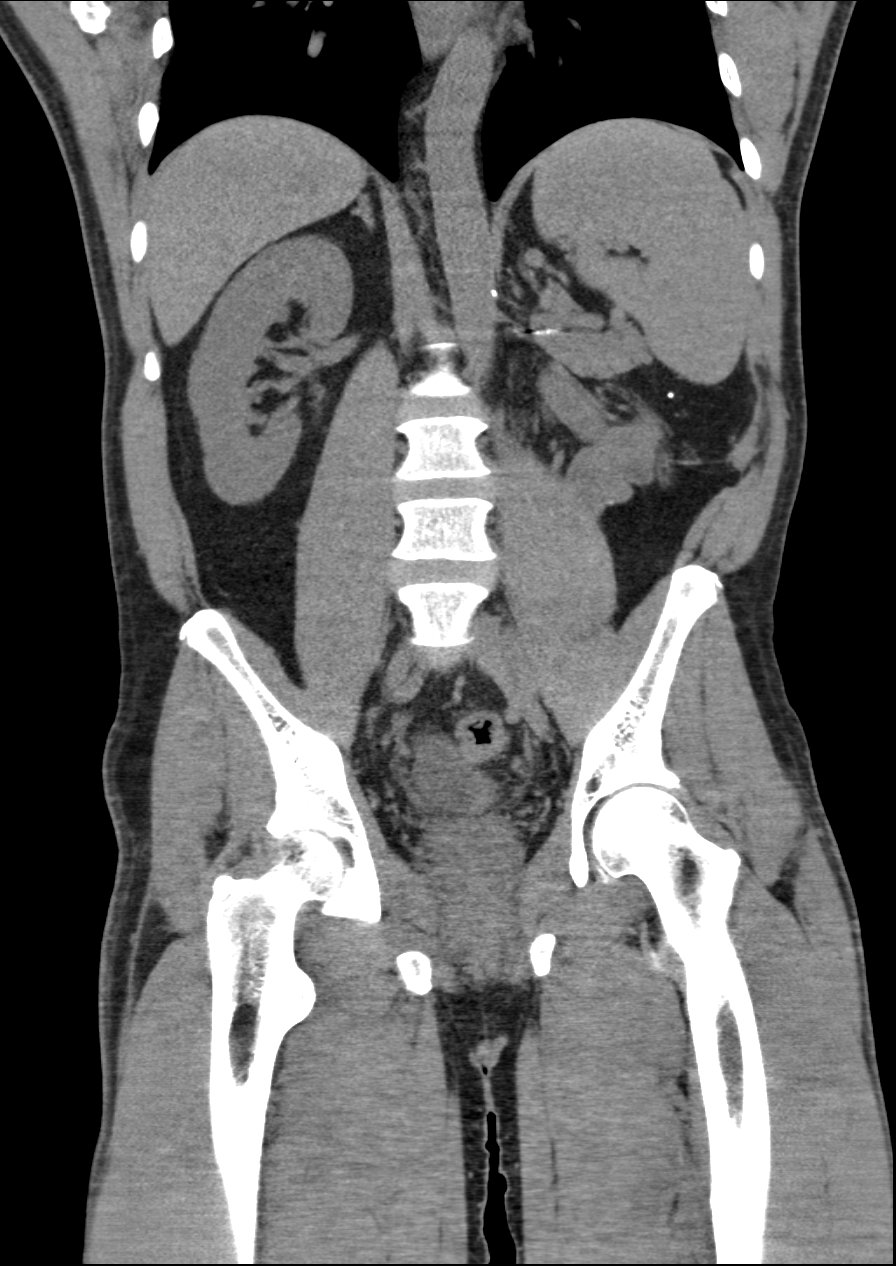

[9 of 46 positions shown; findings below may reference images not displayed]

FINDINGS: LUNG BASES: Included heart size is moderately enlarged with pacer
wires. Status post median sternotomy. No pericardial fluid
collections. Lung bases are clear.

KIDNEYS/BLADDER: RIGHT kidney is orthotopic, demonstrating normal
size and morphology. Status post LEFT nephrectomy. No
nephrolithiasis, hydronephrosis; limited assessment for renal masses
on this nonenhanced examination. Urinary bladder is well distended
unremarkable.

SOLID ORGANS: The liver, gallbladder, pancreas and adrenal glands
are unremarkable for this non-contrast examination. Spleen is
approximate 12 cm in cranial caudad dimension, unchanged.

GASTROINTESTINAL TRACT: The stomach, small and large bowel are
normal in course and caliber without inflammatory changes, the
sensitivity may be decreased by lack of enteric contrast. Descending
colonic bowel anastomosis. Surgical clips in small bowel. Normal
appendix.

PERITONEUM/RETROPERITONEUM: Aortoiliac vessels are normal in course
and caliber. No lymphadenopathy by CT size criteria. Prostate size
is normal. No intraperitoneal free fluid nor free air.

SOFT TISSUES/ OSSEOUS STRUCTURES: Nonsuspicious. Anterior abdominal
wall scarring and herniorrhaphy. Bilateral calcified gluteal
injection granulomas. Small L4-5 L5-S1 broad-based disc bulges.
IMPRESSION: Status post LEFT nephrectomy. No RIGHT nephrolithiasis or
obstructive uropathy.

No acute intra-abdominal or pelvic process.

## 2017-01-26 ENCOUNTER — Encounter: Payer: Self-pay | Admitting: Cardiology

## 2017-05-05 ENCOUNTER — Other Ambulatory Visit: Payer: Self-pay | Admitting: Nurse Practitioner

## 2017-07-15 ENCOUNTER — Other Ambulatory Visit: Payer: Self-pay | Admitting: Nurse Practitioner
